# Patient Record
Sex: Female | Born: 1960 | Race: White | Hispanic: No | Marital: Married | Smoking: Never smoker
Health system: Southern US, Community
[De-identification: ages and names within clinical notes are randomized; demographics above are authoritative.]

## PROBLEM LIST (undated history)

## (undated) DIAGNOSIS — I10 Essential (primary) hypertension: Secondary | ICD-10-CM

## (undated) DIAGNOSIS — F32A Depression, unspecified: Secondary | ICD-10-CM

## (undated) DIAGNOSIS — K219 Gastro-esophageal reflux disease without esophagitis: Secondary | ICD-10-CM

## (undated) DIAGNOSIS — E669 Obesity, unspecified: Secondary | ICD-10-CM

## (undated) DIAGNOSIS — M5431 Sciatica, right side: Secondary | ICD-10-CM

## (undated) DIAGNOSIS — F329 Major depressive disorder, single episode, unspecified: Secondary | ICD-10-CM

## (undated) DIAGNOSIS — M1990 Unspecified osteoarthritis, unspecified site: Secondary | ICD-10-CM

## (undated) DIAGNOSIS — E119 Type 2 diabetes mellitus without complications: Secondary | ICD-10-CM

## (undated) DIAGNOSIS — F419 Anxiety disorder, unspecified: Secondary | ICD-10-CM

## (undated) DIAGNOSIS — G629 Polyneuropathy, unspecified: Secondary | ICD-10-CM

## (undated) DIAGNOSIS — G43909 Migraine, unspecified, not intractable, without status migrainosus: Secondary | ICD-10-CM

## (undated) DIAGNOSIS — E785 Hyperlipidemia, unspecified: Secondary | ICD-10-CM

## (undated) DIAGNOSIS — R011 Cardiac murmur, unspecified: Secondary | ICD-10-CM

## (undated) HISTORY — DX: Type 2 diabetes mellitus without complications: E11.9

## (undated) HISTORY — DX: Sciatica, right side: M54.31

## (undated) HISTORY — PX: KNEE SURGERY: SHX244

## (undated) HISTORY — DX: Migraine, unspecified, not intractable, without status migrainosus: G43.909

## (undated) HISTORY — DX: Major depressive disorder, single episode, unspecified: F32.9

## (undated) HISTORY — DX: Depression, unspecified: F32.A

## (undated) HISTORY — PX: NASAL SEPTOPLASTY W/ TURBINOPLASTY: SHX2070

## (undated) HISTORY — DX: Obesity, unspecified: E66.9

## (undated) HISTORY — DX: Hyperlipidemia, unspecified: E78.5

## (undated) HISTORY — DX: Unspecified osteoarthritis, unspecified site: M19.90

## (undated) HISTORY — DX: Anxiety disorder, unspecified: F41.9

---

## 1997-10-04 ENCOUNTER — Other Ambulatory Visit: Admission: RE | Admit: 1997-10-04 | Discharge: 1997-10-04 | Payer: Self-pay | Admitting: Obstetrics and Gynecology

## 2010-11-04 ENCOUNTER — Emergency Department (HOSPITAL_COMMUNITY)
Admission: EM | Admit: 2010-11-04 | Discharge: 2010-11-05 | Disposition: A | Discharge status: Home / Self Care | Payer: BC Managed Care – PPO | Attending: Emergency Medicine | Admitting: Emergency Medicine

## 2010-11-04 DIAGNOSIS — R109 Unspecified abdominal pain: Secondary | ICD-10-CM | POA: Insufficient documentation

## 2010-11-04 DIAGNOSIS — N200 Calculus of kidney: Secondary | ICD-10-CM | POA: Insufficient documentation

## 2010-11-04 DIAGNOSIS — K7689 Other specified diseases of liver: Secondary | ICD-10-CM | POA: Insufficient documentation

## 2010-11-04 DIAGNOSIS — K449 Diaphragmatic hernia without obstruction or gangrene: Secondary | ICD-10-CM | POA: Insufficient documentation

## 2010-11-05 ENCOUNTER — Emergency Department (HOSPITAL_COMMUNITY): Payer: BC Managed Care – PPO

## 2010-11-05 LAB — URINALYSIS, ROUTINE W REFLEX MICROSCOPIC
Bilirubin Urine: NEGATIVE
Ketones, ur: NEGATIVE mg/dL
Nitrite: NEGATIVE
Protein, ur: NEGATIVE mg/dL
Specific Gravity, Urine: >1.03 — ABNORMAL HIGH (ref 1.005–1.030)
Urobilinogen, UA: 0.2 mg/dL (ref 0.0–1.0)

## 2010-11-05 LAB — URINE MICROSCOPIC-ADD ON

## 2010-11-05 LAB — PREGNANCY, URINE: Preg Test, Ur: NEGATIVE

## 2010-11-06 ENCOUNTER — Emergency Department (HOSPITAL_COMMUNITY)
Admission: EM | Admit: 2010-11-06 | Discharge: 2010-11-06 | Disposition: A | Discharge status: Home / Self Care | Payer: BC Managed Care – PPO | Attending: Emergency Medicine | Admitting: Emergency Medicine

## 2010-11-06 ENCOUNTER — Emergency Department (HOSPITAL_COMMUNITY): Payer: BC Managed Care – PPO

## 2010-11-06 DIAGNOSIS — N200 Calculus of kidney: Secondary | ICD-10-CM | POA: Insufficient documentation

## 2010-11-06 DIAGNOSIS — R109 Unspecified abdominal pain: Secondary | ICD-10-CM | POA: Insufficient documentation

## 2010-11-06 DIAGNOSIS — N12 Tubulo-interstitial nephritis, not specified as acute or chronic: Secondary | ICD-10-CM | POA: Insufficient documentation

## 2010-11-06 DIAGNOSIS — I10 Essential (primary) hypertension: Secondary | ICD-10-CM | POA: Insufficient documentation

## 2010-11-06 DIAGNOSIS — K7689 Other specified diseases of liver: Secondary | ICD-10-CM | POA: Insufficient documentation

## 2010-11-06 LAB — CBC
HCT: 39.3 % (ref 36.0–46.0)
Hemoglobin: 12.7 g/dL (ref 12.0–15.0)
MCH: 29.4 pg (ref 26.0–34.0)
MCHC: 32.3 g/dL (ref 30.0–36.0)
MCV: 91 fL (ref 78.0–100.0)
Platelets: 219 10*3/uL (ref 150–400)
RBC: 4.32 MIL/uL (ref 3.87–5.11)
RDW: 14.1 % (ref 11.5–15.5)
WBC: 18 K/uL — ABNORMAL HIGH (ref 4.0–10.5)

## 2010-11-06 LAB — BASIC METABOLIC PANEL
CO2: 25 mEq/L (ref 19–32)
Calcium: 9.4 mg/dL (ref 8.4–10.5)
Chloride: 101 mEq/L (ref 96–112)
Creatinine, Ser: 0.93 mg/dL (ref 0.4–1.2)
GFR calc Af Amer: >60 mL/min (ref >60)
Sodium: 134 mEq/L — ABNORMAL LOW (ref 135–145)

## 2010-11-06 LAB — URINE MICROSCOPIC-ADD ON

## 2010-11-06 LAB — URINALYSIS, ROUTINE W REFLEX MICROSCOPIC
Glucose, UA: NEGATIVE mg/dL
Specific Gravity, Urine: 1.03 (ref 1.005–1.030)
pH: 5.5 (ref 5.0–8.0)

## 2010-11-06 LAB — BASIC METABOLIC PANEL WITH GFR
BUN: 14 mg/dL (ref 6–23)
GFR calc non Af Amer: >60 mL/min (ref >60)
Glucose, Bld: 120 mg/dL — ABNORMAL HIGH (ref 70–99)
Potassium: 4.4 meq/L (ref 3.5–5.1)

## 2010-11-06 LAB — DIFFERENTIAL
Basophils Absolute: 0 10*3/uL (ref 0.0–0.1)
Basophils Relative: 0 % (ref 0–1)
Eosinophils Absolute: 0.1 10*3/uL (ref 0.0–0.7)
Eosinophils Relative: 0 % (ref 0–5)
Lymphocytes Relative: 10 % — ABNORMAL LOW (ref 12–46)
Lymphs Abs: 1.7 K/uL (ref 0.7–4.0)
Monocytes Absolute: 1.1 K/uL — ABNORMAL HIGH (ref 0.1–1.0)
Monocytes Relative: 6 % (ref 3–12)
Neutro Abs: 15.1 10*3/uL — ABNORMAL HIGH (ref 1.7–7.7)
Neutrophils Relative %: 84 % — ABNORMAL HIGH (ref 43–77)

## 2010-11-08 LAB — URINE CULTURE
Colony Count: >=100000
Culture  Setup Time: 201205302038

## 2011-05-04 ENCOUNTER — Ambulatory Visit: Payer: PRIVATE HEALTH INSURANCE | Attending: Family Medicine | Admitting: Sleep Medicine

## 2011-05-04 DIAGNOSIS — Z6841 Body Mass Index (BMI) 40.0 and over, adult: Secondary | ICD-10-CM | POA: Insufficient documentation

## 2011-05-04 DIAGNOSIS — G4730 Sleep apnea, unspecified: Secondary | ICD-10-CM

## 2011-05-04 DIAGNOSIS — G4733 Obstructive sleep apnea (adult) (pediatric): Secondary | ICD-10-CM | POA: Insufficient documentation

## 2011-05-10 NOTE — Procedures (Signed)
NAMEDELLIE, PIASECKI               ACCOUNT NO.:  [id number]  MEDICAL RECORD NO.:  [id number]          PATIENT TYPE:  OUT  LOCATION:  SLEEP LAB                     FACILITY:  [facility]  PHYSICIAN:  Sharlon Pfohl A. Gerilyn Pilgrim, M.D. DATE OF BIRTH:  1960/09/14  DATE OF STUDY:  05/04/2011                           NOCTURNAL POLYSOMNOGRAM  REFERRING PHYSICIAN:  Claude Manges, MD  REFERRING PHYSICIAN:  Georges Mouse II, MD.  INDICATIONS:  A 50 year old, who presents with fatigue, hypersomnia, and snoring.  The study is being done to evaluate for obstructive sleep apnea syndrome.  MEDICATIONS:  Norvasc and benazepril.  EPWORTH SLEEPINESS SCALE:  14.  BMI:  45.  ARCHITECTURAL SUMMARY:  The total recording time is 490 minutes.  Sleep efficiency 89%.  Sleep latency 19.5 minutes.  REM latency 124.5 minutes. Stage N1 6.8%, N2 57% N3 18% and REM sleep also 18%.  RESPIRATORY SUMMARY:  Baseline oxygen saturation is 98, lowest saturation 87 during REM sleep.  Diagnostic AHI 6 and RDI also 6.  Most of the events occur during REM sleep with a REM AHI being 25.  LIMB MOVEMENT SUMMARY:  PLM index 0.  ELECTROCARDIOGRAM SUMMARY:  Average heart rate is 77 with no significant dysrhythmias observed.  IMPRESSION:  Mild obstructive sleep apnea syndrome, not requiring positive pressure treatment.  Thanks for this referral.    Abigale Dorow A. Gerilyn Pilgrim, M.D.    KAD/MEDQ  D:  05/09/2011 08:45:16  T:  05/10/2011 00:44:23  Job:  [id number]

## 2011-08-23 ENCOUNTER — Encounter (HOSPITAL_COMMUNITY): Payer: Self-pay | Admitting: *Deleted

## 2011-08-23 ENCOUNTER — Emergency Department (HOSPITAL_COMMUNITY): Payer: Worker's Compensation

## 2011-08-23 ENCOUNTER — Emergency Department (HOSPITAL_COMMUNITY)
Admission: EM | Admit: 2011-08-23 | Discharge: 2011-08-23 | Disposition: A | Discharge status: Home / Self Care | Payer: Worker's Compensation | Attending: Emergency Medicine | Admitting: Emergency Medicine

## 2011-08-23 DIAGNOSIS — Y990 Civilian activity done for income or pay: Secondary | ICD-10-CM | POA: Insufficient documentation

## 2011-08-23 DIAGNOSIS — S61209A Unspecified open wound of unspecified finger without damage to nail, initial encounter: Secondary | ICD-10-CM | POA: Insufficient documentation

## 2011-08-23 DIAGNOSIS — S61219A Laceration without foreign body of unspecified finger without damage to nail, initial encounter: Secondary | ICD-10-CM

## 2011-08-23 DIAGNOSIS — I10 Essential (primary) hypertension: Secondary | ICD-10-CM | POA: Insufficient documentation

## 2011-08-23 DIAGNOSIS — W278XXA Contact with other nonpowered hand tool, initial encounter: Secondary | ICD-10-CM | POA: Insufficient documentation

## 2011-08-23 DIAGNOSIS — Z79899 Other long term (current) drug therapy: Secondary | ICD-10-CM | POA: Insufficient documentation

## 2011-08-23 HISTORY — DX: Essential (primary) hypertension: I10

## 2011-08-23 MED ORDER — TETANUS-DIPHTH-ACELL PERTUSSIS 5-2.5-18.5 LF-MCG/0.5 IM SUSP
0.5000 mL | Freq: Once | INTRAMUSCULAR | Status: AC
  Administered 2011-08-23: 0.5 mL via INTRAMUSCULAR
  Filled 2011-08-23: qty 0.5

## 2011-08-23 NOTE — ED Notes (Signed)
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  No active bleeding huge bandage intact

## 2011-08-23 NOTE — ED Notes (Signed)
Patient complaining of a laceration on right pointer finger; patient states that she cut her finger on electric scissors at around around 0230 tonight.  Small laceration noted at tip of finger; no active bleeding noted.  Saline-soaked gauze placed on top of laceration.  Patient able to move all extremities without difficulty.  Patient rates pain 3/10 on the numerical pain scale; describes pain as "stinging".  Patient does not know the date of her last tetanus shot.  Patient alert and oriented x4; PERRL present.  Will continue to monitor.

## 2011-08-23 NOTE — ED Notes (Signed)
Lab at bedside for urine drug screen.

## 2011-08-23 NOTE — ED Notes (Signed)
Patient currently sitting up in bed; no respiratory or acute distress noted.  Patient updated on plan of care; informed patient that nurse practitioner will be in with dermabond to close laceration; patient has no other questions or concerns; will continue to monitor.

## 2011-08-23 NOTE — Discharge Instructions (Signed)
Please review the instructions below. The xray of your fingertip shows no foreign bodies or broken bones. We have repaired the small laceration with a tissue glue and steri-strips. Leave current dressing in place for 24 hours then remove and shower has usual. Keep covered, especially at work. Return to your primary doctor or to the emergency room if there are any concerns the wound is becoming infected.  Fingertip Laceration The treatment of fingertip injuries depends on how large the cut is and whether the bone or nail tissue has been damaged. Amputations of the skin over the tip of the finger that is smaller than a dime (smaller than 1cm) will usually heal very well from the sides without any treatment other than cleaning the wound and changing the dressing. Keep your hand elevated for the next 2 to 3 days to reduce pain and swelling. A splint over the fingertip may be needed to protect your injury. If your cut is being allowed to heal in from the sides, you should soak it in warm water and change the dressing daily.  You may need a tetanus shot if:  You cannot remember when you had your last tetanus shot.   You have never had a tetanus shot.   The injury broke your skin.  If you got a tetanus shot, your arm may swell, get red, and feel warm to the touch. This is common and not a problem. If you need a tetanus shot and you choose not to have one, there is a rare chance of getting tetanus. Sickness from tetanus can be serious. SEEK MEDICAL CARE IF:   There are any signs of infection: increased redness, swelling, and pain, or sometimes pus drainage.  Document Released: 07/03/2004 Document Revised: 05/15/2011 Document Reviewed: 06/29/2008 [facility] Patient Information 2012 [place], [place].

## 2011-08-23 NOTE — ED Notes (Signed)
Another dermabond removed from supply pyxis and given to [place], [place], per request.

## 2011-08-23 NOTE — ED Notes (Signed)
Dr. Wickline at bedside.  

## 2011-08-23 NOTE — ED Notes (Signed)
Patient given discharge paperwork; went over discharge instructions with patient.  Instructed patient to keep dressing in place for 24 hours, to shower normally afterwards, to keep wound clean and dry (covered, especially at work), and to follow up with primary care physician/ED for new, worsening, or concerning symptoms (i.e. Infection).

## 2011-08-23 NOTE — ED Notes (Signed)
Dermabond and steri-strips applied by Natalia Leatherwood, FNP.

## 2011-08-23 NOTE — ED Provider Notes (Signed)
History     CSN: [id number]  Arrival date & time 08/23/11  4098   First MD Initiated Contact with Patient 08/23/11 0440      Chief Complaint  Patient presents with  . Extremity Laceration    HPI: Patient is a 51 y.o. female presenting with skin laceration. The history is provided by the patient.  Laceration  The incident occurred 3 to 5 hours ago. The laceration is located on the right hand. The laceration is 1 cm in size. The pain is at a severity of 3/10. The pain is mild. The pain has been constant since onset. It is unknown if a foreign body is present. Her tetanus status is out of date.   patient reports that at approximately 2:45 this morning she cut the tip of her right index finger while using electric scissors at work. States that she feels like the scissors went very deep. Tetanus status unknown.  Past Medical History  Diagnosis Date  . Hypertension     History reviewed. No pertinent past surgical history.  History reviewed. No pertinent family history.  History  Substance Use Topics  . Smoking status: Never Smoker   . Smokeless tobacco: Not on file  . Alcohol Use: No    OB History    Grav Para Term Preterm Abortions TAB SAB Ect Mult Living                  Review of Systems  Constitutional: Negative.   HENT: Negative.   Eyes: Negative.   Respiratory: Negative.   Cardiovascular: Negative.   Gastrointestinal: Negative.   Genitourinary: Negative.   Musculoskeletal: Negative.   Skin: Negative.   Neurological: Negative.   Hematological: Negative.   Psychiatric/Behavioral: Negative.     Allergies  Review of patient's allergies indicates no known allergies.  Home Medications   Current Outpatient Rx  Name Route Sig Dispense Refill  . ASPIRIN-ACETAMINOPHEN-CAFFEINE 250-250-65 MG PO TABS Oral Take 1 tablet by mouth every 6 (six) hours as needed. For pain    . LISINOPRIL 10 MG PO TABS Oral Take 10 mg by mouth daily.      BP 173/96  Pulse 95   Temp(Src) 98.2 F (36.8 C) (Oral)  Resp 20  SpO2 96%  Physical Exam  Constitutional: She is oriented to person, place, and time. She appears well-developed and well-nourished.  HENT:  Head: Normocephalic and atraumatic.  Eyes: Conjunctivae are normal.  Cardiovascular: Normal rate and regular rhythm.   Pulmonary/Chest: Effort normal and breath sounds normal.  Abdominal: Soft. Bowel sounds are normal.  Musculoskeletal: Normal range of motion.       Small laceration that extends anterior/posterior from (but not including) the nailbed of distal (R) index fingertip. No active bleeding.  Neurological: She is alert and oriented to person, place, and time.  Skin: Skin is warm and dry. No erythema.  Psychiatric: She has a normal mood and affect.    ED Course  LACERATION REPAIR Date/Time: 08/23/2011 6:20 AM Performed by: Leanne Chang Authorized by: Leanne Chang Consent: Verbal consent obtained. Risks and benefits: risks, benefits and alternatives were discussed Consent given by: patient Patient understanding: patient states understanding of the procedure being performed Required items: required blood products, implants, devices, and special equipment available Patient identity confirmed: verbally with patient Body area: upper extremity Location details: right index finger Laceration length: 1 cm Foreign bodies: no foreign bodies Tendon involvement: none Nerve involvement: none Vascular damage: no Anesthetic total: 0 ml Irrigation solution:  saline Irrigation method: syringe Amount of cleaning: standard Debridement: none Degree of undermining: none Skin closure: Steri-Strips and glue Approximation: close Approximation difficulty: simple Dressing: pressure dressing Patient tolerance: Patient tolerated the procedure well with no immediate complications.       Labs Reviewed - No data to display No results found.   No diagnosis found.    MDM  HPI/PE and  clinical findings c/w 1. Fingertip laceration  (Xray shows no foreign bodies or fx, CMS intact,  T-Dap updated, repaired w/ derma-bond and steri strips)     Leanne Chang, NP 08/23/11 4540  Roma Kayser Shivaay Stormont, NP 08/23/11 [phone]

## 2011-08-23 NOTE — ED Notes (Signed)
Patient updated on plan of care; informed patient that lab is on their way to perform urine drug screen for worker's comp; patient has no other questions or concerns at this time; will continue to monitor.

## 2011-08-23 NOTE — ED Notes (Addendum)
Wound cleaned.

## 2011-08-23 NOTE — ED Notes (Signed)
FNP at bedside.

## 2011-08-25 NOTE — ED Provider Notes (Signed)
Medical screening examination/treatment/procedure(s) were conducted as a shared visit with non-physician practitioner(s) and myself.  I personally evaluated the patient during the encounter  Pt well appearing Advised use of skin glue Xray reviewed  Joya Gaskins, MD 08/25/11 [phone]

## 2012-11-09 ENCOUNTER — Emergency Department (HOSPITAL_COMMUNITY)
Admission: EM | Admit: 2012-11-09 | Discharge: 2012-11-09 | Disposition: A | Discharge status: Home / Self Care | Payer: PRIVATE HEALTH INSURANCE | Attending: Emergency Medicine | Admitting: Emergency Medicine

## 2012-11-09 ENCOUNTER — Encounter (HOSPITAL_COMMUNITY): Payer: Self-pay

## 2012-11-09 DIAGNOSIS — I10 Essential (primary) hypertension: Secondary | ICD-10-CM | POA: Insufficient documentation

## 2012-11-09 DIAGNOSIS — R112 Nausea with vomiting, unspecified: Secondary | ICD-10-CM | POA: Insufficient documentation

## 2012-11-09 DIAGNOSIS — Z79899 Other long term (current) drug therapy: Secondary | ICD-10-CM | POA: Insufficient documentation

## 2012-11-09 DIAGNOSIS — H5710 Ocular pain, unspecified eye: Secondary | ICD-10-CM | POA: Insufficient documentation

## 2012-11-09 DIAGNOSIS — M542 Cervicalgia: Secondary | ICD-10-CM | POA: Insufficient documentation

## 2012-11-09 DIAGNOSIS — H9209 Otalgia, unspecified ear: Secondary | ICD-10-CM | POA: Insufficient documentation

## 2012-11-09 DIAGNOSIS — R51 Headache: Secondary | ICD-10-CM | POA: Insufficient documentation

## 2012-11-09 DIAGNOSIS — IMO0002 Reserved for concepts with insufficient information to code with codable children: Secondary | ICD-10-CM | POA: Insufficient documentation

## 2012-11-09 DIAGNOSIS — G43909 Migraine, unspecified, not intractable, without status migrainosus: Secondary | ICD-10-CM | POA: Insufficient documentation

## 2012-11-09 MED ORDER — METOCLOPRAMIDE HCL 5 MG/ML IJ SOLN
10.0000 mg | Freq: Once | INTRAMUSCULAR | Status: AC
  Administered 2012-11-09: 10 mg via INTRAVENOUS
  Filled 2012-11-09: qty 2

## 2012-11-09 MED ORDER — ONDANSETRON HCL 4 MG/2ML IJ SOLN
4.0000 mg | Freq: Once | INTRAMUSCULAR | Status: AC
  Administered 2012-11-09: 4 mg via INTRAVENOUS
  Filled 2012-11-09: qty 2

## 2012-11-09 MED ORDER — TRAMADOL HCL 50 MG PO TABS: 50.0000 mg | ORAL_TABLET | Freq: Four times a day (QID) | ORAL | Status: DC | PRN

## 2012-11-09 MED ORDER — KETOROLAC TROMETHAMINE 30 MG/ML IJ SOLN
30.0000 mg | Freq: Once | INTRAMUSCULAR | Status: AC
  Administered 2012-11-09: 30 mg via INTRAVENOUS
  Filled 2012-11-09: qty 1

## 2012-11-09 NOTE — ED Notes (Signed)
MD at bedside. 

## 2012-11-09 NOTE — ED Notes (Signed)
Pt with HA after getting upset this morning, N/V x 1 on way here per pt., denies nausea at this time, hx of migraines but per pt not this bad, also c/o earache

## 2012-11-09 NOTE — ED Notes (Signed)
Pt crying at triage desk

## 2012-11-09 NOTE — ED Provider Notes (Signed)
History    This chart was scribed for Nancy Lennert, MD by Quintella Reichert, ED scribe.  This patient was seen in room [facility] and the patient's care was started at 2:17 PM.   CSN: [id number]  Arrival date & time 11/09/12  1325      Chief Complaint  Patient presents with  . Headache     Patient is a 52 y.o. female presenting with headaches. The history is provided by the patient. No language interpreter was used.  Headache Pain location:  Generalized Radiates to:  Face, L neck and R neck Pain severity now: "the worst I've ever had" Duration:  3 hours Timing:  Constant Context: emotional stress   Relieved by:  None tried Worsened by:  Nothing tried Ineffective treatments:  None tried Associated symptoms: ear pain, eye pain, nausea, neck pain and vomiting   Associated symptoms: no abdominal pain, no back pain, no congestion, no cough, no diarrhea, no fatigue, no seizures and no sinus pressure     HPI Comments: Nancy Oliver is a 52 y.o. female who presents to the Emergency Department complaining of severe, constant headache that began 3 hours ago subsequent to emotional stress, with accompanying nausea and 1 episode of emesis.  Pt has h/o migraines but states this is the worst headache she has ever had.  She also reports pain to the neck, ears, and eyes.  She normally treats migraines with Excedrin but did not take any today.  Pt medicates regularly with lisinopril 10 mg for HTN.   Past Medical History  Diagnosis Date  . Hypertension     History reviewed. No pertinent past surgical history.  No family history on file.  History  Substance Use Topics  . Smoking status: Never Smoker   . Smokeless tobacco: Not on file  . Alcohol Use: No    OB History   Grav Para Term Preterm Abortions TAB SAB Ect Mult Living                  Review of Systems  Constitutional: Negative for appetite change and fatigue.  HENT: Positive for ear pain and neck pain. Negative for  congestion, sinus pressure and ear discharge.   Eyes: Positive for pain. Negative for discharge.  Respiratory: Negative for cough.   Cardiovascular: Negative for chest pain.  Gastrointestinal: Positive for nausea and vomiting. Negative for abdominal pain and diarrhea.  Genitourinary: Negative for frequency and hematuria.  Musculoskeletal: Negative for back pain.  Skin: Negative for rash.  Neurological: Positive for headaches. Negative for seizures.  Psychiatric/Behavioral: Negative for hallucinations.    Allergies  Review of patient's allergies indicates no known allergies.  Home Medications   Current Outpatient Rx  Name  Route  Sig  Dispense  Refill  . aspirin-acetaminophen-caffeine (EXCEDRIN MIGRAINE) 250-250-65 MG per tablet   Oral   Take 1 tablet by mouth every 6 (six) hours as needed. For pain         . lisinopril (PRINIVIL,ZESTRIL) 10 MG tablet   Oral   Take 10 mg by mouth daily.         . predniSONE (DELTASONE) 10 MG tablet   Oral   Take 10 mg by mouth daily.           BP 190/116  Pulse 95  Temp(Src) 97.4 F (36.3 C) (Oral)  Resp 20  Ht 5\' 5"  (1.651 m)  Wt 270 lb (122.471 kg)  BMI 44.93 kg/m2  SpO2 98%  LMP 10/07/2012  Physical Exam  Nursing note and vitals reviewed. Constitutional: She is oriented to person, place, and time. She appears well-developed.  HENT:  Head: Normocephalic.  Eyes: Conjunctivae and EOM are normal. No scleral icterus.  Neck: Neck supple. No thyromegaly present.  Cardiovascular: Normal rate and regular rhythm.  Exam reveals no gallop and no friction rub.   No murmur heard. Pulmonary/Chest: No stridor. She has no wheezes. She has no rales. She exhibits no tenderness.  Abdominal: She exhibits no distension. There is no tenderness. There is no rebound.  Musculoskeletal: Normal range of motion. She exhibits no edema.  Lymphadenopathy:    She has no cervical adenopathy.  Neurological: She is oriented to person, place, and time.  Coordination normal.  Skin: No rash noted. No erythema.  Psychiatric: She has a normal mood and affect. Her behavior is normal.    ED Course  Procedures (including critical care time)  DIAGNOSTIC STUDIES: Oxygen Saturation is 98% on room air, normal by my interpretation.    COORDINATION OF CARE: 2:19 PM-Discussed treatment plan which includes pain medication and anti-emetics with pt at bedside and pt agreed to plan.      Labs Reviewed - No data to display No results found.   No diagnosis found.    MDM  Pt improved with tx for headache      The chart was scribed for me under my direct supervision.  I personally performed the history, physical, and medical decision making and all procedures in the evaluation of this patient.Nancy Lennert, MD 11/09/12 [phone]

## 2012-11-09 NOTE — ED Notes (Signed)
Pt states she got upset this morning. Has a headache now

## 2012-11-09 NOTE — ED Notes (Signed)
Patient with no complaints at this time. Respirations even and unlabored. Skin warm/dry. Discharge instructions reviewed with patient at this time. Patient given opportunity to voice concerns/ask questions. Patient discharged at this time and left Emergency Department with steady gait.   

## 2013-01-24 ENCOUNTER — Other Ambulatory Visit: Payer: Self-pay | Admitting: Family Medicine

## 2013-01-24 NOTE — Telephone Encounter (Signed)
Need LOV

## 2013-01-24 NOTE — Telephone Encounter (Signed)
.?   OK to Refill   LOV 06/28/12

## 2013-01-25 NOTE — Telephone Encounter (Signed)
RX called in.  Left pt mess that will appt before next refill due.

## 2013-01-25 NOTE — Telephone Encounter (Signed)
Can have 1 month supply to make OV.  Last refill.

## 2013-06-07 ENCOUNTER — Telehealth: Payer: Self-pay | Admitting: Family Medicine

## 2013-06-07 NOTE — Telephone Encounter (Signed)
Pharmacy is [facility] in [place] Call back number is [phone] Pt is needing a refill on lisinopril

## 2013-06-08 MED ORDER — LISINOPRIL 10 MG PO TABS: 10.0000 mg | ORAL_TABLET | Freq: Every day | ORAL | Status: DC

## 2013-06-08 NOTE — Telephone Encounter (Signed)
Rx Refilled  

## 2013-06-17 ENCOUNTER — Ambulatory Visit (INDEPENDENT_AMBULATORY_CARE_PROVIDER_SITE_OTHER): Payer: 59 | Admitting: Family Medicine

## 2013-06-17 ENCOUNTER — Encounter: Payer: Self-pay | Admitting: Family Medicine

## 2013-06-17 VITALS — BP 150/100 | HR 84 | Temp 97.0°F | Resp 16 | Ht 65.0 in | Wt 274.0 lb

## 2013-06-17 DIAGNOSIS — F411 Generalized anxiety disorder: Secondary | ICD-10-CM

## 2013-06-17 DIAGNOSIS — I10 Essential (primary) hypertension: Secondary | ICD-10-CM

## 2013-06-17 DIAGNOSIS — M5431 Sciatica, right side: Secondary | ICD-10-CM | POA: Insufficient documentation

## 2013-06-17 LAB — COMPLETE METABOLIC PANEL WITH GFR
ALT: 12 U/L (ref 0–35)
AST: 14 U/L (ref 0–37)
Albumin: 3.8 g/dL (ref 3.5–5.2)
Alkaline Phosphatase: 79 U/L (ref 39–117)
BILIRUBIN TOTAL: 0.3 mg/dL (ref 0.3–1.2)
BUN: 17 mg/dL (ref 6–23)
CO2: 23 meq/L (ref 19–32)
Calcium: 9.2 mg/dL (ref 8.4–10.5)
Chloride: 104 mEq/L (ref 96–112)
Creat: 0.67 mg/dL (ref 0.50–1.10)
GFR, Est Non African American: >89 mL/min
Glucose, Bld: 102 mg/dL — ABNORMAL HIGH (ref 70–99)
Potassium: 4.8 mEq/L (ref 3.5–5.3)
Sodium: 138 mEq/L (ref 135–145)
Total Protein: 6.7 g/dL (ref 6.0–8.3)

## 2013-06-17 LAB — CBC WITH DIFFERENTIAL/PLATELET
BASOS PCT: 1 % (ref 0–1)
Basophils Absolute: 0.1 10*3/uL (ref 0.0–0.1)
EOS ABS: 1.1 10*3/uL — AB (ref 0.0–0.7)
EOS PCT: 11 % — AB (ref 0–5)
HEMATOCRIT: 39.1 % (ref 36.0–46.0)
Hemoglobin: 13.4 g/dL (ref 12.0–15.0)
Lymphocytes Relative: 30 % (ref 12–46)
Lymphs Abs: 2.9 10*3/uL (ref 0.7–4.0)
MCH: 29.2 pg (ref 26.0–34.0)
MCHC: 34.3 g/dL (ref 30.0–36.0)
MCV: 85.2 fL (ref 78.0–100.0)
MONO ABS: 0.6 10*3/uL (ref 0.1–1.0)
MONOS PCT: 7 % (ref 3–12)
Neutro Abs: 5.2 10*3/uL (ref 1.7–7.7)
Neutrophils Relative %: 51 % (ref 43–77)
Platelets: 286 10*3/uL (ref 150–400)
RBC: 4.59 MIL/uL (ref 3.87–5.11)
RDW: 14.3 % (ref 11.5–15.5)
WBC: 9.8 10*3/uL (ref 4.0–10.5)

## 2013-06-17 LAB — LIPID PANEL
Cholesterol: 189 mg/dL (ref 0–200)
HDL: 56 mg/dL (ref >39)
LDL CALC: 117 mg/dL — AB (ref 0–99)
TRIGLYCERIDES: 82 mg/dL (ref <150)
Total CHOL/HDL Ratio: 3.4 Ratio
VLDL: 16 mg/dL (ref 0–40)

## 2013-06-17 MED ORDER — BENAZEPRIL-HYDROCHLOROTHIAZIDE 20-12.5 MG PO TABS: 2.0000 | ORAL_TABLET | Freq: Every day | ORAL | Status: DC

## 2013-06-17 MED ORDER — ALPRAZOLAM 0.5 MG PO TABS: ORAL_TABLET | ORAL | Status: AC

## 2013-06-17 MED ORDER — ALPRAZOLAM 0.5 MG PO TABS: ORAL_TABLET | ORAL | Status: DC

## 2013-06-17 NOTE — Progress Notes (Signed)
Subjective:    Patient ID: Nancy Oliver, female    DOB: Apr 22, 1961, 53 y.o.   MRN: [id number]  HPI Patient is a 53 year old white female who presents today because I would not refill her Xanax without office visit. She has not been seen since January of 2014.  She rarely takes Xanax. She uses it sparingly as needed for anxiety. Her biggest problem now has been low back pain with right-sided sciatica. She is seeing orthopedics. They have put her on gabapentin 300 mg by mouth each bedtime with little benefit. She also has hypertension. She's been taking lisinopril 10 mg by mouth daily. Her blood pressure today is 150/100. She denies any chest pain, shortness of breath, dyspnea exertion. She is overdue for a mammogram, colonoscopy, and Pap smear Past Medical History  Diagnosis Date  . Hypertension   . Anxiety   . Depression   . Arthritis   . Right sided sciatica   . Obesity   . Sleep apnea     mild  . Migraines    Past Surgical History  Procedure Laterality Date  . Cesarean section    . Nasal septoplasty w/ turbinoplasty     Current Outpatient Prescriptions on File Prior to Visit  Medication Sig Dispense Refill  . aspirin-acetaminophen-caffeine (EXCEDRIN MIGRAINE) 250-250-65 MG per tablet Take 1 tablet by mouth every 6 (six) hours as needed. For pain       No current facility-administered medications on file prior to visit.   No Known Allergies History   Social History  . Marital Status: Married    Spouse Name: N/A    Number of Children: N/A  . Years of Education: N/A   Occupational History  . Not on file.   Social History Main Topics  . Smoking status: Never Smoker   . Smokeless tobacco: Not on file  . Alcohol Use: No  . Drug Use:   . Sexual Activity:    Other Topics Concern  . Not on file   Social History Narrative  . No narrative on file      Review of Systems  All other systems reviewed and are negative.       Objective:   Physical Exam  Vitals  reviewed. Neck: Neck supple. No JVD present. No thyromegaly present.  Cardiovascular: Normal rate, regular rhythm, normal heart sounds and intact distal pulses.  Exam reveals no gallop and no friction rub.   No murmur heard. Pulmonary/Chest: Effort normal and breath sounds normal. No respiratory distress. She has no wheezes. She has no rales. She exhibits no tenderness.  Abdominal: Soft. Bowel sounds are normal. She exhibits no distension and no mass. There is no tenderness. There is no rebound and no guarding.  Lymphadenopathy:    She has no cervical adenopathy.          Assessment & Plan:  1. HTN (hypertension) Discontinue lisinopril. Begin Lotensin HCT 20/12.52 tablets by mouth daily and recheck blood pressure in 2 weeks. Also check a CMP, fasting lipid panel. - benazepril-hydrochlorthiazide (LOTENSIN HCT) 20-12.5 MG per tablet; Take 2 tablets by mouth daily.  Dispense: 60 tablet; Refill: 5 - COMPLETE METABOLIC PANEL WITH GFR - Lipid panel - CBC with Differential  2. GAD (generalized anxiety disorder) Gladly refill the patient Xanax 0.5 mg by mouth every 8 hours as needed for anxiety. 60 tablet usually last this patient 6-12 months.   I also recommended a colonoscopy, mammogram, and a Pap smear. The patient like to defer these for  now until her back is better. I instructed her to call me when she was rescheduled these procedures. I also recommended she increase gabapentin 300 mg by mouth 3 times a day and notify me to see if this is helping her sciatica.

## 2013-06-20 ENCOUNTER — Telehealth: Payer: Self-pay | Admitting: Family Medicine

## 2013-06-20 NOTE — Telephone Encounter (Signed)
Pharmacy [facility] in [place] Call back number is [phone] PT went to pharmacy and prescription is $50 for Benazepril she is wanting to know if something else could be called in

## 2013-06-21 ENCOUNTER — Telehealth: Payer: Self-pay | Admitting: Family Medicine

## 2013-06-21 MED ORDER — LOSARTAN POTASSIUM-HCTZ 100-12.5 MG PO TABS: 1.0000 | ORAL_TABLET | Freq: Every day | ORAL | Status: DC

## 2013-06-21 NOTE — Telephone Encounter (Signed)
Pt is calling because her BP pills are expensive she is recently got a new drug prescription plan and they went from being $4 to $60 and she is wanting to know if she can have something cheaper Call bck number is [phone]

## 2013-06-21 NOTE — Telephone Encounter (Signed)
swicth to hyzaar 100/12.5 mg poqday

## 2013-06-21 NOTE — Telephone Encounter (Signed)
Med sent to pharm and pt aware per vm

## 2013-06-28 ENCOUNTER — Encounter: Payer: Self-pay | Admitting: Family Medicine

## 2013-07-21 ENCOUNTER — Telehealth: Payer: Self-pay | Admitting: Family Medicine

## 2013-07-21 NOTE — Telephone Encounter (Signed)
She wants to know if Dr. Dennard Schaumann will give her anything to help with weight loss.  She needs knee replacement and they will not do until she loses weight

## 2013-07-22 NOTE — Telephone Encounter (Signed)
Mistake on Patient aware  - [facility]

## 2013-07-22 NOTE — Telephone Encounter (Signed)
Given her hypertension, she could try topamax 25 mg pobid as an appetite suppressant.

## 2013-07-22 NOTE — Telephone Encounter (Signed)
Patient aware.

## 2013-08-03 NOTE — Telephone Encounter (Signed)
No return call 

## 2013-10-17 ENCOUNTER — Other Ambulatory Visit: Payer: Self-pay | Admitting: Family Medicine

## 2013-11-01 ENCOUNTER — Ambulatory Visit: Payer: 59 | Admitting: Family Medicine

## 2013-11-03 ENCOUNTER — Ambulatory Visit: Payer: 59 | Admitting: Family Medicine

## 2014-03-15 ENCOUNTER — Other Ambulatory Visit: Payer: Self-pay

## 2014-03-15 DIAGNOSIS — Z1231 Encounter for screening mammogram for malignant neoplasm of breast: Secondary | ICD-10-CM

## 2014-03-29 ENCOUNTER — Ambulatory Visit
Admission: RE | Admit: 2014-03-29 | Discharge: 2014-03-29 | Disposition: A | Discharge status: Home / Self Care | Payer: 59 | Source: Ambulatory Visit

## 2014-03-29 DIAGNOSIS — Z1231 Encounter for screening mammogram for malignant neoplasm of breast: Secondary | ICD-10-CM

## 2014-03-31 ENCOUNTER — Other Ambulatory Visit: Payer: Self-pay | Admitting: Physician Assistant

## 2014-03-31 DIAGNOSIS — R928 Other abnormal and inconclusive findings on diagnostic imaging of breast: Secondary | ICD-10-CM

## 2014-04-09 HISTORY — PX: BREAST SURGERY: SHX581

## 2014-04-12 ENCOUNTER — Ambulatory Visit
Admission: RE | Admit: 2014-04-12 | Discharge: 2014-04-12 | Disposition: A | Discharge status: Home / Self Care | Payer: 59 | Source: Ambulatory Visit | Attending: Physician Assistant | Admitting: Physician Assistant

## 2014-04-12 ENCOUNTER — Other Ambulatory Visit: Payer: Self-pay | Admitting: Physician Assistant

## 2014-04-12 DIAGNOSIS — R928 Other abnormal and inconclusive findings on diagnostic imaging of breast: Secondary | ICD-10-CM

## 2014-04-17 ENCOUNTER — Other Ambulatory Visit: Payer: Self-pay | Admitting: Physician Assistant

## 2014-04-17 DIAGNOSIS — R928 Other abnormal and inconclusive findings on diagnostic imaging of breast: Secondary | ICD-10-CM

## 2014-04-20 ENCOUNTER — Ambulatory Visit
Admission: RE | Admit: 2014-04-20 | Discharge: 2014-04-20 | Disposition: A | Discharge status: Home / Self Care | Payer: 59 | Source: Ambulatory Visit | Attending: Physician Assistant | Admitting: Physician Assistant

## 2014-04-20 DIAGNOSIS — R928 Other abnormal and inconclusive findings on diagnostic imaging of breast: Secondary | ICD-10-CM

## 2014-09-04 ENCOUNTER — Other Ambulatory Visit: Payer: 59 | Admitting: Obstetrics and Gynecology

## 2014-09-12 ENCOUNTER — Ambulatory Visit (INDEPENDENT_AMBULATORY_CARE_PROVIDER_SITE_OTHER): Payer: 59 | Admitting: Obstetrics and Gynecology

## 2014-09-12 ENCOUNTER — Encounter: Payer: Self-pay | Admitting: Obstetrics and Gynecology

## 2014-09-12 ENCOUNTER — Other Ambulatory Visit (HOSPITAL_COMMUNITY)
Admission: RE | Admit: 2014-09-12 | Discharge: 2014-09-12 | Disposition: A | Discharge status: Home / Self Care | Payer: 59 | Source: Ambulatory Visit | Attending: Obstetrics and Gynecology | Admitting: Obstetrics and Gynecology

## 2014-09-12 VITALS — BP 132/86 | Ht 67.0 in | Wt 308.0 lb

## 2014-09-12 DIAGNOSIS — Z01419 Encounter for gynecological examination (general) (routine) without abnormal findings: Secondary | ICD-10-CM | POA: Diagnosis not present

## 2014-09-12 DIAGNOSIS — Z1151 Encounter for screening for human papillomavirus (HPV): Secondary | ICD-10-CM | POA: Diagnosis present

## 2014-09-12 NOTE — Progress Notes (Signed)
Patient ID: Nancy Oliver, female   DOB: 09-08-1960, 54 y.o.   MRN: [id number] Pt her today for annual exam. Pt wants to discuss hot flashes she is having. Pt state sthat she has a period usually every 3 months.   Assessment:  Annual Gyn Exam   Plan:  1. pap smear done, next pap due 85yr 2. return  95yr  or prn 3    Annual mammogram advised Subjective:  Nancy Oliver is a 54 y.o. female No obstetric history on file. who presents for annual exam. No LMP recorded. The patient has complaints today of wt gain, won't check wt herself, + Arthritis, candidate for knee replacement , on disability, not exercising,   The following portions of the patient's history were reviewed and updated as appropriate: allergies, current medications, past family history, past medical history, past social history, past surgical history and problem list. Past Medical History  Diagnosis Date  . Hypertension   . Anxiety   . Depression   . Arthritis   . Right sided sciatica   . Obesity   . Migraines   . Hyperlipidemia     Past Surgical History  Procedure Laterality Date  . Cesarean section    . Nasal septoplasty w/ turbinoplasty    . Breast surgery Left 04/2014    biopsy      Current outpatient prescriptions:  .  ALPRAZolam (XANAX) 0.5 MG tablet, TAKE ONE TABLET BY MOUTH EVERY 8 HOURS AS NEEDED, Disp: 60 tablet, Rfl: 0 .  aspirin-acetaminophen-caffeine (EXCEDRIN MIGRAINE) 761-607-37 MG per tablet, Take 1 tablet by mouth every 6 (six) hours as needed. For pain, Disp: , Rfl:  .  atorvastatin (LIPITOR) 20 MG tablet, Take 20 mg by mouth daily., Disp: , Rfl:  .  DULoxetine (CYMBALTA) 60 MG capsule, Take 60 mg by mouth daily., Disp: , Rfl:  .  losartan-hydrochlorothiazide (HYZAAR) 100-12.5 MG per tablet, Take 1 tablet by mouth daily., Disp: 30 tablet, Rfl: 3 .  omeprazole (PRILOSEC) 40 MG capsule, Take 40 mg by mouth daily., Disp: , Rfl:   Review of Systems Constitutional: negative Gastrointestinal:  negative Genitourinary: none  Objective:  BP 132/86 mmHg  Ht 5\' 7"  (1.702 m)  Wt 308 lb (139.708 kg)  BMI 48.23 kg/m2  LMP    BMI: Body mass index is 48.23 kg/(m^2).  General Appearance: Alert, appropriate appearance for age. No acute distress HEENT: Grossly normal Neck / Thyroid:  Cardiovascular: RRR; normal S1, S2, no murmur Lungs: CTA bilaterally Back: No CVAT Breast Exam: No masses or nodes.No dimpling, nipple retraction or discharge. Gastrointestinal: Soft, non-tender, no masses or organomegaly Pelvic Exam: Vulva and vagina appear normal. Bimanual exam reveals normal uterus and adnexa. Vaginal: normal mucosa without prolapse or lesions and generous mucus, clear Cervix: normal appearance Adnexa: limited by wt Rectovaginal: normal rectal, no masses Lymphatic Exam: Non-palpable nodes in neck, clavicular, axillary, or inguinal regions Skin: no rash or abnormalities Neurologic: Normal gait and speech, no tremor  Psychiatric: Alert and oriented, appropriate affect.  Urinalysis:Not done  Mallory Shirk. MD Pgr [phone] 3:05 PM

## 2014-09-14 LAB — CYTOLOGY - PAP

## 2014-10-10 ENCOUNTER — Other Ambulatory Visit: Payer: Self-pay | Admitting: Physician Assistant

## 2014-10-10 DIAGNOSIS — N6320 Unspecified lump in the left breast, unspecified quadrant: Secondary | ICD-10-CM

## 2014-10-23 ENCOUNTER — Ambulatory Visit
Admission: RE | Admit: 2014-10-23 | Discharge: 2014-10-23 | Disposition: A | Discharge status: Home / Self Care | Payer: 59 | Source: Ambulatory Visit | Attending: Physician Assistant | Admitting: Physician Assistant

## 2014-10-23 DIAGNOSIS — N6320 Unspecified lump in the left breast, unspecified quadrant: Secondary | ICD-10-CM

## 2014-10-30 ENCOUNTER — Other Ambulatory Visit: Payer: Self-pay | Admitting: Gastroenterology

## 2015-03-21 ENCOUNTER — Other Ambulatory Visit: Payer: Self-pay

## 2015-03-21 DIAGNOSIS — Z1231 Encounter for screening mammogram for malignant neoplasm of breast: Secondary | ICD-10-CM

## 2015-04-24 ENCOUNTER — Ambulatory Visit
Admission: RE | Admit: 2015-04-24 | Discharge: 2015-04-24 | Disposition: A | Discharge status: Home / Self Care | Payer: 59 | Source: Ambulatory Visit

## 2015-04-24 DIAGNOSIS — Z1231 Encounter for screening mammogram for malignant neoplasm of breast: Secondary | ICD-10-CM

## 2015-04-26 ENCOUNTER — Other Ambulatory Visit: Payer: Self-pay | Admitting: Physician Assistant

## 2015-04-26 DIAGNOSIS — R928 Other abnormal and inconclusive findings on diagnostic imaging of breast: Secondary | ICD-10-CM

## 2015-05-07 ENCOUNTER — Ambulatory Visit
Admission: RE | Admit: 2015-05-07 | Discharge: 2015-05-07 | Disposition: A | Discharge status: Home / Self Care | Payer: 59 | Source: Ambulatory Visit | Attending: Physician Assistant | Admitting: Physician Assistant

## 2015-05-07 DIAGNOSIS — R928 Other abnormal and inconclusive findings on diagnostic imaging of breast: Secondary | ICD-10-CM

## 2015-07-24 ENCOUNTER — Other Ambulatory Visit: Payer: Self-pay | Admitting: Physical Medicine and Rehabilitation

## 2015-07-24 DIAGNOSIS — M79604 Pain in right leg: Secondary | ICD-10-CM

## 2015-07-24 DIAGNOSIS — M79605 Pain in left leg: Principal | ICD-10-CM

## 2015-07-24 DIAGNOSIS — M545 Low back pain: Secondary | ICD-10-CM

## 2015-08-01 ENCOUNTER — Other Ambulatory Visit: Payer: Self-pay

## 2015-08-05 ENCOUNTER — Encounter (HOSPITAL_COMMUNITY): Payer: Self-pay | Admitting: Emergency Medicine

## 2015-08-05 ENCOUNTER — Emergency Department (HOSPITAL_COMMUNITY)
Admission: EM | Admit: 2015-08-05 | Discharge: 2015-08-05 | Disposition: A | Discharge status: Home / Self Care | Payer: BLUE CROSS/BLUE SHIELD | Attending: Emergency Medicine | Admitting: Emergency Medicine

## 2015-08-05 ENCOUNTER — Emergency Department (HOSPITAL_COMMUNITY): Payer: BLUE CROSS/BLUE SHIELD

## 2015-08-05 DIAGNOSIS — R0602 Shortness of breath: Secondary | ICD-10-CM | POA: Insufficient documentation

## 2015-08-05 DIAGNOSIS — F329 Major depressive disorder, single episode, unspecified: Secondary | ICD-10-CM | POA: Insufficient documentation

## 2015-08-05 DIAGNOSIS — Z79899 Other long term (current) drug therapy: Secondary | ICD-10-CM | POA: Diagnosis not present

## 2015-08-05 DIAGNOSIS — Y998 Other external cause status: Secondary | ICD-10-CM | POA: Insufficient documentation

## 2015-08-05 DIAGNOSIS — W08XXXA Fall from other furniture, initial encounter: Secondary | ICD-10-CM | POA: Insufficient documentation

## 2015-08-05 DIAGNOSIS — E669 Obesity, unspecified: Secondary | ICD-10-CM | POA: Insufficient documentation

## 2015-08-05 DIAGNOSIS — F419 Anxiety disorder, unspecified: Secondary | ICD-10-CM | POA: Diagnosis not present

## 2015-08-05 DIAGNOSIS — I10 Essential (primary) hypertension: Secondary | ICD-10-CM | POA: Diagnosis not present

## 2015-08-05 DIAGNOSIS — M1990 Unspecified osteoarthritis, unspecified site: Secondary | ICD-10-CM | POA: Insufficient documentation

## 2015-08-05 DIAGNOSIS — S29001A Unspecified injury of muscle and tendon of front wall of thorax, initial encounter: Secondary | ICD-10-CM | POA: Diagnosis present

## 2015-08-05 DIAGNOSIS — Y9289 Other specified places as the place of occurrence of the external cause: Secondary | ICD-10-CM | POA: Insufficient documentation

## 2015-08-05 DIAGNOSIS — S29002A Unspecified injury of muscle and tendon of back wall of thorax, initial encounter: Secondary | ICD-10-CM | POA: Insufficient documentation

## 2015-08-05 DIAGNOSIS — S301XXA Contusion of abdominal wall, initial encounter: Secondary | ICD-10-CM | POA: Diagnosis not present

## 2015-08-05 DIAGNOSIS — Y9389 Activity, other specified: Secondary | ICD-10-CM | POA: Insufficient documentation

## 2015-08-05 DIAGNOSIS — E785 Hyperlipidemia, unspecified: Secondary | ICD-10-CM | POA: Diagnosis not present

## 2015-08-05 MED ORDER — ONDANSETRON HCL 4 MG PO TABS
4.0000 mg | ORAL_TABLET | Freq: Once | ORAL | Status: AC
  Administered 2015-08-05: 4 mg via ORAL
  Filled 2015-08-05: qty 1

## 2015-08-05 MED ORDER — IBUPROFEN 800 MG PO TABS
800.0000 mg | ORAL_TABLET | Freq: Once | ORAL | Status: AC
  Administered 2015-08-05: 800 mg via ORAL
  Filled 2015-08-05: qty 1

## 2015-08-05 MED ORDER — HYDROCODONE-ACETAMINOPHEN 5-325 MG PO TABS: 1.0000 | ORAL_TABLET | ORAL | Status: DC | PRN

## 2015-08-05 MED ORDER — HYDROCODONE-ACETAMINOPHEN 5-325 MG PO TABS
2.0000 | ORAL_TABLET | Freq: Once | ORAL | Status: AC
  Administered 2015-08-05: 2 via ORAL
  Filled 2015-08-05: qty 2

## 2015-08-05 MED ORDER — METHOCARBAMOL 500 MG PO TABS: 500.0000 mg | ORAL_TABLET | Freq: Three times a day (TID) | ORAL | Status: DC

## 2015-08-05 MED ORDER — DIAZEPAM 5 MG PO TABS
5.0000 mg | ORAL_TABLET | Freq: Once | ORAL | Status: AC
  Administered 2015-08-05: 5 mg via ORAL
  Filled 2015-08-05: qty 1

## 2015-08-05 NOTE — Discharge Instructions (Signed)
Your vital signs are nonacute. The x-ray of your chest shows no significant injury to the lungs. The x-ray of the ribs is negative for fracture or dislocation at this time. Please practice cough and deep breathing several times per hour. Please use Tylenol or ibuprofen for mild soreness. Use Robaxin 3 times daily for muscle spasm in between the bruised ribs. May use Norco for more severe pain. Robaxin and Norco may cause drowsiness, please use these medications with caution. Please see Dr. Dennard Schaumann this week for recheck, and for any additional management needs.

## 2015-08-05 NOTE — ED Notes (Signed)
Pt states that she was checking the thermostat this morning around 1am and tripped, caught self on the recliner and it swiveled and she fell on her left ribs.

## 2015-08-05 NOTE — ED Provider Notes (Signed)
CSN: [id number]     Arrival date & time 08/05/15  0821 History   First MD Initiated Contact with Patient 08/05/15 [phone]     Chief Complaint  Patient presents with  . Rib Injury     (Consider location/radiation/quality/duration/timing/severity/associated sxs/prior Treatment) Patient is a 55 y.o. female presenting with chest pain. The history is provided by the patient.  Chest Pain Pain location:  L lateral chest Pain quality: sharp and shooting   Pain radiates to:  Does not radiate Pain severity:  Moderate Onset quality:  Sudden Duration:  8 hours Timing:  Intermittent Progression:  Worsening Chronicity:  New Context: breathing, movement and raising an arm   Relieved by:  Nothing Ineffective treatments:  None tried Associated symptoms: back pain and shortness of breath   Associated symptoms: no syncope and not vomiting   Risk factors: hypertension and obesity   Risk factors comment:  Fall with injury to left ribs.   Past Medical History  Diagnosis Date  . Hypertension   . Anxiety   . Depression   . Arthritis   . Right sided sciatica   . Obesity   . Migraines   . Hyperlipidemia    Past Surgical History  Procedure Laterality Date  . Cesarean section    . Nasal septoplasty w/ turbinoplasty    . Breast surgery Left 04/2014    biopsy    Family History  Problem Relation Age of Onset  . Lupus Son    Social History  Substance Use Topics  . Smoking status: Never Smoker   . Smokeless tobacco: Never Used  . Alcohol Use: No   OB History    No data available     Review of Systems  Respiratory: Positive for shortness of breath.   Cardiovascular: Positive for chest pain. Negative for syncope.  Gastrointestinal: Negative for vomiting.  Musculoskeletal: Positive for back pain.  Psychiatric/Behavioral: The patient is nervous/anxious.   All other systems reviewed and are negative.     Allergies  Review of patient's allergies indicates no known allergies.  Home  Medications   Prior to Admission medications   Medication Sig Start Date End Date Taking? Authorizing Provider  ALPRAZolam Duanne Moron) 0.5 MG tablet TAKE ONE TABLET BY MOUTH EVERY 8 HOURS AS NEEDED 06/17/13   Susy Frizzle, MD  aspirin-acetaminophen-caffeine (EXCEDRIN MIGRAINE) [phone] MG per tablet Take 1 tablet by mouth every 6 (six) hours as needed. For pain    Historical Provider, MD  atorvastatin (LIPITOR) 20 MG tablet Take 20 mg by mouth daily.    Historical Provider, MD  DULoxetine (CYMBALTA) 60 MG capsule Take 60 mg by mouth daily.    Historical Provider, MD  losartan-hydrochlorothiazide (HYZAAR) 100-12.5 MG per tablet Take 1 tablet by mouth daily. 06/21/13   Susy Frizzle, MD  omeprazole (PRILOSEC) 40 MG capsule Take 40 mg by mouth daily.    Historical Provider, MD   BP 145/92 mmHg  Pulse 108  Resp 18  Ht 5\' 5"  (1.651 m)  Wt 141.976 kg  BMI 52.09 kg/m2  SpO2 97%  LMP 10/07/2012 Physical Exam  Constitutional: She is oriented to person, place, and time. She appears well-developed and well-nourished.  Non-toxic appearance.  HENT:  Head: Normocephalic.  Right Ear: Tympanic membrane and external ear normal.  Left Ear: Tympanic membrane and external ear normal.  Eyes: EOM and lids are normal. Pupils are equal, round, and reactive to light.  Neck: Normal range of motion. Neck supple. Carotid bruit is not present.  Cardiovascular: Normal rate, regular rhythm, normal heart sounds, intact distal pulses and normal pulses.   Pulmonary/Chest: Breath sounds normal. No respiratory distress.   She exhibits tenderness.  Pt speaks in complete sentences. Symmetrical rise and fall of the chest.  Abdominal: Soft. Bowel sounds are normal. There is no tenderness. There is no guarding.    Musculoskeletal: Normal range of motion.  Lymphadenopathy:       Head (right side): No submandibular adenopathy present.       Head (left side): No submandibular adenopathy present.    She has no cervical  adenopathy.  Neurological: She is alert and oriented to person, place, and time. She has normal strength. No cranial nerve deficit or sensory deficit.  Skin: Skin is warm and dry.  Psychiatric: She has a normal mood and affect. Her speech is normal.  Nursing note and vitals reviewed.   ED Course  Procedures (including critical care time) Labs Review Labs Reviewed - No data to display  Imaging Review No results found. I have personally reviewed and evaluated these images and lab results as part of my medical decision-making.   EKG Interpretation None      MDM  Vital signs reviewed. Pulse oximetry is 97% on room air. Within normal limits by my interpretation. X-ray of the chest and the left ribs shows no evidence of pneumothorax or other lung related injury. There is no fracture or bone lesion noted within the ribs on the left.  I discussed the findings with the patient in terms which he understands. I discussed with her the importance of cough and deep breathing several times during the hour. A prescription for Robaxin and Norco given to the patient. The patient is to follow-up with the primary care physician if not improving for recheck. Patient will return to the emergency department if any emergent changes, problems, or concerns.    Final diagnoses:  Contusion, flank, initial encounter    *I have reviewed nursing notes, vital signs, and all appropriate lab and imaging results for this patient.[address] PA-C 08/05/15 1031  Nat Christen, MD 08/05/15 [phone]

## 2015-08-07 ENCOUNTER — Ambulatory Visit
Admission: RE | Admit: 2015-08-07 | Discharge: 2015-08-07 | Disposition: A | Discharge status: Home / Self Care | Payer: BLUE CROSS/BLUE SHIELD | Source: Ambulatory Visit | Attending: Physical Medicine and Rehabilitation | Admitting: Physical Medicine and Rehabilitation

## 2015-08-07 DIAGNOSIS — M545 Low back pain: Secondary | ICD-10-CM

## 2015-08-07 DIAGNOSIS — M79605 Pain in left leg: Principal | ICD-10-CM

## 2015-08-07 DIAGNOSIS — M79604 Pain in right leg: Secondary | ICD-10-CM

## 2015-09-13 ENCOUNTER — Encounter: Payer: BLUE CROSS/BLUE SHIELD | Attending: Physician Assistant

## 2015-09-13 VITALS — Ht 65.0 in | Wt 326.2 lb

## 2015-09-13 DIAGNOSIS — E119 Type 2 diabetes mellitus without complications: Secondary | ICD-10-CM | POA: Insufficient documentation

## 2015-09-13 NOTE — Progress Notes (Signed)
Patient was seen on 09/13/2015 for the first of a series of three diabetes self-management courses at the [facility].  Patient Education Plan per assessed needs and concerns is to attend four course education program for Diabetes Self Management Education.  The following learning objectives were met by the patient during this class:  Describe diabetes  State some common risk factors for diabetes  Defines the role of glucose and insulin  Identifies type of diabetes and pathophysiology  Describe the relationship between diabetes and cardiovascular risk  State the members of the Healthcare Team  States the rationale for glucose monitoring  State when to test glucose  State their individual Target Range  State the importance of logging glucose readings  Describe how to interpret glucose readings  Identifies A1C target  Explain the correlation between A1c and eAG values  State symptoms and treatment of high blood glucose  State symptoms and treatment of low blood glucose  Explain proper technique for glucose testing  Identifies proper sharps disposal  Handouts given during class include:  Living Well with Diabetes book  Carb Counting and Meal Planning book  Meal Plan Card  Carbohydrate guide  Meal planning worksheet  Low Sodium Flavoring Tips  The diabetes portion plate  M2T to eAG Conversion Chart  Diabetes Medications  Diabetes Recommended Care Schedule  Support Group  Diabetes Success Plan  Core Class Satisfaction Survey  Follow-Up Plan:  Attend core 2

## 2015-09-20 DIAGNOSIS — E119 Type 2 diabetes mellitus without complications: Secondary | ICD-10-CM

## 2015-09-20 NOTE — Progress Notes (Signed)

## 2015-09-27 ENCOUNTER — Ambulatory Visit: Payer: Self-pay

## 2015-09-28 ENCOUNTER — Other Ambulatory Visit: Payer: Self-pay

## 2015-09-28 DIAGNOSIS — M79622 Pain in left upper arm: Secondary | ICD-10-CM

## 2015-10-01 ENCOUNTER — Other Ambulatory Visit: Payer: Self-pay | Admitting: Physician Assistant

## 2015-10-01 DIAGNOSIS — N644 Mastodynia: Secondary | ICD-10-CM

## 2015-10-04 ENCOUNTER — Other Ambulatory Visit: Payer: Self-pay

## 2015-10-04 ENCOUNTER — Ambulatory Visit
Admission: RE | Admit: 2015-10-04 | Discharge: 2015-10-04 | Disposition: A | Discharge status: Home / Self Care | Payer: BLUE CROSS/BLUE SHIELD | Source: Ambulatory Visit | Attending: Physician Assistant | Admitting: Physician Assistant

## 2015-10-04 DIAGNOSIS — N644 Mastodynia: Secondary | ICD-10-CM

## 2015-10-09 ENCOUNTER — Ambulatory Visit: Payer: Self-pay

## 2016-04-04 ENCOUNTER — Other Ambulatory Visit: Payer: Self-pay | Admitting: Physician Assistant

## 2016-04-04 DIAGNOSIS — Z1231 Encounter for screening mammogram for malignant neoplasm of breast: Secondary | ICD-10-CM

## 2016-04-08 DIAGNOSIS — I10 Essential (primary) hypertension: Secondary | ICD-10-CM | POA: Diagnosis not present

## 2016-04-08 DIAGNOSIS — F329 Major depressive disorder, single episode, unspecified: Secondary | ICD-10-CM | POA: Diagnosis not present

## 2016-04-08 DIAGNOSIS — E7800 Pure hypercholesterolemia, unspecified: Secondary | ICD-10-CM | POA: Diagnosis not present

## 2016-04-08 DIAGNOSIS — E119 Type 2 diabetes mellitus without complications: Secondary | ICD-10-CM | POA: Diagnosis not present

## 2016-04-08 DIAGNOSIS — K219 Gastro-esophageal reflux disease without esophagitis: Secondary | ICD-10-CM | POA: Diagnosis not present

## 2016-04-29 ENCOUNTER — Ambulatory Visit
Admission: RE | Admit: 2016-04-29 | Discharge: 2016-04-29 | Disposition: A | Discharge status: Home / Self Care | Payer: Commercial Managed Care - HMO | Source: Ambulatory Visit | Attending: Physician Assistant | Admitting: Physician Assistant

## 2016-04-29 DIAGNOSIS — Z1231 Encounter for screening mammogram for malignant neoplasm of breast: Secondary | ICD-10-CM | POA: Diagnosis not present

## 2016-05-13 DIAGNOSIS — H547 Unspecified visual loss: Secondary | ICD-10-CM | POA: Diagnosis not present

## 2016-05-13 DIAGNOSIS — R109 Unspecified abdominal pain: Secondary | ICD-10-CM | POA: Diagnosis not present

## 2016-05-13 DIAGNOSIS — R350 Frequency of micturition: Secondary | ICD-10-CM | POA: Diagnosis not present

## 2016-10-07 DIAGNOSIS — K219 Gastro-esophageal reflux disease without esophagitis: Secondary | ICD-10-CM | POA: Diagnosis not present

## 2016-10-07 DIAGNOSIS — E119 Type 2 diabetes mellitus without complications: Secondary | ICD-10-CM | POA: Diagnosis not present

## 2016-10-07 DIAGNOSIS — I10 Essential (primary) hypertension: Secondary | ICD-10-CM | POA: Diagnosis not present

## 2016-10-07 DIAGNOSIS — Z0000 Encounter for general adult medical examination without abnormal findings: Secondary | ICD-10-CM | POA: Diagnosis not present

## 2016-10-07 DIAGNOSIS — E7800 Pure hypercholesterolemia, unspecified: Secondary | ICD-10-CM | POA: Diagnosis not present

## 2016-10-07 DIAGNOSIS — F329 Major depressive disorder, single episode, unspecified: Secondary | ICD-10-CM | POA: Diagnosis not present

## 2016-10-07 DIAGNOSIS — M109 Gout, unspecified: Secondary | ICD-10-CM | POA: Diagnosis not present

## 2017-01-23 DIAGNOSIS — E1165 Type 2 diabetes mellitus with hyperglycemia: Secondary | ICD-10-CM | POA: Diagnosis not present

## 2017-01-23 DIAGNOSIS — Z7984 Long term (current) use of oral hypoglycemic drugs: Secondary | ICD-10-CM | POA: Diagnosis not present

## 2017-02-27 DIAGNOSIS — J329 Chronic sinusitis, unspecified: Secondary | ICD-10-CM | POA: Diagnosis not present

## 2017-03-20 ENCOUNTER — Other Ambulatory Visit: Payer: Self-pay | Admitting: Physician Assistant

## 2017-03-20 DIAGNOSIS — Z1231 Encounter for screening mammogram for malignant neoplasm of breast: Secondary | ICD-10-CM

## 2017-04-02 DIAGNOSIS — M25552 Pain in left hip: Secondary | ICD-10-CM | POA: Diagnosis not present

## 2017-04-02 DIAGNOSIS — Z23 Encounter for immunization: Secondary | ICD-10-CM | POA: Diagnosis not present

## 2017-04-21 DIAGNOSIS — M109 Gout, unspecified: Secondary | ICD-10-CM | POA: Diagnosis not present

## 2017-04-21 DIAGNOSIS — Z794 Long term (current) use of insulin: Secondary | ICD-10-CM | POA: Diagnosis not present

## 2017-04-21 DIAGNOSIS — F329 Major depressive disorder, single episode, unspecified: Secondary | ICD-10-CM | POA: Diagnosis not present

## 2017-04-21 DIAGNOSIS — E7800 Pure hypercholesterolemia, unspecified: Secondary | ICD-10-CM | POA: Diagnosis not present

## 2017-04-21 DIAGNOSIS — K219 Gastro-esophageal reflux disease without esophagitis: Secondary | ICD-10-CM | POA: Diagnosis not present

## 2017-04-21 DIAGNOSIS — I10 Essential (primary) hypertension: Secondary | ICD-10-CM | POA: Diagnosis not present

## 2017-04-21 DIAGNOSIS — E1165 Type 2 diabetes mellitus with hyperglycemia: Secondary | ICD-10-CM | POA: Diagnosis not present

## 2017-05-04 ENCOUNTER — Ambulatory Visit
Admission: RE | Admit: 2017-05-04 | Discharge: 2017-05-04 | Disposition: A | Discharge status: Home / Self Care | Payer: Medicare HMO | Source: Ambulatory Visit | Attending: Physician Assistant | Admitting: Physician Assistant

## 2017-05-04 DIAGNOSIS — Z1231 Encounter for screening mammogram for malignant neoplasm of breast: Secondary | ICD-10-CM | POA: Diagnosis not present

## 2017-05-15 DIAGNOSIS — H5213 Myopia, bilateral: Secondary | ICD-10-CM | POA: Diagnosis not present

## 2017-08-20 DIAGNOSIS — E7800 Pure hypercholesterolemia, unspecified: Secondary | ICD-10-CM | POA: Diagnosis not present

## 2017-09-14 ENCOUNTER — Other Ambulatory Visit: Payer: 59 | Admitting: Obstetrics and Gynecology

## 2017-10-08 DIAGNOSIS — G629 Polyneuropathy, unspecified: Secondary | ICD-10-CM | POA: Diagnosis not present

## 2017-10-08 DIAGNOSIS — F329 Major depressive disorder, single episode, unspecified: Secondary | ICD-10-CM | POA: Diagnosis not present

## 2017-10-08 DIAGNOSIS — R609 Edema, unspecified: Secondary | ICD-10-CM | POA: Diagnosis not present

## 2017-10-08 DIAGNOSIS — K219 Gastro-esophageal reflux disease without esophagitis: Secondary | ICD-10-CM | POA: Diagnosis not present

## 2017-10-08 DIAGNOSIS — I10 Essential (primary) hypertension: Secondary | ICD-10-CM | POA: Diagnosis not present

## 2017-10-08 DIAGNOSIS — E1169 Type 2 diabetes mellitus with other specified complication: Secondary | ICD-10-CM | POA: Diagnosis not present

## 2017-10-08 DIAGNOSIS — E7800 Pure hypercholesterolemia, unspecified: Secondary | ICD-10-CM | POA: Diagnosis not present

## 2017-10-08 DIAGNOSIS — M109 Gout, unspecified: Secondary | ICD-10-CM | POA: Diagnosis not present

## 2017-11-01 ENCOUNTER — Emergency Department (HOSPITAL_COMMUNITY)
Admission: EM | Admit: 2017-11-01 | Discharge: 2017-11-01 | Disposition: A | Discharge status: Home / Self Care | Payer: Medicare HMO | Attending: Emergency Medicine | Admitting: Emergency Medicine

## 2017-11-01 ENCOUNTER — Encounter (HOSPITAL_COMMUNITY): Payer: Self-pay | Admitting: Emergency Medicine

## 2017-11-01 ENCOUNTER — Other Ambulatory Visit: Payer: Self-pay

## 2017-11-01 DIAGNOSIS — I10 Essential (primary) hypertension: Secondary | ICD-10-CM | POA: Insufficient documentation

## 2017-11-01 DIAGNOSIS — Z79899 Other long term (current) drug therapy: Secondary | ICD-10-CM | POA: Diagnosis not present

## 2017-11-01 DIAGNOSIS — K047 Periapical abscess without sinus: Secondary | ICD-10-CM | POA: Diagnosis not present

## 2017-11-01 DIAGNOSIS — K0889 Other specified disorders of teeth and supporting structures: Secondary | ICD-10-CM | POA: Diagnosis not present

## 2017-11-01 DIAGNOSIS — K029 Dental caries, unspecified: Secondary | ICD-10-CM | POA: Diagnosis not present

## 2017-11-01 DIAGNOSIS — E119 Type 2 diabetes mellitus without complications: Secondary | ICD-10-CM | POA: Diagnosis not present

## 2017-11-01 MED ORDER — HYDROCODONE-ACETAMINOPHEN 5-325 MG PO TABS: 1.0000 | ORAL_TABLET | ORAL | 0 refills | Status: DC | PRN

## 2017-11-01 MED ORDER — AMOXICILLIN 250 MG PO CAPS
500.0000 mg | ORAL_CAPSULE | Freq: Once | ORAL | Status: AC
  Administered 2017-11-01: 500 mg via ORAL
  Filled 2017-11-01: qty 2

## 2017-11-01 MED ORDER — AMOXICILLIN 500 MG PO CAPS: 500.0000 mg | ORAL_CAPSULE | Freq: Three times a day (TID) | ORAL | 0 refills | Status: AC

## 2017-11-01 MED ORDER — HYDROCODONE-ACETAMINOPHEN 5-325 MG PO TABS
1.0000 | ORAL_TABLET | Freq: Once | ORAL | Status: AC
  Administered 2017-11-01: 1 via ORAL
  Filled 2017-11-01: qty 1

## 2017-11-01 NOTE — ED Triage Notes (Signed)
Patient c/o right lower dental pain since Friday. Unsure of any fevers. Patient states pain radiates into jaw, neck, and right ear. Per patient took ibuprofen this morning with no relief-unsure of time she took medication.

## 2017-11-01 NOTE — Discharge Instructions (Addendum)
Complete your entire course of antibiotics as prescribed.  You  may use the hydrocodone for pain relief but do not drive within 4 hours of taking as this will make you drowsy.  Avoid applying heat or ice to this abscess area which can worsen your symptoms.  You may use warm salt water swish and spit treatment or half peroxide and water swish and spit after meals to keep this area clean.   As discussed, your blood pressure is elevated today, possibly from your pain, but you need to have this rechecked this week.  Make sure you are taking your blood pressure medications.

## 2017-11-01 NOTE — ED Notes (Signed)
Discharge held due to BP

## 2017-11-01 NOTE — ED Notes (Addendum)
Pt had 7 teeth pulled in March  Continued pain to Bottom molar along jaw  Called dentist who told pt to come here per pt

## 2017-11-02 NOTE — ED Provider Notes (Signed)
[facility] EMERGENCY DEPARTMENT Provider Note   CSN: [id number] Arrival date & time: 11/01/17  1219     History   Chief Complaint Chief Complaint  Patient presents with  . Dental Pain    HPI Nancy Oliver is a 57 y.o. female  Presenting with a 2 day history of dental pain and gingival swelling.   The patient has a history of decay in the tooth involved which has recently started to cause increased  pain. She reports another piece of tooth fell off the tooth while eating.  There has been no fevers, chills, nausea or vomiting, also no complaint of difficulty swallowing, although chewing makes pain worse.  The patient has tried ibuprofen without relief of symptoms.    .  The history is provided by the patient.    Past Medical History:  Diagnosis Date  . Anxiety   . Arthritis   . Depression   . Diabetes mellitus without complication ([place])   . Hyperlipidemia   . Hypertension   . Migraines   . Obesity   . Right sided sciatica     Patient Active Problem List   Diagnosis Date Noted  . Right sided sciatica     Past Surgical History:  Procedure Laterality Date  . BREAST SURGERY Left 04/2014   biopsy   . CESAREAN SECTION    . NASAL SEPTOPLASTY W/ TURBINOPLASTY       OB History   None      Home Medications    Prior to Admission medications   Medication Sig Start Date End Date Taking? Authorizing Provider  ALPRAZolam Duanne Moron) 0.5 MG tablet TAKE ONE TABLET BY MOUTH EVERY 8 HOURS AS NEEDED 06/17/13   Susy Frizzle, MD  amoxicillin (AMOXIL) 500 MG capsule Take 1 capsule (500 mg total) by mouth 3 (three) times daily for 10 days. 11/01/17 11/11/17  Evalee Jefferson, PA-C  aspirin-acetaminophen-caffeine (EXCEDRIN MIGRAINE) [phone] MG per tablet Take 1 tablet by mouth every 6 (six) hours as needed. For pain    [provider]  atorvastatin (LIPITOR) 20 MG tablet Take 20 mg by mouth daily.    [provider]  DULoxetine (CYMBALTA) 60 MG capsule Take 60 mg  by mouth daily.    [provider]  HYDROcodone-acetaminophen (NORCO/VICODIN) 5-325 MG tablet Take 1 tablet by mouth every 4 (four) hours as needed. 11/01/17   Evalee Jefferson, PA-C  losartan-hydrochlorothiazide (HYZAAR) 100-12.5 MG per tablet Take 1 tablet by mouth daily. 06/21/13   Susy Frizzle, MD  methocarbamol (ROBAXIN) 500 MG tablet Take 1 tablet (500 mg total) by mouth 3 (three) times daily. 08/05/15   Lily Kocher, PA-C  omeprazole (PRILOSEC) 40 MG capsule Take 40 mg by mouth daily.    [provider]    Family History Family History  Problem Relation Age of Onset  . Lupus Son     Social History Social History   Tobacco Use  . Smoking status: Never Smoker  . Smokeless tobacco: Never Used  Substance Use Topics  . Alcohol use: No  . Drug use: No     Allergies   Patient has no known allergies.   Review of Systems Review of Systems  Constitutional: Negative for fever.  HENT: Positive for dental problem. Negative for facial swelling and sore throat.   Respiratory: Negative for shortness of breath.   Musculoskeletal: Negative for neck pain and neck stiffness.     Physical Exam Updated Vital Signs BP (!) 154/98 (BP Location: Right  Arm)   Pulse 82   Temp 98.1 F (36.7 C) (Oral)   Resp 16   Ht 5\' 6"  (1.676 m)   Wt 136.1 kg (300 lb)   LMP 10/07/2012   SpO2 98%   BMI 48.42 kg/m   Physical Exam  Constitutional: She is oriented to person, place, and time. She appears well-developed and well-nourished. No distress.  HENT:  Head: Normocephalic and atraumatic.  Right Ear: Tympanic membrane and external ear normal.  Left Ear: Tympanic membrane and external ear normal.  Mouth/Throat: Oropharynx is clear and moist and mucous membranes are normal. No oral lesions. No trismus in the jaw. Dental abscesses present.  Generalized poor dentition, multiple extractions. Fractured/decayed teeth along the right lower canine/premolar group, mild gingival edema  and erythema without fluctuant abscess. Cheek without erythema, edema or induration.  Eyes: Conjunctivae are normal.  Neck: Normal range of motion. Neck supple.  Cardiovascular: Normal rate and normal heart sounds.  Pulmonary/Chest: Effort normal.  Musculoskeletal: Normal range of motion.  Lymphadenopathy:    She has no cervical adenopathy.  Neurological: She is alert and oriented to person, place, and time.  Skin: Skin is warm and dry. No erythema.  Psychiatric: She has a normal mood and affect.     ED Treatments / Results  Labs (all labs ordered are listed, but only abnormal results are displayed) Labs Reviewed - No data to display  EKG None  Radiology No results found.  Procedures Procedures (including critical care time)  Medications Ordered in ED Medications  HYDROcodone-acetaminophen (NORCO/VICODIN) 5-325 MG per tablet 1 tablet (1 tablet Oral Given 11/01/17 1417)  amoxicillin (AMOXIL) capsule 500 mg (500 mg Oral Given 11/01/17 1417)     Initial Impression / Assessment and Plan / ED Course  I have reviewed the triage vital signs and the nursing notes.  Pertinent labs & imaging results that were available during my care of the patient were reviewed by me and considered in my medical decision making (see chart for details).     No trismus, no identifiable dental abscess. Dental decay with gingivitis. Amoxil, hydrocodone, pat plans to see dentistry next week (in [place]).  Final Clinical Impressions(s) / ED Diagnoses   Final diagnoses:  Infected dental caries    ED Discharge Orders        Ordered    amoxicillin (AMOXIL) 500 MG capsule  3 times daily     11/01/17 1407    HYDROcodone-acetaminophen (NORCO/VICODIN) 5-325 MG tablet  Every 4 hours PRN     11/01/17 1407       Evalee Jefferson, PA-C 11/02/17 2035    Noemi Chapel, MD 11/03/17 1152

## 2017-12-24 DIAGNOSIS — H1045 Other chronic allergic conjunctivitis: Secondary | ICD-10-CM | POA: Diagnosis not present

## 2017-12-24 DIAGNOSIS — H40013 Open angle with borderline findings, low risk, bilateral: Secondary | ICD-10-CM | POA: Diagnosis not present

## 2017-12-24 DIAGNOSIS — H16223 Keratoconjunctivitis sicca, not specified as Sjogren's, bilateral: Secondary | ICD-10-CM | POA: Diagnosis not present

## 2017-12-24 DIAGNOSIS — H04123 Dry eye syndrome of bilateral lacrimal glands: Secondary | ICD-10-CM | POA: Diagnosis not present

## 2017-12-24 DIAGNOSIS — H2513 Age-related nuclear cataract, bilateral: Secondary | ICD-10-CM | POA: Diagnosis not present

## 2017-12-24 DIAGNOSIS — H40053 Ocular hypertension, bilateral: Secondary | ICD-10-CM | POA: Diagnosis not present

## 2017-12-24 DIAGNOSIS — H02403 Unspecified ptosis of bilateral eyelids: Secondary | ICD-10-CM | POA: Diagnosis not present

## 2018-01-04 IMAGING — MR MR LUMBAR SPINE W/O CM
4 of 5 series · 19 of 48 positions shown · non-contrast
Comparison: None.

CLINICAL DATA: Low back pain radiating into both legs for 3 months

EXAM:
MRI LUMBAR SPINE WITHOUT CONTRAST
TECHNIQUE: Multiplanar, multisequence MR imaging of the lumbar spine was
performed. No intravenous contrast was administered.

[Series 6: T2 · sagittal · 4.0mm · 0.73mm/px · 7 of 15 slices shown (1 of 2)]
[im 1/15]
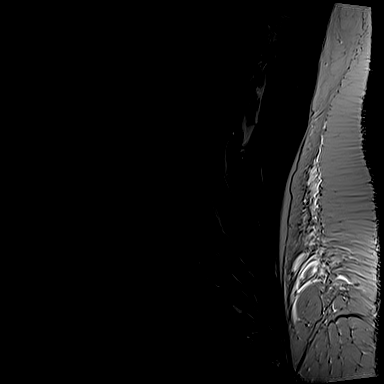
[im 3/15]
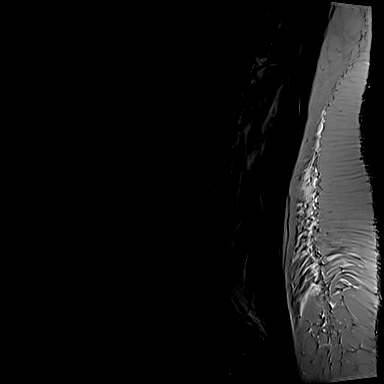
[im 5/15]
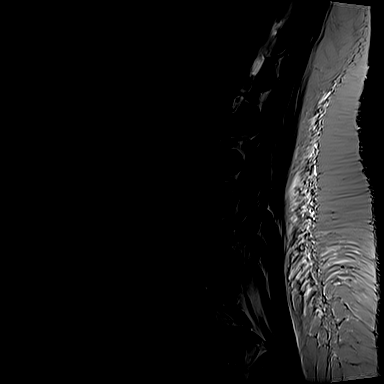
[im 8/15]
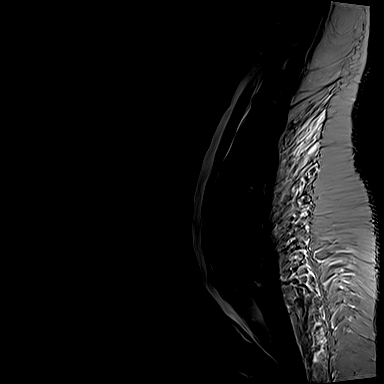
[im 10/15]
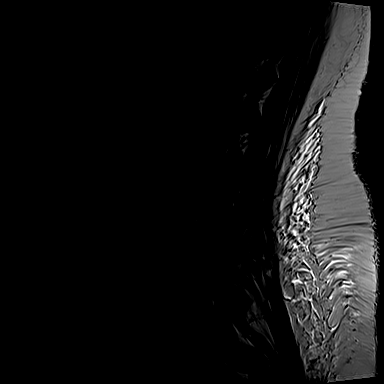
[im 12/15]
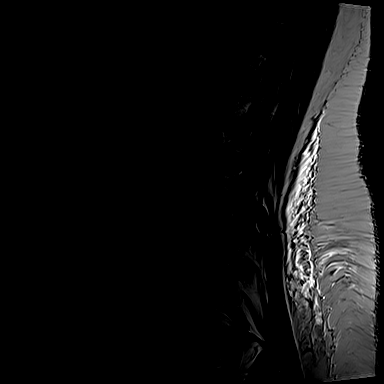
[im 15/15]
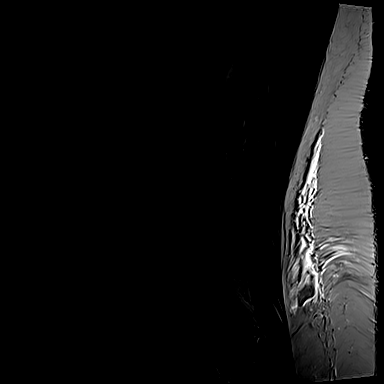

[Series 8: T1 · sagittal · 4.0mm · 0.73mm/px · 3 of 15 slices shown (1 of 2)]
[im 3/15]
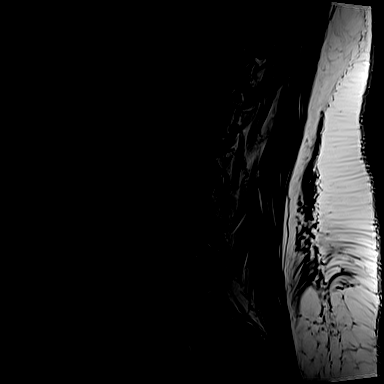
[im 9/15]
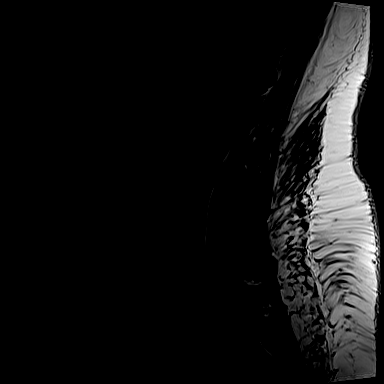
[im 15/15]
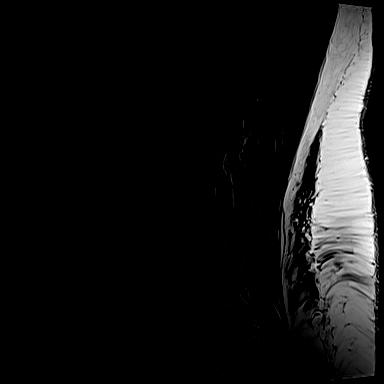

[Series 11: T1 · axial · 4.0mm · 0.28mm/px · z∈[+27,+162]mm · 3 of 34 slices shown (2 of 2)]
[im 6/34]
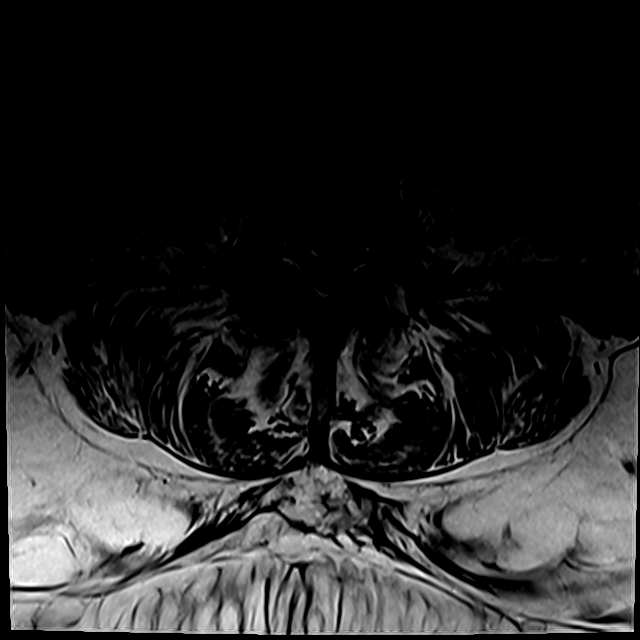
[im 18/34]
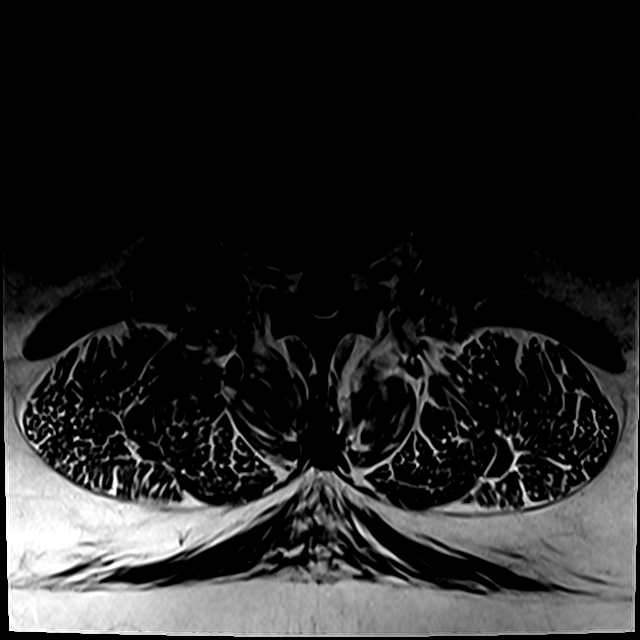
[im 28/34]
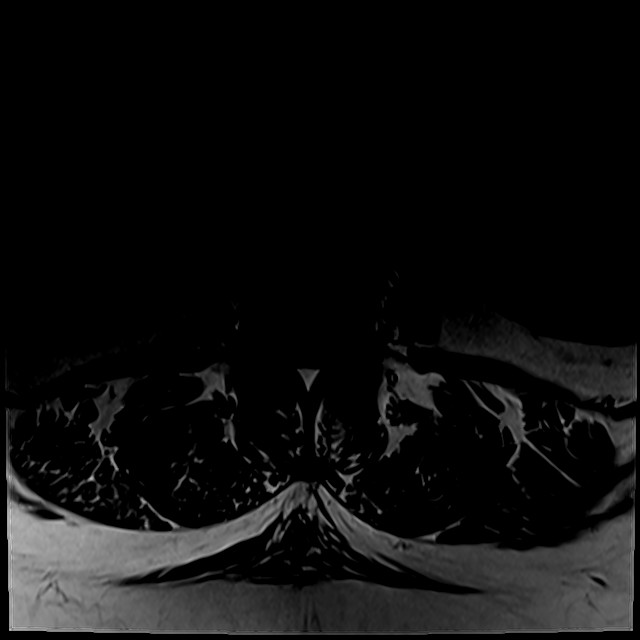

[Series 14: T2 · axial · 4.0mm · 0.28mm/px · z∈[+2,+162]mm · 6 of 34 slices shown (2 of 2)]
[im 1/34]
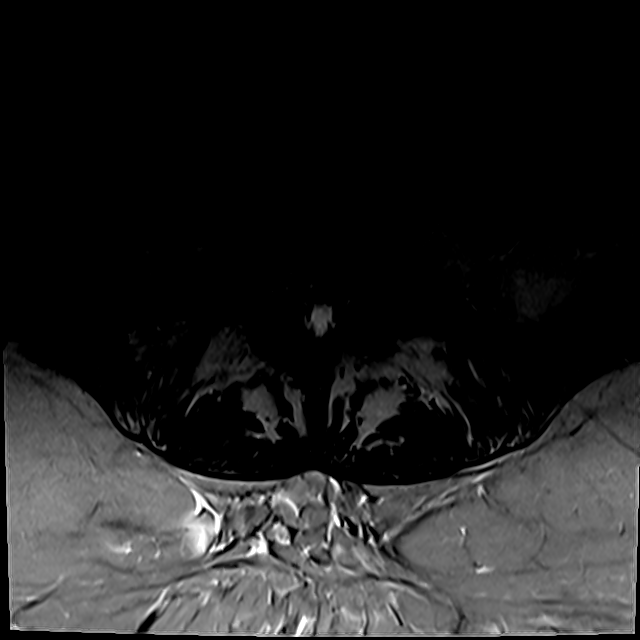
[im 6/34]
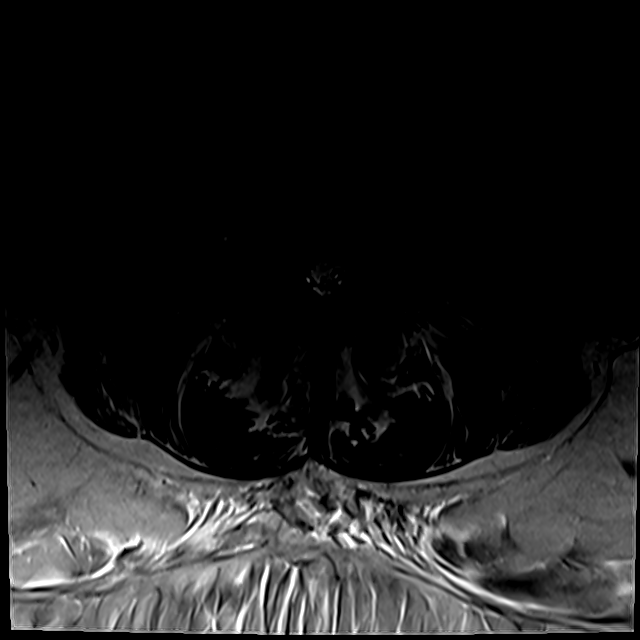
[im 11/34]
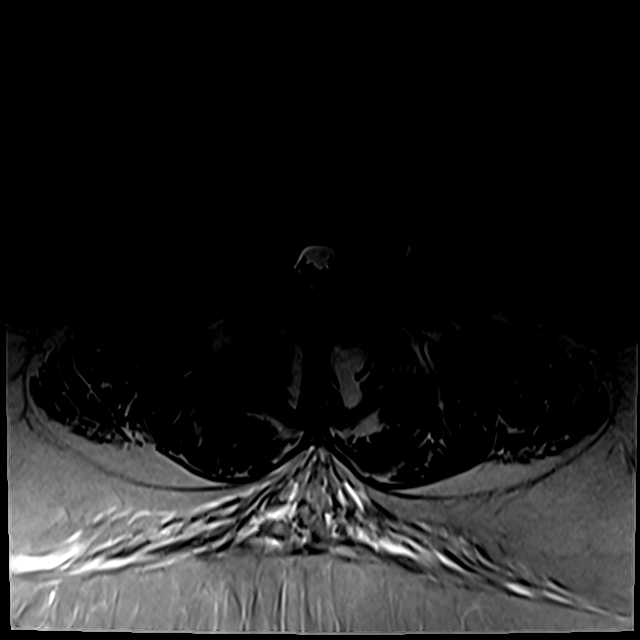
[im 16/34]
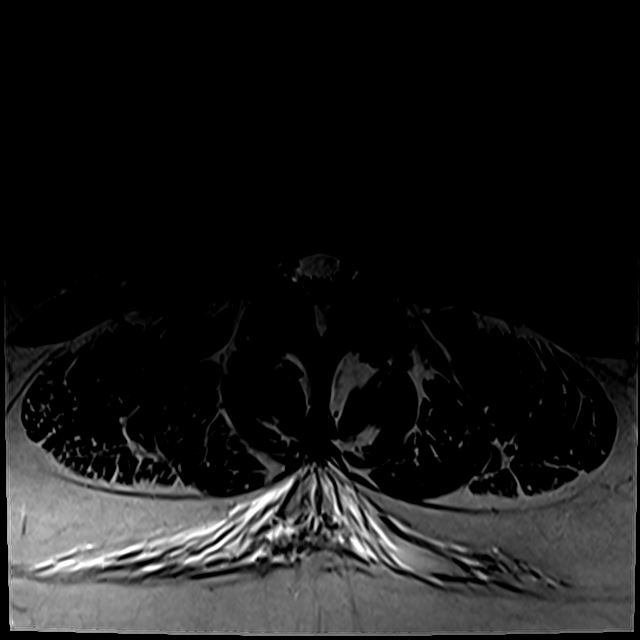
[im 18/34]
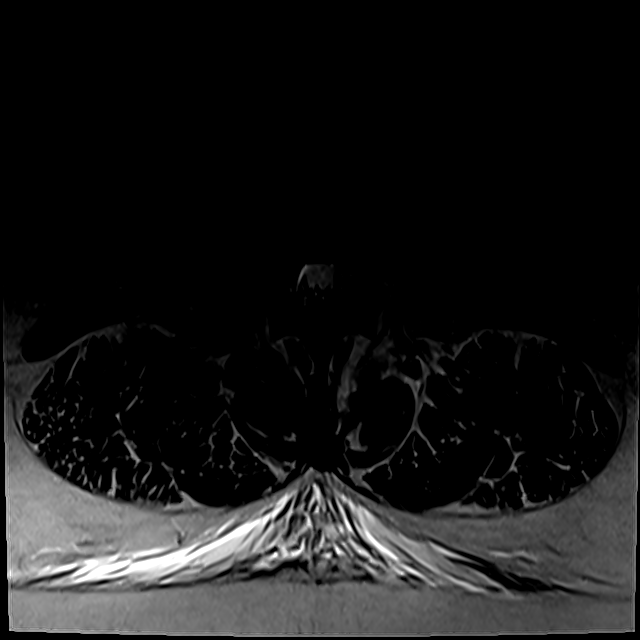
[im 28/34]
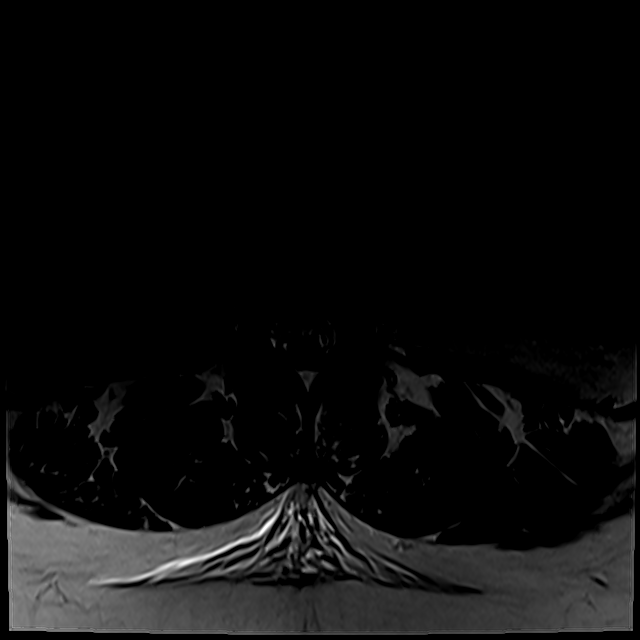

[19 of 48 positions shown; findings below may reference images not displayed]

FINDINGS: Segmentation: Transitional anatomy with sacralization of the L5
vertebral body.

Alignment: The vertebral bodies of the lumbar spine are normal in
alignment.

Bones: The vertebral bodies of the lumbar spine are normal in size.
There is normal bone marrow signal demonstrated throughout the
vertebra. The visualized portions of the SI joints are unremarkable.

Conus medullaris: Extends to the T12-L1 level and appears normal.
The nerve roots of the cauda equina and the filum terminale are
normal.

Paraspinal and other soft tissues: There is no focal abnormality.
The imaged intra-abdominal contents are unremarkable.

Disc levels:

Disc spaces: Degenerative disc disease with disc desiccation at
L4-5.

T12-L1: Shallow left paracentral disc protrusion. No evidence of
neural foraminal stenosis. No central canal stenosis.

L1-L2: No significant disc bulge. No evidence of neural foraminal
stenosis. No central canal stenosis. Mild bilateral facet
arthropathy.

L2-L3: No significant disc bulge. No evidence of neural foraminal
stenosis. No central canal stenosis. Mild bilateral facet
arthropathy.

L3-L4: Mild broad-based disc bulge. Mild bilateral facet
arthropathy. No evidence of neural foraminal stenosis. No central
canal stenosis.

L4-L5: Mild broad-based disc bulge with a tiny central disc
protrusion. Moderate bilateral facet arthropathy. No evidence of
neural foraminal stenosis. No central canal stenosis.

L5-S1: No significant disc bulge. No evidence of neural foraminal
stenosis. No central canal stenosis.
IMPRESSION: 1. At L4-5 there is a mild broad-based disc bulge with a tiny
central disc protrusion. Moderate bilateral facet arthropathy.

## 2018-01-20 DIAGNOSIS — G629 Polyneuropathy, unspecified: Secondary | ICD-10-CM | POA: Diagnosis not present

## 2018-01-20 DIAGNOSIS — Z23 Encounter for immunization: Secondary | ICD-10-CM | POA: Diagnosis not present

## 2018-01-20 DIAGNOSIS — E7800 Pure hypercholesterolemia, unspecified: Secondary | ICD-10-CM | POA: Diagnosis not present

## 2018-01-20 DIAGNOSIS — F322 Major depressive disorder, single episode, severe without psychotic features: Secondary | ICD-10-CM | POA: Diagnosis not present

## 2018-01-20 DIAGNOSIS — K219 Gastro-esophageal reflux disease without esophagitis: Secondary | ICD-10-CM | POA: Diagnosis not present

## 2018-01-20 DIAGNOSIS — I10 Essential (primary) hypertension: Secondary | ICD-10-CM | POA: Diagnosis not present

## 2018-01-20 DIAGNOSIS — E1169 Type 2 diabetes mellitus with other specified complication: Secondary | ICD-10-CM | POA: Diagnosis not present

## 2018-03-25 ENCOUNTER — Other Ambulatory Visit: Payer: Self-pay | Admitting: Physician Assistant

## 2018-03-25 DIAGNOSIS — Z1231 Encounter for screening mammogram for malignant neoplasm of breast: Secondary | ICD-10-CM

## 2018-04-23 DIAGNOSIS — M109 Gout, unspecified: Secondary | ICD-10-CM | POA: Diagnosis not present

## 2018-04-23 DIAGNOSIS — F322 Major depressive disorder, single episode, severe without psychotic features: Secondary | ICD-10-CM | POA: Diagnosis not present

## 2018-04-23 DIAGNOSIS — I10 Essential (primary) hypertension: Secondary | ICD-10-CM | POA: Diagnosis not present

## 2018-04-23 DIAGNOSIS — Z7984 Long term (current) use of oral hypoglycemic drugs: Secondary | ICD-10-CM | POA: Diagnosis not present

## 2018-04-23 DIAGNOSIS — K219 Gastro-esophageal reflux disease without esophagitis: Secondary | ICD-10-CM | POA: Diagnosis not present

## 2018-04-23 DIAGNOSIS — G629 Polyneuropathy, unspecified: Secondary | ICD-10-CM | POA: Diagnosis not present

## 2018-04-23 DIAGNOSIS — E1169 Type 2 diabetes mellitus with other specified complication: Secondary | ICD-10-CM | POA: Diagnosis not present

## 2018-04-23 DIAGNOSIS — E7800 Pure hypercholesterolemia, unspecified: Secondary | ICD-10-CM | POA: Diagnosis not present

## 2018-05-05 ENCOUNTER — Ambulatory Visit
Admission: RE | Admit: 2018-05-05 | Discharge: 2018-05-05 | Disposition: A | Discharge status: Home / Self Care | Payer: Medicare HMO | Source: Ambulatory Visit | Attending: Physician Assistant | Admitting: Physician Assistant

## 2018-05-05 DIAGNOSIS — Z1231 Encounter for screening mammogram for malignant neoplasm of breast: Secondary | ICD-10-CM

## 2018-07-30 ENCOUNTER — Other Ambulatory Visit: Payer: Self-pay | Admitting: Physician Assistant

## 2018-07-30 ENCOUNTER — Other Ambulatory Visit (HOSPITAL_COMMUNITY): Payer: Self-pay | Admitting: Physician Assistant

## 2018-07-30 DIAGNOSIS — G629 Polyneuropathy, unspecified: Secondary | ICD-10-CM | POA: Diagnosis not present

## 2018-07-30 DIAGNOSIS — E1169 Type 2 diabetes mellitus with other specified complication: Secondary | ICD-10-CM | POA: Diagnosis not present

## 2018-07-30 DIAGNOSIS — I10 Essential (primary) hypertension: Secondary | ICD-10-CM | POA: Diagnosis not present

## 2018-07-30 DIAGNOSIS — R011 Cardiac murmur, unspecified: Secondary | ICD-10-CM | POA: Diagnosis not present

## 2018-07-30 DIAGNOSIS — K219 Gastro-esophageal reflux disease without esophagitis: Secondary | ICD-10-CM | POA: Diagnosis not present

## 2018-07-30 DIAGNOSIS — F322 Major depressive disorder, single episode, severe without psychotic features: Secondary | ICD-10-CM | POA: Diagnosis not present

## 2018-07-30 DIAGNOSIS — E7800 Pure hypercholesterolemia, unspecified: Secondary | ICD-10-CM | POA: Diagnosis not present

## 2018-07-30 DIAGNOSIS — M109 Gout, unspecified: Secondary | ICD-10-CM | POA: Diagnosis not present

## 2018-08-17 ENCOUNTER — Ambulatory Visit (HOSPITAL_COMMUNITY): Payer: Medicare HMO | Attending: Cardiovascular Disease

## 2018-08-17 DIAGNOSIS — R011 Cardiac murmur, unspecified: Secondary | ICD-10-CM | POA: Diagnosis not present

## 2018-10-14 ENCOUNTER — Telehealth: Payer: Self-pay | Admitting: Cardiology

## 2018-10-14 NOTE — Telephone Encounter (Signed)
Virtual Visit Pre-Appointment Phone Call  "(Name), I am calling you today to discuss your upcoming appointment. We are currently trying to limit exposure to the virus that causes COVID-19 by seeing patients at home rather than in the office."  1. "What is the BEST phone number to call the day of the visit?" - include this in appointment notes  2. Do you have or have access to (through a family member/friend) a smartphone with video capability that we can use for your visit?" a. If yes - list this number in appt notes as cell (if different from BEST phone #) and list the appointment type as a VIDEO visit in appointment notes b. If no - list the appointment type as a PHONE visit in appointment notes  3. Confirm consent - "In the setting of the current Covid19 crisis, you are scheduled for a (phone or video) visit with your provider on (date) at (time).  Just as we do with many in-office visits, in order for you to participate in this visit, we must obtain consent.  If you'd like, I can send this to your mychart (if signed up) or email for you to review.  Otherwise, I can obtain your verbal consent now.  All virtual visits are billed to your insurance company just like a normal visit would be.  By agreeing to a virtual visit, we'd like you to understand that the technology does not allow for your provider to perform an examination, and thus may limit your provider's ability to fully assess your condition. If your provider identifies any concerns that need to be evaluated in person, we will make arrangements to do so.  Finally, though the technology is pretty good, we cannot assure that it will always work on either your or our end, and in the setting of a video visit, we may have to convert it to a phone-only visit.  In either situation, we cannot ensure that we have a secure connection.  Are you willing to proceed?" STAFF: Did the patient verbally acknowledge consent to telehealth visit? Document  YES/NO here: Yes  4. Advise patient to be prepared - "Two hours prior to your appointment, go ahead and check your blood pressure, pulse, oxygen saturation, and your weight (if you have the equipment to check those) and write them all down. When your visit starts, your provider will ask you for this information. If you have an Apple Watch or Kardia device, please plan to have heart rate information ready on the day of your appointment. Please have a pen and paper handy nearby the day of the visit as well."  5. Give patient instructions for MyChart download to smartphone OR [email or web address] as below if video visit (depending on what platform provider is using)  6. Inform patient they will receive a phone call 15 minutes prior to their appointment time (may be from unknown caller ID) so they should be prepared to answer    TELEPHONE CALL NOTE  Nancy Oliver has been deemed a candidate for a follow-up tele-health visit to limit community exposure during the Covid-19 pandemic. I spoke with the patient via phone to ensure availability of phone/video source, confirm preferred email & phone number, and discuss instructions and expectations.  I reminded Nancy Oliver to be prepared with any vital sign and/or heart rhythm information that could potentially be obtained via home monitoring, at the time of her visit. I reminded Nancy Oliver to expect a phone call prior to  her visit.  Terry L Goins 10/14/2018 3:00 PM

## 2018-10-18 NOTE — Progress Notes (Signed)
Virtual Visit via Video Note   This visit type was conducted due to national recommendations for restrictions regarding the COVID-19 Pandemic (e.g. social distancing) in an effort to limit this patient's exposure and mitigate transmission in our community.  Due to her co-morbid illnesses, this patient is at least at moderate risk for complications without adequate follow up.  This format is felt to be most appropriate for this patient at this time.  All issues noted in this document were discussed and addressed.  A limited physical exam was performed with this format.  Please refer to the patient's chart for her consent to telehealth for [facility].   Date:  10/19/2018   ID:  Nancy Oliver, DOB 18-Jan-1961, MRN [id number]  Patient Location: Home Provider Location: Office  PCP:  Lennie Odor, PA-C  Cardiologist:  Minus Breeding, MD  Electrophysiologist:  None   Evaluation Performed:  New Patient Evaluation  Chief Complaint:  Murmur  History of Present Illness:    Nancy Oliver is a 58 y.o. female with no prior cardiac history who is referred by Nancy Billet, PA .  She was noticed during recent office examination to have a murmur.  She also was found apparently to have some ectopy though I do not have an EKG.  She had an echo in March with an EF of 60 - 65% with some mild aortic stenosis with a tricuspid valve but with some calcification.  She apparently does well.  She does not have any past cardiac work-up.  She says she does not really feel any palpitations for the most part although when she thinks about it there might be an occasional skipped flutter but nothing sustained.  She has no presyncope or syncope.  She gets a little dizzy when she bends over and stands back up but she is not had any significant issues with this.  She gets chest discomfort occasionally that wakes her up in the night but this is not been severe.  She does not have chest discomfort with activities.  She is  able to walk around the park and try to do this and she might get some shortness of breath but no chest pressure, neck or arm discomfort.  She has no PND or orthopnea.   The patient does not have symptoms concerning for COVID-19 infection (fever, chills, cough, or new shortness of breath).    Past Medical History:  Diagnosis Date   Anxiety    Arthritis    Depression    Diabetes mellitus without complication ([place])    Hyperlipidemia    Hypertension    Migraines    Obesity    Right sided sciatica    Past Surgical History:  Procedure Laterality Date   BREAST SURGERY Left 04/2014   biopsy    CESAREAN SECTION     KNEE SURGERY     NASAL SEPTOPLASTY W/ TURBINOPLASTY       Current Meds  Medication Sig   allopurinol (ZYLOPRIM) 300 MG tablet Take 1 tablet by mouth daily.   ALPRAZolam (XANAX) 0.5 MG tablet TAKE ONE TABLET BY MOUTH EVERY 8 HOURS AS NEEDED   aspirin-acetaminophen-caffeine (EXCEDRIN MIGRAINE) 250-250-65 MG per tablet Take 1 tablet by mouth every 6 (six) hours as needed. For pain   atorvastatin (LIPITOR) 40 MG tablet Take 40 mg by mouth daily.   Dulaglutide (TRULICITY) 1.51 VO/1.6WV SOPN Inject 0.5 mLs into the skin once a week.   furosemide (LASIX) 20 MG tablet Take 1 tablet by  mouth daily.   gabapentin (NEURONTIN) 100 MG capsule Take 1 capsule by mouth at bedtime. Or up to twice daily as needed   losartan-hydrochlorothiazide (HYZAAR) 100-12.5 MG per tablet Take 1 tablet by mouth daily.   metFORMIN (GLUCOPHAGE) 1000 MG tablet Take 1,000 mg by mouth 2 (two) times daily with a meal.   omeprazole (PRILOSEC) 40 MG capsule Take 40 mg by mouth daily.   sertraline (ZOLOFT) 50 MG tablet Take 1 tablet by mouth daily.   SITagliptin Phosphate (JANUVIA PO) Take 1 tablet by mouth daily.   TRESIBA FLEXTOUCH 100 UNIT/ML SOPN FlexTouch Pen Inject 18 Units into the skin daily.     Allergies:   Patient has no known allergies.   Social History   Tobacco Use     Smoking status: Never Smoker   Smokeless tobacco: Never Used  Substance Use Topics   Alcohol use: No   Drug use: No     Family Hx: The patient's family history includes COPD in her father; Lupus in her son; Muscular dystrophy in her sister.  ROS:   Please see the history of present illness.    As stated in the HPI and negative for all other systems.   Prior CV studies:   The following studies were reviewed today:  Echo  Labs/Other Tests and Data Reviewed:    EKG:  No ECG reviewed.  Recent Labs: No results found for requested labs within last 8760 hours.   Recent Lipid Panel Lab Results  Component Value Date/Time   CHOL 189 06/17/2013 09:46 AM   TRIG 82 06/17/2013 09:46 AM   HDL 56 06/17/2013 09:46 AM   CHOLHDL 3.4 06/17/2013 09:46 AM   LDLCALC 117 (H) 06/17/2013 09:46 AM    Wt Readings from Last 3 Encounters:  10/19/18 280 lb (127 kg)  11/01/17 300 lb (136.1 kg)  09/13/15 (!) 326 lb 3.2 oz (148 kg)     Objective:    Vital Signs:  BP 115/73    Pulse 94    Ht 5\' 7"  (1.702 m)    Wt 280 lb (127 kg)    LMP 10/07/2012    BMI 43.85 kg/m    VITAL SIGNS:  reviewed GEN:  no acute distress RESPIRATORY:  normal respiratory effort, symmetric expansion NEURO:  alert and oriented x 3, no obvious focal deficit PSYCH:  normal affect  ASSESSMENT & PLAN:    AS: The patient has very mild aortic stenosis and no symptoms related to this.  At this point I do not think further cardiovascular testing is suggested but I will see her back in about 6 months to review this and listen to her heart murmur.  She can then be followed clinically with physical exams over the years.  We discussed the nature of this and the indication for follow-up as well as the symptoms that would develop should this become more severe.  CHEST PAIN: She has very atypical chest discomfort.  At this point no further cardiovascular testing is suggested for this.  The pretest probability that this is  related to obstructive coronary disease is very low.  HTN:   Blood pressure is controlled as above.  No change in therapy.  DM: A1c 6.4.  Per her primary provider no change in therapy.  DYSLIPIDEMIA: LDL is slightly elevated at 107.  HDL is 50.  She is on moderate dose statin and I might suggest a goal LDL of 70 given diabetes.  Again I will defer to her primary provider.  COVID-19 Education: The signs and symptoms of COVID-19 were discussed with the patient and how to seek care for testing (follow up with PCP or arrange E-visit).  The importance of social distancing was discussed today.  Time:   Today, I have spent  minutes with the patient with telehealth technology discussing the above problems.     Medication Adjustments/Labs and Tests Ordered: Current medicines are reviewed at length with the patient today.  Concerns regarding medicines are outlined above.   Tests Ordered: No orders of the defined types were placed in this encounter.   Medication Changes: No orders of the defined types were placed in this encounter.   Disposition:  Follow up with me in six months  Signed, Minus Breeding, MD  10/19/2018 12:11 PM    [place]

## 2018-10-19 ENCOUNTER — Encounter: Payer: Self-pay | Admitting: Cardiology

## 2018-10-19 ENCOUNTER — Telehealth (INDEPENDENT_AMBULATORY_CARE_PROVIDER_SITE_OTHER): Payer: Medicare HMO | Admitting: Cardiology

## 2018-10-19 VITALS — BP 115/73 | HR 94 | Ht 67.0 in | Wt 280.0 lb

## 2018-10-19 DIAGNOSIS — I350 Nonrheumatic aortic (valve) stenosis: Secondary | ICD-10-CM | POA: Diagnosis not present

## 2018-10-19 DIAGNOSIS — E785 Hyperlipidemia, unspecified: Secondary | ICD-10-CM

## 2018-10-19 DIAGNOSIS — R079 Chest pain, unspecified: Secondary | ICD-10-CM | POA: Insufficient documentation

## 2018-10-19 DIAGNOSIS — I10 Essential (primary) hypertension: Secondary | ICD-10-CM | POA: Insufficient documentation

## 2018-10-19 NOTE — Patient Instructions (Addendum)
Medication Instructions:   Your physician recommends that you continue on your current medications as directed. Please refer to the Current Medication list given to you today.  Labwork:  NONE  Testing/Procedures:  NONE  Follow-Up:  Your physician recommends that you schedule a follow-up appointment in: 6 months for an office visit. You will receive a reminder letter in the mail in about 4 months reminding you to call and schedule your appointment. If you don't receive this letter, please contact our office.  Any Other Special Instructions Will Be Listed Below (If Applicable).  If you need a refill on your cardiac medications before your next appointment, please call your pharmacy.

## 2018-10-27 ENCOUNTER — Ambulatory Visit: Payer: Self-pay | Admitting: Cardiology

## 2019-02-15 DIAGNOSIS — R609 Edema, unspecified: Secondary | ICD-10-CM | POA: Diagnosis not present

## 2019-02-17 DIAGNOSIS — H5213 Myopia, bilateral: Secondary | ICD-10-CM | POA: Diagnosis not present

## 2019-03-18 ENCOUNTER — Other Ambulatory Visit: Payer: Self-pay | Admitting: Physician Assistant

## 2019-03-18 DIAGNOSIS — F322 Major depressive disorder, single episode, severe without psychotic features: Secondary | ICD-10-CM | POA: Diagnosis not present

## 2019-03-18 DIAGNOSIS — Z1231 Encounter for screening mammogram for malignant neoplasm of breast: Secondary | ICD-10-CM

## 2019-03-18 DIAGNOSIS — Z0000 Encounter for general adult medical examination without abnormal findings: Secondary | ICD-10-CM | POA: Diagnosis not present

## 2019-03-18 DIAGNOSIS — E1169 Type 2 diabetes mellitus with other specified complication: Secondary | ICD-10-CM | POA: Diagnosis not present

## 2019-03-18 DIAGNOSIS — G629 Polyneuropathy, unspecified: Secondary | ICD-10-CM | POA: Diagnosis not present

## 2019-03-18 DIAGNOSIS — I350 Nonrheumatic aortic (valve) stenosis: Secondary | ICD-10-CM | POA: Diagnosis not present

## 2019-03-18 DIAGNOSIS — K219 Gastro-esophageal reflux disease without esophagitis: Secondary | ICD-10-CM | POA: Diagnosis not present

## 2019-03-18 DIAGNOSIS — I10 Essential (primary) hypertension: Secondary | ICD-10-CM | POA: Diagnosis not present

## 2019-03-18 DIAGNOSIS — M109 Gout, unspecified: Secondary | ICD-10-CM | POA: Diagnosis not present

## 2019-03-18 DIAGNOSIS — E7800 Pure hypercholesterolemia, unspecified: Secondary | ICD-10-CM | POA: Diagnosis not present

## 2019-03-23 DIAGNOSIS — I10 Essential (primary) hypertension: Secondary | ICD-10-CM | POA: Diagnosis not present

## 2019-03-23 DIAGNOSIS — E7800 Pure hypercholesterolemia, unspecified: Secondary | ICD-10-CM | POA: Diagnosis not present

## 2019-03-23 DIAGNOSIS — Z23 Encounter for immunization: Secondary | ICD-10-CM | POA: Diagnosis not present

## 2019-03-23 DIAGNOSIS — M109 Gout, unspecified: Secondary | ICD-10-CM | POA: Diagnosis not present

## 2019-03-23 DIAGNOSIS — E1169 Type 2 diabetes mellitus with other specified complication: Secondary | ICD-10-CM | POA: Diagnosis not present

## 2019-05-09 ENCOUNTER — Ambulatory Visit: Payer: Medicare HMO

## 2019-05-17 DIAGNOSIS — Z7189 Other specified counseling: Secondary | ICD-10-CM | POA: Insufficient documentation

## 2019-05-17 NOTE — Progress Notes (Signed)
Cardiology Office Note   Date:  05/18/2019   ID:  Nancy Oliver, DOB 05-29-1961, MRN [id number]  PCP:  Lennie Odor, PA-C  Cardiologist:   Minus Breeding, MD   Chief Complaint  Patient presents with  . Aortic Stenosis      History of Present Illness: Nancy Oliver is a 58 y.o. female who presents for follow up of murmur.  She also was found apparently to have some ectopy though I do not have an EKG.  She had an echo in March with an EF of 60 - 65% with some mild aortic stenosis with a tricuspid valve but with some calcification.    Since I met her virtual visit she has done well.  She has been walking.  She is not having any new symptoms. The patient denies any new symptoms such as chest discomfort, neck or arm discomfort. There has been no new shortness of breath, PND or orthopnea. There have been no reported palpitations, presyncope or syncope.  She was having some atypical chest pain at the last visit but she is not describing that currently.   Past Medical History:  Diagnosis Date  . Anxiety   . Arthritis   . Depression   . Diabetes mellitus without complication ([place])   . Hyperlipidemia   . Hypertension   . Migraines   . Obesity   . Right sided sciatica     Past Surgical History:  Procedure Laterality Date  . BREAST SURGERY Left 04/2014   biopsy   . CESAREAN SECTION    . KNEE SURGERY    . NASAL SEPTOPLASTY W/ TURBINOPLASTY       Current Outpatient Medications  Medication Sig Dispense Refill  . allopurinol (ZYLOPRIM) 300 MG tablet Take 1 tablet by mouth daily.    [name] ALPRAZolam (XANAX) 0.5 MG tablet TAKE ONE TABLET BY MOUTH EVERY 8 HOURS AS NEEDED 60 tablet 0  . aspirin-acetaminophen-caffeine (EXCEDRIN MIGRAINE) 149-702-63 MG per tablet Take 1 tablet by mouth every 6 (six) hours as needed. For pain    . atorvastatin (LIPITOR) 40 MG tablet Take 40 mg by mouth daily.    . Dulaglutide (TRULICITY) 7.85 YI/5.0YD SOPN Inject 0.5 mLs into the skin once a week.     . furosemide (LASIX) 20 MG tablet Take 1 tablet by mouth daily.    [name] gabapentin (NEURONTIN) 100 MG capsule Take 1 capsule by mouth at bedtime. Or up to twice daily as needed    . LOSARTAN POTASSIUM PO Take 5 mg by mouth daily.    . metFORMIN (GLUCOPHAGE) 1000 MG tablet Take 1,000 mg by mouth 2 (two) times daily with a meal.    . omeprazole (PRILOSEC) 40 MG capsule Take 40 mg by mouth daily.    . sertraline (ZOLOFT) 50 MG tablet Take 1/2 tablet by mouth daily    . traMADol (ULTRAM) 50 MG tablet Take 50 mg by mouth daily.    . TRESIBA FLEXTOUCH 100 UNIT/ML SOPN FlexTouch Pen Inject 18 Units into the skin daily.     No current facility-administered medications for this visit.     Allergies:   Patient has no known allergies.    ROS:  Please see the history of present illness.   Otherwise, review of systems are positive for none.   All other systems are reviewed and negative.    PHYSICAL EXAM: VS:  BP (!) 158/98   Pulse 84   Ht 5' 6"  (1.676 m)   Wt [name])  302 lb (137 kg)   LMP 10/07/2012   BMI 48.74 kg/m  , BMI Body mass index is 48.74 kg/m. GENERAL:  Well appearing NECK:  No jugular venous distention, waveform within normal limits, carotid upstroke brisk and symmetric, no bruits, no thyromegaly LUNGS:  Clear to auscultation bilaterally CHEST:  Unremarkable HEART:  PMI not displaced or sustained,S1 and S2 within normal limits, no S3, no S4, no clicks, no rubs, soft brief apical systolic murmur radiating slightly at the aortic outflow tract, no diastolic murmurs ABD:  Flat, positive bowel sounds normal in frequency in pitch, no bruits, no rebound, no guarding, no midline pulsatile mass, no hepatomegaly, no splenomegaly EXT:  2 plus pulses throughout, no edema, no cyanosis no clubbing   EKG:  EKG is ordered today. The ekg ordered today demonstrates sinus rhythm, rate 84, axis within normal limits, intervals within normal limits, no acute ST-T wave changes.   Recent Labs: No results  found for requested labs within last 8760 hours.    Lipid Panel    Component Value Date/Time   CHOL 189 06/17/2013 0946   TRIG 82 06/17/2013 0946   HDL 56 06/17/2013 0946   CHOLHDL 3.4 06/17/2013 0946   VLDL 16 06/17/2013 0946   LDLCALC 117 (H) 06/17/2013 0946      Wt Readings from Last 3 Encounters:  05/18/19 (!) 302 lb (137 kg)  10/19/18 280 lb (127 kg)  11/01/17 300 lb (136.1 kg)      Other studies Reviewed: Additional studies/ records that were reviewed today include: Labs. Review of the above records demonstrates:  Please see elsewhere in the note.     ASSESSMENT AND PLAN:  AS:  This was mild on echo.  No change in therapy.  I will follow this clinically.   CHEST PAIN:  She is no longer reporting chest discomfort.  No change in therapy.   HTN:   Blood pressure is slightly elevated.  We talked about this.  She is going to use her blood pressure cuff which is a wrist cuff to check a diary but she should bring this into correlated with our readings.   DYSLIPIDEMIA: LDL is 81 with an HDL of 50.  This is better than previous.  No change in therapy.   COVID EDUCATION: We talked about the vaccine.   Current medicines are reviewed at length with the patient today.  The patient does not have concerns regarding medicines.  The following changes have been made:  no change  Labs/ tests ordered today include: None  Orders Placed This Encounter  Procedures  . EKG 12-Lead     Disposition:   FU with me in 12 months.     Signed, Minus Breeding, MD  05/18/2019 3:01 PM    [facility]

## 2019-05-18 ENCOUNTER — Ambulatory Visit (INDEPENDENT_AMBULATORY_CARE_PROVIDER_SITE_OTHER): Payer: Medicare HMO | Admitting: Cardiology

## 2019-05-18 ENCOUNTER — Other Ambulatory Visit: Payer: Self-pay

## 2019-05-18 ENCOUNTER — Encounter: Payer: Self-pay | Admitting: Cardiology

## 2019-05-18 VITALS — BP 158/98 | HR 84 | Ht 66.0 in | Wt 302.0 lb

## 2019-05-18 DIAGNOSIS — I10 Essential (primary) hypertension: Secondary | ICD-10-CM | POA: Diagnosis not present

## 2019-05-18 DIAGNOSIS — Z7189 Other specified counseling: Secondary | ICD-10-CM

## 2019-05-18 DIAGNOSIS — E785 Hyperlipidemia, unspecified: Secondary | ICD-10-CM | POA: Diagnosis not present

## 2019-05-18 DIAGNOSIS — I350 Nonrheumatic aortic (valve) stenosis: Secondary | ICD-10-CM

## 2019-05-18 DIAGNOSIS — R0789 Other chest pain: Secondary | ICD-10-CM

## 2019-05-18 DIAGNOSIS — R079 Chest pain, unspecified: Secondary | ICD-10-CM

## 2019-05-18 NOTE — Patient Instructions (Signed)
Medication Instructions:  The current medical regimen is effective;  continue present plan and medications.  *If you need a refill on your cardiac medications before your next appointment, please call your pharmacy*  Follow-Up: At [facility], you and your health needs are our priority.  As part of our continuing mission to provide you with exceptional heart care, we have created designated Provider Care Teams.  These Care Teams include your primary Cardiologist (physician) and Advanced Practice Providers (APPs -  Physician Assistants and Nurse Practitioners) who all work together to provide you with the care you need, when you need it.  Your next appointment:   12 month(s)  The format for your next appointment:   In Person  Provider:   James Hochrein, MD  Thank you for choosing [facility]!!     

## 2019-05-19 ENCOUNTER — Telehealth: Payer: Self-pay | Admitting: Cardiology

## 2019-05-19 MED ORDER — LOSARTAN POTASSIUM 25 MG PO TABS: 12.5000 mg | ORAL_TABLET | Freq: Every day | ORAL | 3 refills | Status: DC

## 2019-05-19 NOTE — Telephone Encounter (Signed)
Patient called stating that Dr. Percival Spanish requested to know the mg of medication Losartan .

## 2019-05-19 NOTE — Telephone Encounter (Signed)
Correct dose of Losartan is 12.5 mg daily.I corrected in patients medication list.

## 2019-06-29 ENCOUNTER — Ambulatory Visit: Payer: Medicare HMO

## 2019-06-30 ENCOUNTER — Telehealth: Payer: Self-pay | Admitting: Cardiology

## 2019-06-30 NOTE — Telephone Encounter (Signed)
Spoke with Nancy Oliver and Nancy Oliver continues to c/o SOB ( has to stop several times and catch breath),chest hurting , and fatigue. Nancy Oliver discussed this with Dr Percival Spanish at 05/18/19 office visit and had just started a walking regimen Nancy Oliver was told to continue as this may get better Per Nancy Oliver no improvement noted and therefore has not walked the last few days symptoms have subsided other than the fatigue has lingered appointment made with Dr Percival Spanish for 1/215/21 at 10:20 am Instructed if S/S return or worsen to go to ED for eval Nancy Oliver verbalizes understanding .Adonis Housekeeper

## 2019-06-30 NOTE — Telephone Encounter (Signed)
°  Pt c/o Shortness Of Breath: STAT if SOB developed within the last 24 hours or pt is noticeably SOB on the phone  1. Are you currently SOB (can you hear that pt is SOB on the phone)? no  2. How long have you been experiencing SOB? Since December  3. Are you SOB when sitting or when up moving around? Moving around  4. Are you currently experiencing any other symptoms? No  Patient states she started walking to lose weight in December , but was getting out of breath and her chest would hurt. She states it has gotten worse so she stopped walking. She would like a call back with advice.

## 2019-07-03 DIAGNOSIS — R0602 Shortness of breath: Secondary | ICD-10-CM | POA: Insufficient documentation

## 2019-07-03 NOTE — Progress Notes (Deleted)
Cardiology Office Note   Date:  07/03/2019   ID:  Nancy Oliver, DOB 12-05-1960, MRN [id number]  PCP:  Lennie Odor, PA-C  Cardiologist:   Minus Breeding, MD   No chief complaint on file.     History of Present Illness: Nancy Oliver is a 59 y.o. female who presents for follow up of murmur.  She also was found apparently to have some ectopy though I do not have an EKG.  She had an echo in March with an EF of 60 - 65% with some mild aortic stenosis with a tricuspid valve but with some calcification.    She called recently and has been having dyspnea and chest pain.  ***    ***Since I met her virtual visit she has done well.  She has been walking.  She is not having any new symptoms. The patient denies any new symptoms such as chest discomfort, neck or arm discomfort. There has been no new shortness of breath, PND or orthopnea. There have been no reported palpitations, presyncope or syncope.  She was having some atypical chest pain at the last visit but she is not describing that currently.   Past Medical History:  Diagnosis Date  . Anxiety   . Arthritis   . Depression   . Diabetes mellitus without complication ([place])   . Hyperlipidemia   . Hypertension   . Migraines   . Obesity   . Right sided sciatica     Past Surgical History:  Procedure Laterality Date  . BREAST SURGERY Left 04/2014   biopsy   . CESAREAN SECTION    . KNEE SURGERY    . NASAL SEPTOPLASTY W/ TURBINOPLASTY       Current Outpatient Medications  Medication Sig Dispense Refill  . allopurinol (ZYLOPRIM) 300 MG tablet Take 1 tablet by mouth daily.    [name] ALPRAZolam (XANAX) 0.5 MG tablet TAKE ONE TABLET BY MOUTH EVERY 8 HOURS AS NEEDED 60 tablet 0  . aspirin-acetaminophen-caffeine (EXCEDRIN MIGRAINE) 220-254-27 MG per tablet Take 1 tablet by mouth every 6 (six) hours as needed. For pain    . atorvastatin (LIPITOR) 40 MG tablet Take 40 mg by mouth daily.    . Dulaglutide (TRULICITY) 0.62 BJ/6.2GB  SOPN Inject 0.5 mLs into the skin once a week.    . furosemide (LASIX) 20 MG tablet Take 1 tablet by mouth daily.    [name] gabapentin (NEURONTIN) 100 MG capsule Take 1 capsule by mouth at bedtime. Or up to twice daily as needed    . losartan (COZAAR) 25 MG tablet Take 0.5 tablets (12.5 mg total) by mouth daily. 45 tablet 3  . metFORMIN (GLUCOPHAGE) 1000 MG tablet Take 1,000 mg by mouth 2 (two) times daily with a meal.    . omeprazole (PRILOSEC) 40 MG capsule Take 40 mg by mouth daily.    . sertraline (ZOLOFT) 50 MG tablet Take 1/2 tablet by mouth daily    . traMADol (ULTRAM) 50 MG tablet Take 50 mg by mouth daily.    . TRESIBA FLEXTOUCH 100 UNIT/ML SOPN FlexTouch Pen Inject 18 Units into the skin daily.     No current facility-administered medications for this visit.    Allergies:   Patient has no known allergies.    ROS:  Please see the history of present illness.   Otherwise, review of systems are positive for ***.   All other systems are reviewed and negative.    PHYSICAL EXAM: VS:  LMP 10/07/2012  ,  BMI There is no height or weight on file to calculate BMI. GENERAL:  Well appearing NECK:  No jugular venous distention, waveform within normal limits, carotid upstroke brisk and symmetric, no bruits, no thyromegaly LUNGS:  Clear to auscultation bilaterally CHEST:  Unremarkable HEART:  PMI not displaced or sustained,S1 and S2 within normal limits, no S3, no S4, no clicks, no rubs, *** murmurs ABD:  Flat, positive bowel sounds normal in frequency in pitch, no bruits, no rebound, no guarding, no midline pulsatile mass, no hepatomegaly, no splenomegaly EXT:  2 plus pulses throughout, no edema, no cyanosis no clubbing     ***ENERAL:  Well appearing NECK:  No jugular venous distention, waveform within normal limits, carotid upstroke brisk and symmetric, no bruits, no thyromegaly LUNGS:  Clear to auscultation bilaterally CHEST:  Unremarkable HEART:  PMI not displaced or sustained,S1 and S2  within normal limits, no S3, no S4, no clicks, no rubs, soft brief apical systolic murmur radiating slightly at the aortic outflow tract, no diastolic murmurs ABD:  Flat, positive bowel sounds normal in frequency in pitch, no bruits, no rebound, no guarding, no midline pulsatile mass, no hepatomegaly, no splenomegaly EXT:  2 plus pulses throughout, no edema, no cyanosis no clubbing   EKG:  EKG is *** ordered today. The ekg ordered today demonstrates sinus rhythm, rate ***, axis within normal limits, intervals within normal limits, no acute ST-T wave changes.   Recent Labs: No results found for requested labs within last 8760 hours.    Lipid Panel    Component Value Date/Time   CHOL 189 06/17/2013 0946   TRIG 82 06/17/2013 0946   HDL 56 06/17/2013 0946   CHOLHDL 3.4 06/17/2013 0946   VLDL 16 06/17/2013 0946   LDLCALC 117 (H) 06/17/2013 0946      Wt Readings from Last 3 Encounters:  05/18/19 (!) 302 lb (137 kg)  10/19/18 280 lb (127 kg)  11/01/17 300 lb (136.1 kg)      Other studies Reviewed: Additional studies/ records that were reviewed today include: *** Review of the above records demonstrates:  Please see elsewhere in the note.     ASSESSMENT AND PLAN:  AS:  This was mild on echo.  *** No change in therapy.  I will follow this clinically.   DYSPNEA:  ***   CHEST PAIN:  *** She is no longer reporting chest discomfort.  No change in therapy.   HTN:   Blood pressure is *** slightly elevated.  We talked about this.  She is going to use her blood pressure cuff which is a wrist cuff to check a diary but she should bring this into correlated with our readings.   DYSLIPIDEMIA: LDL is *** 81 with an HDL of 50.  This is better than previous.  No change in therapy.   COVID EDUCATION: *** We talked about the vaccine.   Current medicines are reviewed at length with the patient today.  The patient does not have concerns regarding medicines.  The following changes have  been made:  ***  Labs/ tests ordered today include: ***  No orders of the defined types were placed in this encounter.    Disposition:   FU with me in *** months.     Signed, Minus Breeding, MD  07/03/2019 7:34 PM    [facility]

## 2019-07-04 ENCOUNTER — Ambulatory Visit: Payer: Medicare HMO | Admitting: Cardiology

## 2019-07-06 ENCOUNTER — Ambulatory Visit: Payer: Medicare HMO | Admitting: Cardiology

## 2019-07-10 NOTE — Progress Notes (Signed)
Cardiology Office Note   Date:  07/11/2019   ID:  KATLYNE BRIDENBAUGH, DOB [birth year], MRN [id number]  PCP:  Lennie Odor, PA  Cardiologist:   Minus Breeding, MD   Chief Complaint  Patient presents with  . Chest Pain      History of Present Illness: Nancy Oliver is a 59 y.o. female who presents for follow up of murmur.  She also was found apparently to have some ectopy though I do not have an EKG.  She had an echo in March with an EF of 60 - 65% with some mild aortic stenosis with a tricuspid valve but with some calcification.    She called recently and has been having dyspnea and chest pain.  She said that she tried to increase her walking as was discussed but she can get about 50 yards and she is short of breath.  She has to stop doing and stop for 30 seconds to a minute to catch her breath.  She will feel a throbbing in her chest and some mid chest discomfort.  She has no radiation to her jaw or to her arms.  She does have some shortness of breath when she tries to lie flat at night and chronically sleeps in a chair.  However, she does not have any resting chest discomfort.  She says the discomfort she does get the is probably 7 out of 10 and again goes away in 30 seconds to a minute.  There is no associated nausea vomiting or diaphoresis.    Past Medical History:  Diagnosis Date  . Anxiety   . Arthritis   . Depression   . Diabetes mellitus without complication ([place])   . Hyperlipidemia   . Hypertension   . Migraines   . Obesity   . Right sided sciatica     Past Surgical History:  Procedure Laterality Date  . BREAST SURGERY Left 04/2014   biopsy   . CESAREAN SECTION    . KNEE SURGERY    . NASAL SEPTOPLASTY W/ TURBINOPLASTY       Current Outpatient Medications  Medication Sig Dispense Refill  . allopurinol (ZYLOPRIM) 300 MG tablet Take 1 tablet by mouth daily.    [name] ALPRAZolam (XANAX) 0.5 MG tablet TAKE ONE TABLET BY MOUTH EVERY 8 HOURS AS NEEDED 60 tablet 0  .  aspirin-acetaminophen-caffeine (EXCEDRIN MIGRAINE) [id number] MG per tablet Take 1 tablet by mouth every 6 (six) hours as needed. For pain    . atorvastatin (LIPITOR) 40 MG tablet Take 40 mg by mouth daily.    . Dulaglutide (TRULICITY) [id number] SOPN Inject 0.5 mLs into the skin once a week.    . furosemide (LASIX) 20 MG tablet Take 20 mg by mouth.    . gabapentin (NEURONTIN) 100 MG capsule Take 1 capsule by mouth at bedtime. Or up to twice daily as needed    . losartan (COZAAR) 25 MG tablet Take 0.5 tablets (12.5 mg total) by mouth daily. 45 tablet 3  . metFORMIN (GLUCOPHAGE) 1000 MG tablet Take 1,000 mg by mouth 2 (two) times daily with a meal.    . omeprazole (PRILOSEC) 40 MG capsule Take 40 mg by mouth daily.    . sertraline (ZOLOFT) 50 MG tablet Take 1/2 tablet by mouth daily    . traMADol (ULTRAM) 50 MG tablet Take 50 mg by mouth daily.    . TRESIBA FLEXTOUCH 100 UNIT/ML SOPN FlexTouch Pen Inject 18 Units into the skin daily.  No current facility-administered medications for this visit.    Allergies:   Patient has no known allergies.    ROS:  Please see the history of present illness.   Otherwise, review of systems are positive for that she cannot overdose.   All other systems are reviewed and negative.    PHYSICAL EXAM: VS:  BP 118/84   Pulse 80   Temp 98.1 F (36.7 C) (Temporal)   Ht 5\' 6"  (1.676 m)   Wt 300 lb (136.1 kg)   LMP 10/07/2012   BMI 48.42 kg/m  , BMI Body mass index is 48.42 kg/m. GENERAL:  Well appearing NECK:  No jugular venous distention, waveform within normal limits, carotid upstroke brisk and symmetric, no bruits, no thyromegaly LUNGS:  Clear to auscultation bilaterally CHEST:  Unremarkable HEART:  PMI not displaced or sustained,S1 and S2 within normal limits, no S3, no S4, no clicks, no rubs, 3/6 apical systolic murmur radiating to the outflow tract, no diastolic murmurs ABD:  Flat, positive bowel sounds normal in frequency in pitch, no bruits,  no rebound, no guarding, no midline pulsatile mass, no hepatomegaly, no splenomegaly EXT:  2 plus pulses throughout, no edema, no cyanosis no clubbing   EKG:  EKG is  ordered today. The ekg ordered today demonstrates sinus rhythm, rate 80, axis within normal limits, intervals within normal limits, no acute ST-T wave changes.   Recent Labs: No results found for requested labs within last 8760 hours.    Lipid Panel    Component Value Date/Time   CHOL 189 06/17/2013 0946   TRIG 82 06/17/2013 0946   HDL 56 06/17/2013 0946   CHOLHDL 3.4 06/17/2013 0946   VLDL 16 06/17/2013 0946   LDLCALC 117 (H) 06/17/2013 0946      Wt Readings from Last 3 Encounters:  07/11/19 300 lb (136.1 kg)  05/18/19 (!) 302 lb (137 kg)  10/19/18 280 lb (127 kg)      Other studies Reviewed: Additional studies/ records that were reviewed today include: None Review of the above records demonstrates:  Please see elsewhere in the note.     ASSESSMENT AND PLAN:  AS:  This was mild on echo.  I will follow this clinically.   DYSPNEA: I think this is multifactorial.  The echo did suggest some probable diastolic dysfunction.  She has morbid obesity.  I will evaluate however as below to exclude an ischemic etiology.  If the testing below is negative for any clear ischemia she probably should be referred to pulmonary.  CHEST PAIN:  She has significant cardiovascular risk factors.  She needs to be screened with a stress test.  She would be able to walk on a treadmill.  Therefore, she will have a [employer].  HTN:   Blood pressure is at target.  No change in therapy.   DYSLIPIDEMIA: LDL is 81 with an HDL of 54.  No change in therapy.   DM: A1c is 7.4.  She understands the goal of therapy and she will continue to follow with Redmon, Barth Kirks, PA  COVID EDUCATION:    We talked about the vaccine.   Current medicines are reviewed at length with the patient today.  The patient does not have concerns regarding  medicines.  The following changes have been made:  None  Labs/ tests ordered today include:   Orders Placed This Encounter  Procedures  . MYOCARDIAL PERFUSION IMAGING  . EKG 12-Lead     Disposition:   FU with me 12  months.     Signed, Minus Breeding, MD  07/11/2019 10:52 AM    [facility]

## 2019-07-11 ENCOUNTER — Encounter: Payer: Self-pay | Admitting: Cardiology

## 2019-07-11 ENCOUNTER — Ambulatory Visit (INDEPENDENT_AMBULATORY_CARE_PROVIDER_SITE_OTHER): Payer: Medicare HMO | Admitting: Cardiology

## 2019-07-11 ENCOUNTER — Other Ambulatory Visit: Payer: Self-pay

## 2019-07-11 VITALS — BP 118/84 | HR 80 | Temp 98.1°F | Ht 66.0 in | Wt 300.0 lb

## 2019-07-11 DIAGNOSIS — R072 Precordial pain: Secondary | ICD-10-CM

## 2019-07-11 DIAGNOSIS — I10 Essential (primary) hypertension: Secondary | ICD-10-CM

## 2019-07-11 DIAGNOSIS — I350 Nonrheumatic aortic (valve) stenosis: Secondary | ICD-10-CM | POA: Diagnosis not present

## 2019-07-11 DIAGNOSIS — Z7189 Other specified counseling: Secondary | ICD-10-CM | POA: Diagnosis not present

## 2019-07-11 DIAGNOSIS — E785 Hyperlipidemia, unspecified: Secondary | ICD-10-CM | POA: Diagnosis not present

## 2019-07-11 DIAGNOSIS — R0602 Shortness of breath: Secondary | ICD-10-CM | POA: Diagnosis not present

## 2019-07-11 NOTE — Patient Instructions (Signed)
Medication Instructions:  No Changes *If you need a refill on your cardiac medications before your next appointment, please call your pharmacy*  Lab Work: None  Testing/Procedures: Your physician has requested that you have a lexiscan myoview. For further information please visit [email or web address]. Please follow instruction sheet, as given.   Follow-Up: At [facility], you and your health needs are our priority.  As part of our continuing mission to provide you with exceptional heart care, we have created designated Provider Care Teams.  These Care Teams include your primary Cardiologist (physician) and Advanced Practice Providers (APPs -  Physician Assistants and Nurse Practitioners) who all work together to provide you with the care you need, when you need it.  Your next appointment:   1 year(s)  You will receive a reminder letter in the mail two months in advance. If you don't receive a letter, please call our office to schedule the follow-up appointment.  The format for your next appointment:   In Person  Provider:   Minus Breeding, MD

## 2019-07-12 ENCOUNTER — Other Ambulatory Visit: Payer: Self-pay | Admitting: Physician Assistant

## 2019-07-12 ENCOUNTER — Ambulatory Visit
Admission: RE | Admit: 2019-07-12 | Discharge: 2019-07-12 | Disposition: A | Discharge status: Home / Self Care | Payer: Medicare HMO | Source: Ambulatory Visit | Attending: Physician Assistant | Admitting: Physician Assistant

## 2019-07-12 DIAGNOSIS — M25562 Pain in left knee: Secondary | ICD-10-CM

## 2019-07-12 DIAGNOSIS — M1712 Unilateral primary osteoarthritis, left knee: Secondary | ICD-10-CM | POA: Diagnosis not present

## 2019-07-12 DIAGNOSIS — M545 Low back pain: Secondary | ICD-10-CM | POA: Diagnosis not present

## 2019-07-14 ENCOUNTER — Telehealth (HOSPITAL_COMMUNITY): Payer: Self-pay

## 2019-07-14 NOTE — Telephone Encounter (Signed)
Encounter complete. 

## 2019-07-19 ENCOUNTER — Encounter (HOSPITAL_COMMUNITY): Payer: Self-pay

## 2019-07-19 ENCOUNTER — Ambulatory Visit (HOSPITAL_COMMUNITY)
Admission: RE | Admit: 2019-07-19 | Payer: Medicare HMO | Source: Ambulatory Visit | Attending: Cardiology | Admitting: Cardiology

## 2019-07-19 ENCOUNTER — Other Ambulatory Visit: Payer: Self-pay

## 2019-07-20 ENCOUNTER — Ambulatory Visit (HOSPITAL_COMMUNITY): Payer: Medicare HMO

## 2019-08-02 ENCOUNTER — Other Ambulatory Visit: Payer: Self-pay

## 2019-08-02 ENCOUNTER — Ambulatory Visit
Admission: RE | Admit: 2019-08-02 | Discharge: 2019-08-02 | Disposition: A | Discharge status: Home / Self Care | Payer: Medicare HMO | Source: Ambulatory Visit | Attending: Physician Assistant | Admitting: Physician Assistant

## 2019-08-02 DIAGNOSIS — Z1231 Encounter for screening mammogram for malignant neoplasm of breast: Secondary | ICD-10-CM | POA: Diagnosis not present

## 2019-10-20 DIAGNOSIS — Z6841 Body Mass Index (BMI) 40.0 and over, adult: Secondary | ICD-10-CM | POA: Diagnosis not present

## 2019-10-20 DIAGNOSIS — M5430 Sciatica, unspecified side: Secondary | ICD-10-CM | POA: Diagnosis not present

## 2019-10-20 DIAGNOSIS — E1140 Type 2 diabetes mellitus with diabetic neuropathy, unspecified: Secondary | ICD-10-CM | POA: Diagnosis not present

## 2019-10-20 DIAGNOSIS — M1990 Unspecified osteoarthritis, unspecified site: Secondary | ICD-10-CM | POA: Diagnosis not present

## 2019-10-20 DIAGNOSIS — G629 Polyneuropathy, unspecified: Secondary | ICD-10-CM | POA: Diagnosis not present

## 2019-11-30 DIAGNOSIS — M5430 Sciatica, unspecified side: Secondary | ICD-10-CM | POA: Diagnosis not present

## 2019-11-30 DIAGNOSIS — M1990 Unspecified osteoarthritis, unspecified site: Secondary | ICD-10-CM | POA: Diagnosis not present

## 2019-11-30 DIAGNOSIS — G629 Polyneuropathy, unspecified: Secondary | ICD-10-CM | POA: Diagnosis not present

## 2019-12-06 DIAGNOSIS — M1712 Unilateral primary osteoarthritis, left knee: Secondary | ICD-10-CM | POA: Diagnosis not present

## 2019-12-06 DIAGNOSIS — M25562 Pain in left knee: Secondary | ICD-10-CM | POA: Diagnosis not present

## 2019-12-31 DIAGNOSIS — M25562 Pain in left knee: Secondary | ICD-10-CM | POA: Diagnosis not present

## 2020-01-11 DIAGNOSIS — M25562 Pain in left knee: Secondary | ICD-10-CM | POA: Diagnosis not present

## 2020-07-31 DIAGNOSIS — M5432 Sciatica, left side: Secondary | ICD-10-CM | POA: Diagnosis not present

## 2020-07-31 DIAGNOSIS — E1169 Type 2 diabetes mellitus with other specified complication: Secondary | ICD-10-CM | POA: Diagnosis not present

## 2020-07-31 DIAGNOSIS — M25552 Pain in left hip: Secondary | ICD-10-CM | POA: Diagnosis not present

## 2020-07-31 DIAGNOSIS — Z6841 Body Mass Index (BMI) 40.0 and over, adult: Secondary | ICD-10-CM | POA: Diagnosis not present

## 2020-07-31 DIAGNOSIS — F339 Major depressive disorder, recurrent, unspecified: Secondary | ICD-10-CM | POA: Diagnosis not present

## 2020-07-31 DIAGNOSIS — I10 Essential (primary) hypertension: Secondary | ICD-10-CM | POA: Diagnosis not present

## 2020-08-23 ENCOUNTER — Other Ambulatory Visit: Payer: Self-pay | Admitting: Physician Assistant

## 2020-08-23 DIAGNOSIS — Z1231 Encounter for screening mammogram for malignant neoplasm of breast: Secondary | ICD-10-CM

## 2020-08-28 ENCOUNTER — Other Ambulatory Visit: Payer: Self-pay

## 2020-08-28 ENCOUNTER — Ambulatory Visit
Admission: RE | Admit: 2020-08-28 | Discharge: 2020-08-28 | Disposition: A | Discharge status: Home / Self Care | Payer: Medicare HMO | Source: Ambulatory Visit | Attending: Physician Assistant | Admitting: Physician Assistant

## 2020-08-28 DIAGNOSIS — Z1231 Encounter for screening mammogram for malignant neoplasm of breast: Secondary | ICD-10-CM | POA: Diagnosis not present

## 2020-08-30 DIAGNOSIS — E118 Type 2 diabetes mellitus with unspecified complications: Secondary | ICD-10-CM | POA: Insufficient documentation

## 2020-08-30 NOTE — Progress Notes (Unsigned)
Cardiology Office Note   Date:  08/31/2020   ID:  Nancy Oliver, Nancy Oliver 04-16-1961, MRN [id number]  PCP:  Lennie Odor, PA  Cardiologist:   Minus Breeding, MD   Chief Complaint  Patient presents with  . Follow-up  . Shortness of Breath      History of Present Illness: Nancy Oliver is a 60 y.o. female who presents for follow up of murmur.  She also was found apparently to have some ectopy though I do not have an EKG.  She had an echo in March with an EF of 60 - 65% with some mild aortic stenosis with a tricuspid valve but with some calcification.    At the last visit she was having dyspnea and I ordered a [employer]. However, this was cancelled.  She was anxious about doing that.  She still getting short of breath.  She walks down to her dogs who are about 50 yards away.  Its only very slight incline.  She will make it sound that she knows is short of breath.  She is not really describing chest pressure, neck or arm discomfort.  She is not having any new palpitations, presyncope or syncope.  She does not have PND or orthopnea.   Past Medical History:  Diagnosis Date  . Anxiety   . Arthritis   . Depression   . Diabetes mellitus without complication ([place])   . Hyperlipidemia   . Hypertension   . Migraines   . Obesity   . Right sided sciatica     Past Surgical History:  Procedure Laterality Date  . BREAST SURGERY Left 04/2014   biopsy   . CESAREAN SECTION    . KNEE SURGERY    . NASAL SEPTOPLASTY W/ TURBINOPLASTY       Current Outpatient Medications  Medication Sig Dispense Refill  . ACCU-CHEK AVIVA PLUS test strip     . allopurinol (ZYLOPRIM) 300 MG tablet Take 1 tablet by mouth daily.    [name] ALPRAZolam (XANAX) 0.5 MG tablet TAKE ONE TABLET BY MOUTH EVERY 8 HOURS AS NEEDED 60 tablet 0  . aspirin-acetaminophen-caffeine (EXCEDRIN MIGRAINE) [id number] MG per tablet Take 1 tablet by mouth every 6 (six) hours as needed. For pain    . atorvastatin  (LIPITOR) 40 MG tablet Take 40 mg by mouth daily.    . Blood Glucose Monitoring Suppl (ACCU-CHEK AVIVA PLUS) w/Device KIT See admin instructions.    . Dulaglutide 0.75 MG/0.5ML SOPN Inject 0.5 mLs into the skin once a week.    . DULoxetine (CYMBALTA) 60 MG capsule 1 capsule    . furosemide (LASIX) 20 MG tablet Take 20 mg by mouth.    . gabapentin (NEURONTIN) 100 MG capsule Take 1 capsule by mouth at bedtime. Or up to twice daily as needed    . losartan (COZAAR) 25 MG tablet Take 0.5 tablets (12.5 mg total) by mouth daily. (Patient taking differently: Take 25 mg by mouth daily.) 45 tablet 3  . meloxicam (MOBIC) 7.5 MG tablet meloxicam 7.5 mg tablet  TAKE 1 TABLET BY MOUTH ONCE DAILY AS NEEDED WITH MEALS    . metFORMIN (GLUCOPHAGE) 1000 MG tablet Take 1,000 mg by mouth 2 (two) times daily with a meal.    . omeprazole (PRILOSEC) 40 MG capsule Take 40 mg by mouth daily.    . sertraline (ZOLOFT) 50 MG tablet Take 1/2 tablet by mouth daily    . traMADol (ULTRAM) 50 MG tablet Take 50 mg by mouth  daily.    . TRESIBA FLEXTOUCH 100 UNIT/ML SOPN FlexTouch Pen Inject 18 Units into the skin daily.     No current facility-administered medications for this visit.    Allergies:   Patient has no known allergies.    ROS:  Please see the history of present illness.   Otherwise, review of systems are positive none.   All other systems are reviewed and negative.    PHYSICAL EXAM: VS:  BP 134/86   Pulse 95   Ht 5' 6"  (1.676 m)   Wt 295 lb 3.2 oz (133.9 kg)   LMP 10/07/2012   SpO2 96%   BMI 47.65 kg/m  , BMI Body mass index is 47.65 kg/m. GENERAL:  Well appearing NECK:  No jugular venous distention, waveform within normal limits, carotid upstroke brisk and symmetric, no bruits, no thyromegaly LUNGS:  Clear to auscultation bilaterally CHEST:  Unremarkable HEART:  PMI not displaced or sustained,S1 and S2 within normal limits, no S3, no S4, no clicks, no rubs, 2 out of 6 brief apical systolic murmur  radiating slightly at the aortic outflow tract, no diastolic murmurs ABD:  Flat, positive bowel sounds normal in frequency in pitch, no bruits, no rebound, no guarding, no midline pulsatile mass, no hepatomegaly, no splenomegaly EXT:  2 plus pulses throughout, no edema, no cyanosis no clubbing   EKG:  EKG is  ordered today. The ekg ordered today demonstrates sinus rhythm, rate 95, axis within normal limits, intervals within normal limits, no acute ST-T wave changes.   Recent Labs: No results found for requested labs within last 8760 hours.    Lipid Panel    Component Value Date/Time   CHOL 189 06/17/2013 0946   TRIG 82 06/17/2013 0946   HDL 56 06/17/2013 0946   CHOLHDL 3.4 06/17/2013 0946   VLDL 16 06/17/2013 0946   LDLCALC 117 (H) 06/17/2013 0946      Wt Readings from Last 3 Encounters:  08/31/20 295 lb 3.2 oz (133.9 kg)  07/11/19 300 lb (136.1 kg)  05/18/19 (!) 302 lb (137 kg)      Other studies Reviewed: Additional studies/ records that were reviewed today include: None Review of the above records demonstrates:  NA   ASSESSMENT AND PLAN:  AS:  This was mild on echo in 2020.  I will follow this clinically.  No echo is indicated at this point.  DYSPNEA: This likely is multifactorial and we talked about it again.  Given her risk factors and this she needs stress testing but she does not want a Lexiscan Myoview and I think is very reasonable to order a POET (Plain Old Exercise Treadmill)  HTN:   Blood pressure is at target.  No change in therapy.   DYSLIPIDEMIA:   Her LDL previously was 81 but I do not have recent labs and she is going to have this done by her primary provider.   DM:    The last A1c that I can see was 7.4.  She understands the goal of therapy and she will continue to follow with Redmon, Noelle, PA    Current medicines are reviewed at length with the patient today.  The patient does not have concerns regarding medicines.  The following changes have  been made:  None  Labs/ tests ordered today include:   Orders Placed This Encounter  Procedures  . EXERCISE TOLERANCE TEST (ETT)  . EKG 12-Lead     Disposition:   FU with me 12  months.  Signed, Minus Breeding, MD  08/31/2020 3:40 PM    [facility]

## 2020-08-31 ENCOUNTER — Other Ambulatory Visit: Payer: Self-pay

## 2020-08-31 ENCOUNTER — Encounter: Payer: Self-pay | Admitting: Cardiology

## 2020-08-31 ENCOUNTER — Ambulatory Visit: Payer: Medicare HMO | Admitting: Cardiology

## 2020-08-31 ENCOUNTER — Other Ambulatory Visit: Payer: Self-pay | Admitting: Physician Assistant

## 2020-08-31 VITALS — BP 134/86 | HR 95 | Ht 66.0 in | Wt 295.2 lb

## 2020-08-31 DIAGNOSIS — R0602 Shortness of breath: Secondary | ICD-10-CM

## 2020-08-31 DIAGNOSIS — E785 Hyperlipidemia, unspecified: Secondary | ICD-10-CM

## 2020-08-31 DIAGNOSIS — R928 Other abnormal and inconclusive findings on diagnostic imaging of breast: Secondary | ICD-10-CM

## 2020-08-31 DIAGNOSIS — R079 Chest pain, unspecified: Secondary | ICD-10-CM

## 2020-08-31 DIAGNOSIS — E118 Type 2 diabetes mellitus with unspecified complications: Secondary | ICD-10-CM | POA: Diagnosis not present

## 2020-08-31 DIAGNOSIS — I10 Essential (primary) hypertension: Secondary | ICD-10-CM

## 2020-08-31 NOTE — Patient Instructions (Addendum)
Medication Instructions:  Your physician recommends that you continue on your current medications as directed. Please refer to the Current Medication list given to you today.  *If you need a refill on your cardiac medications before your next appointment, please call your pharmacy*  Lab Work: You will need a COVID-19  test prior to your procedure.This is a Drive Up Visit at [address] [place],  [address]. Someone will direct you to the appropriate testing line. Stay in your car and someone will be with you shortly.  If you have labs (blood work) drawn today and your tests are completely normal, you will receive your results only by: [name] MyChart Message (if you have MyChart) OR . A paper copy in the mail If you have any lab test that is abnormal or we need to change your treatment, we will call you to review the results.  Testing/Procedures: Your physician has requested that you have an exercise tolerance test. For further information please visit [email or web address]. Please also follow instruction sheet, as given.  Follow-Up: At [facility], you and your health needs are our priority.  As part of our continuing mission to provide you with exceptional heart care, we have created designated Provider Care Teams.  These Care Teams include your primary Cardiologist (physician) and Advanced Practice Providers (APPs -  Physician Assistants and Nurse Practitioners) who all work together to provide you with the care you need, when you need it.   Your next appointment:   1 year(s)  The format for your next appointment:   In Person  Provider:   Minus Breeding, MD  Other Instructions

## 2020-09-04 ENCOUNTER — Other Ambulatory Visit (HOSPITAL_COMMUNITY)
Admission: RE | Admit: 2020-09-04 | Discharge: 2020-09-04 | Disposition: A | Discharge status: Home / Self Care | Payer: Medicare HMO | Source: Ambulatory Visit | Attending: Cardiology | Admitting: Cardiology

## 2020-09-04 DIAGNOSIS — Z20822 Contact with and (suspected) exposure to covid-19: Secondary | ICD-10-CM | POA: Insufficient documentation

## 2020-09-04 DIAGNOSIS — Z01812 Encounter for preprocedural laboratory examination: Secondary | ICD-10-CM | POA: Diagnosis not present

## 2020-09-04 LAB — SARS CORONAVIRUS 2 (TAT 6-24 HRS): SARS Coronavirus 2: NEGATIVE

## 2020-09-04 NOTE — Addendum Note (Signed)
Addended by: Minus Breeding on: 09/04/2020 02:34 PM   Modules accepted: Orders

## 2020-09-04 NOTE — Addendum Note (Signed)
Addended by: Vennie Homans on: 09/04/2020 01:55 PM   Modules accepted: Orders

## 2020-09-06 ENCOUNTER — Ambulatory Visit (HOSPITAL_COMMUNITY)
Admission: RE | Admit: 2020-09-06 | Discharge: 2020-09-06 | Disposition: A | Discharge status: Home / Self Care | Payer: Medicare HMO | Source: Ambulatory Visit | Attending: Cardiology | Admitting: Cardiology

## 2020-09-06 ENCOUNTER — Other Ambulatory Visit: Payer: Self-pay

## 2020-09-06 DIAGNOSIS — R079 Chest pain, unspecified: Secondary | ICD-10-CM | POA: Diagnosis not present

## 2020-09-06 DIAGNOSIS — R0602 Shortness of breath: Secondary | ICD-10-CM | POA: Insufficient documentation

## 2020-09-06 LAB — EXERCISE TOLERANCE TEST
Estimated workload: 4.3 METS
Exercise duration (min): 1 min
Exercise duration (sec): 54 s
MPHR: 162 {beats}/min
Peak HR: 155 {beats}/min
Percent HR: 95 %
Rest HR: 108 {beats}/min

## 2020-09-14 NOTE — Telephone Encounter (Signed)
Spoke to patient advised treadmill was normal.Stated she is still having chest pain.Advised to call PCP.

## 2020-09-18 ENCOUNTER — Other Ambulatory Visit: Payer: Self-pay

## 2020-09-18 ENCOUNTER — Ambulatory Visit
Admission: RE | Admit: 2020-09-18 | Discharge: 2020-09-18 | Disposition: A | Discharge status: Home / Self Care | Payer: Medicare HMO | Source: Ambulatory Visit | Attending: Physician Assistant | Admitting: Physician Assistant

## 2020-09-18 ENCOUNTER — Ambulatory Visit: Payer: Medicare HMO

## 2020-09-18 DIAGNOSIS — R928 Other abnormal and inconclusive findings on diagnostic imaging of breast: Secondary | ICD-10-CM | POA: Diagnosis not present

## 2020-09-18 DIAGNOSIS — R922 Inconclusive mammogram: Secondary | ICD-10-CM | POA: Diagnosis not present

## 2020-11-26 DIAGNOSIS — H40051 Ocular hypertension, right eye: Secondary | ICD-10-CM | POA: Diagnosis not present

## 2020-11-26 DIAGNOSIS — Z0100 Encounter for examination of eyes and vision without abnormal findings: Secondary | ICD-10-CM | POA: Diagnosis not present

## 2020-11-26 DIAGNOSIS — H524 Presbyopia: Secondary | ICD-10-CM | POA: Diagnosis not present

## 2020-11-29 DIAGNOSIS — G629 Polyneuropathy, unspecified: Secondary | ICD-10-CM | POA: Diagnosis not present

## 2020-11-29 DIAGNOSIS — K219 Gastro-esophageal reflux disease without esophagitis: Secondary | ICD-10-CM | POA: Diagnosis not present

## 2020-11-29 DIAGNOSIS — Z7984 Long term (current) use of oral hypoglycemic drugs: Secondary | ICD-10-CM | POA: Diagnosis not present

## 2020-11-29 DIAGNOSIS — F339 Major depressive disorder, recurrent, unspecified: Secondary | ICD-10-CM | POA: Diagnosis not present

## 2020-11-29 DIAGNOSIS — E7800 Pure hypercholesterolemia, unspecified: Secondary | ICD-10-CM | POA: Diagnosis not present

## 2020-11-29 DIAGNOSIS — M109 Gout, unspecified: Secondary | ICD-10-CM | POA: Diagnosis not present

## 2020-11-29 DIAGNOSIS — F329 Major depressive disorder, single episode, unspecified: Secondary | ICD-10-CM | POA: Diagnosis not present

## 2020-11-29 DIAGNOSIS — E1169 Type 2 diabetes mellitus with other specified complication: Secondary | ICD-10-CM | POA: Diagnosis not present

## 2020-11-29 DIAGNOSIS — I10 Essential (primary) hypertension: Secondary | ICD-10-CM | POA: Diagnosis not present

## 2020-12-06 DIAGNOSIS — M79673 Pain in unspecified foot: Secondary | ICD-10-CM | POA: Diagnosis not present

## 2021-03-07 DIAGNOSIS — M7741 Metatarsalgia, right foot: Secondary | ICD-10-CM | POA: Diagnosis not present

## 2021-03-07 DIAGNOSIS — M7742 Metatarsalgia, left foot: Secondary | ICD-10-CM | POA: Diagnosis not present

## 2021-03-07 DIAGNOSIS — M79673 Pain in unspecified foot: Secondary | ICD-10-CM | POA: Diagnosis not present

## 2021-03-14 DIAGNOSIS — K219 Gastro-esophageal reflux disease without esophagitis: Secondary | ICD-10-CM | POA: Diagnosis not present

## 2021-03-14 DIAGNOSIS — I350 Nonrheumatic aortic (valve) stenosis: Secondary | ICD-10-CM | POA: Diagnosis not present

## 2021-03-14 DIAGNOSIS — I10 Essential (primary) hypertension: Secondary | ICD-10-CM | POA: Diagnosis not present

## 2021-03-14 DIAGNOSIS — Z0000 Encounter for general adult medical examination without abnormal findings: Secondary | ICD-10-CM | POA: Diagnosis not present

## 2021-03-14 DIAGNOSIS — G629 Polyneuropathy, unspecified: Secondary | ICD-10-CM | POA: Diagnosis not present

## 2021-03-14 DIAGNOSIS — M109 Gout, unspecified: Secondary | ICD-10-CM | POA: Diagnosis not present

## 2021-03-14 DIAGNOSIS — E1169 Type 2 diabetes mellitus with other specified complication: Secondary | ICD-10-CM | POA: Diagnosis not present

## 2021-03-14 DIAGNOSIS — E119 Type 2 diabetes mellitus without complications: Secondary | ICD-10-CM | POA: Diagnosis not present

## 2021-03-14 DIAGNOSIS — E7800 Pure hypercholesterolemia, unspecified: Secondary | ICD-10-CM | POA: Diagnosis not present

## 2021-03-14 DIAGNOSIS — Z794 Long term (current) use of insulin: Secondary | ICD-10-CM | POA: Diagnosis not present

## 2021-03-14 DIAGNOSIS — Z1159 Encounter for screening for other viral diseases: Secondary | ICD-10-CM | POA: Diagnosis not present

## 2021-04-04 DIAGNOSIS — M79673 Pain in unspecified foot: Secondary | ICD-10-CM | POA: Diagnosis not present

## 2021-04-04 DIAGNOSIS — M7742 Metatarsalgia, left foot: Secondary | ICD-10-CM | POA: Diagnosis not present

## 2021-04-04 DIAGNOSIS — M7741 Metatarsalgia, right foot: Secondary | ICD-10-CM | POA: Diagnosis not present

## 2021-05-07 DIAGNOSIS — G5791 Unspecified mononeuropathy of right lower limb: Secondary | ICD-10-CM | POA: Diagnosis not present

## 2021-05-07 DIAGNOSIS — M7741 Metatarsalgia, right foot: Secondary | ICD-10-CM | POA: Diagnosis not present

## 2021-05-07 DIAGNOSIS — G5792 Unspecified mononeuropathy of left lower limb: Secondary | ICD-10-CM | POA: Diagnosis not present

## 2021-05-07 DIAGNOSIS — M7740 Metatarsalgia, unspecified foot: Secondary | ICD-10-CM | POA: Diagnosis not present

## 2021-05-07 DIAGNOSIS — G5790 Unspecified mononeuropathy of unspecified lower limb: Secondary | ICD-10-CM | POA: Diagnosis not present

## 2021-05-07 DIAGNOSIS — M7742 Metatarsalgia, left foot: Secondary | ICD-10-CM | POA: Diagnosis not present

## 2021-06-19 ENCOUNTER — Encounter: Payer: Self-pay | Admitting: Adult Health

## 2021-06-19 ENCOUNTER — Other Ambulatory Visit: Payer: Self-pay

## 2021-06-19 ENCOUNTER — Ambulatory Visit (INDEPENDENT_AMBULATORY_CARE_PROVIDER_SITE_OTHER): Payer: Medicare HMO | Admitting: Adult Health

## 2021-06-19 ENCOUNTER — Other Ambulatory Visit (HOSPITAL_COMMUNITY)
Admission: RE | Admit: 2021-06-19 | Discharge: 2021-06-19 | Disposition: A | Discharge status: Home / Self Care | Payer: Medicare HMO | Source: Ambulatory Visit | Attending: Adult Health | Admitting: Adult Health

## 2021-06-19 VITALS — BP 169/95 | HR 84 | Ht 63.5 in | Wt 272.5 lb

## 2021-06-19 DIAGNOSIS — N907 Vulvar cyst: Secondary | ICD-10-CM | POA: Insufficient documentation

## 2021-06-19 DIAGNOSIS — Z1151 Encounter for screening for human papillomavirus (HPV): Secondary | ICD-10-CM | POA: Insufficient documentation

## 2021-06-19 DIAGNOSIS — Z780 Asymptomatic menopausal state: Secondary | ICD-10-CM | POA: Diagnosis not present

## 2021-06-19 DIAGNOSIS — Z01419 Encounter for gynecological examination (general) (routine) without abnormal findings: Secondary | ICD-10-CM | POA: Insufficient documentation

## 2021-06-19 DIAGNOSIS — Z1211 Encounter for screening for malignant neoplasm of colon: Secondary | ICD-10-CM | POA: Diagnosis not present

## 2021-06-19 LAB — HEMOCCULT GUIAC POC 1CARD (OFFICE): Fecal Occult Blood, POC: NEGATIVE

## 2021-06-19 NOTE — Progress Notes (Signed)
Patient ID: Nancy Oliver, female   DOB: 1961/01/09, 61 y.o.   MRN: [id number] History of Present Illness: Nancy Oliver is a 61 year old white female,married, PM in for a well woman gyn exam and pap, last pap was 2016. Has spot on labia. PCP is [facility].   Current Medications, Allergies, Past Medical History, Past Surgical History, Family History and Social History were reviewed in [employer] record.     Review of Systems: Patient denies any headaches, hearing loss, fatigue, blurred vision, shortness of breath, chest pain, abdominal pain, problems with bowel movements, urination, or intercourse(not active). No joint pain or mood swings.  Has spot on labia Denies any vaginal bleeding   Physical Exam:BP (!) 169/95 (BP Location: Left Arm, Patient Position: Sitting, Cuff Size: Large)    Pulse 84    Ht 5' 3.5" (1.613 m)    Wt 272 lb 8 oz (123.6 kg)    LMP 10/07/2012    BMI 47.51 kg/m   she did not take BP meds this morning  General:  Well developed, well nourished, no acute distress Skin:  Warm and dry Neck:  Midline trachea, normal thyroid, good ROM, no lymphadenopathy Lungs; Clear to auscultation bilaterally Breast:  No dominant palpable mass, retraction, or nipple discharge Cardiovascular: Regular rate and rhythm,has soft murmur  Abdomen:  Soft, non tender, no hepatosplenomegaly Pelvic:  External genitalia is normal in appearance, has multiple epidermal cysts both labia. The vagina is pale with loss of moisture and rugae. Urethra has no lesions or masses. The cervix is smooth, pap with HR HPV genotyping performed.  Uterus is felt to be normal size, shape, and contour.  No adnexal masses or tenderness noted.Bladder is non tender, no masses felt. Rectal: Good sphincter tone, no polyps, or hemorrhoids felt.  Hemoccult negative. Extremities/musculoskeletal:  No swelling or varicosities noted, no clubbing or cyanosis Psych:  No mood changes, alert and  cooperative,seems happy AA is 0 Fall risk is low Depression screen [facility] 2/9 06/19/2021 09/13/2015  Decreased Interest 3 0  Down, Depressed, Hopeless 3 1  PHQ - 2 Score 6 1  Altered sleeping 3 -  Tired, decreased energy 3 -  Change in appetite 0 -  Feeling bad or failure about yourself  0 -  Trouble concentrating 0 -  Moving slowly or fidgety/restless 0 -  Suicidal thoughts 0 -  PHQ-9 Score 12 -   On Cymbalta, has xanax prn GAD 7 : Generalized Anxiety Score 06/19/2021  Nervous, Anxious, on Edge 1  Control/stop worrying 1  Worry too much - different things 3  Trouble relaxing 3  Restless 0  Easily annoyed or irritable 0  Afraid - awful might happen 0  Total GAD 7 Score 8    [facility] - 06/19/21 1112       Pregnancy Intention Screening   Does the patient want to become pregnant in the next year? N/A    Does the patient's partner want to become pregnant in the next year? N/A    Would the patient like to discuss contraceptive options today? N/A      Contraception Wrap Up   Current Method No Method - Other Reason   postmenopausal   End Method No Method - Other Reason   postmenopausal   Contraception Counseling Provided No              Examination chaperoned by Levy Pupa LPN   Impression and Plan: 1. Encounter for gynecological examination with Papanicolaou smear  of cervix Pap sent Pap in 3 years if normal Physical with PCP next year Mammogram yearly Has colonoscopy consult 08/09/21 in [place] with PCP Go home and take BP meds   2. Encounter for screening fecal occult blood testing   3. Epidermal cyst of vulva Showed her pictures in [facility] Leave alone, unless getting big   4. Postmenopausal

## 2021-06-21 LAB — CYTOLOGY - PAP
Adequacy: ABSENT
Comment: NEGATIVE
Diagnosis: NEGATIVE
High risk HPV: NEGATIVE

## 2021-07-05 DIAGNOSIS — M1711 Unilateral primary osteoarthritis, right knee: Secondary | ICD-10-CM | POA: Diagnosis not present

## 2021-07-05 DIAGNOSIS — M25561 Pain in right knee: Secondary | ICD-10-CM | POA: Diagnosis not present

## 2021-07-15 ENCOUNTER — Telehealth: Payer: Self-pay | Admitting: Adult Health

## 2021-07-15 NOTE — Telephone Encounter (Signed)
Pt states that she has not had a period in 7 years. On Saturday morning she had some spotting, yesterday she had more blood when she woke up. She noticed bleeding when wiping yesterday. None so far today. Would like to speak with Anderson Malta to see what she should do. She is worried because she shouldn't be bleeding.

## 2021-07-15 NOTE — Telephone Encounter (Signed)
Patient called stating that she is having bleeding issues and she should not be bleeding. Patient would like a call back from [place]. Please contact pt

## 2021-07-15 NOTE — Telephone Encounter (Signed)
Nancy Oliver having PMB to come in in am to be checked, will get [place] scheduled too

## 2021-07-16 ENCOUNTER — Ambulatory Visit: Payer: Medicare HMO | Admitting: Adult Health

## 2021-07-16 ENCOUNTER — Encounter: Payer: Self-pay | Admitting: Adult Health

## 2021-07-16 ENCOUNTER — Telehealth: Payer: Self-pay | Admitting: Adult Health

## 2021-07-16 ENCOUNTER — Other Ambulatory Visit: Payer: Self-pay

## 2021-07-16 VITALS — BP 152/92 | HR 83 | Ht 63.5 in | Wt 271.0 lb

## 2021-07-16 DIAGNOSIS — N950 Postmenopausal bleeding: Secondary | ICD-10-CM | POA: Diagnosis not present

## 2021-07-16 MED ORDER — IBUPROFEN 800 MG PO TABS: 800.0000 mg | ORAL_TABLET | Freq: Three times a day (TID) | ORAL | 1 refills | Status: DC | PRN

## 2021-07-16 NOTE — Addendum Note (Signed)
Addended by: Derrek Monaco A on: 07/16/2021 04:52 PM   Modules accepted: Orders

## 2021-07-16 NOTE — Progress Notes (Signed)
°  Subjective:     Patient ID: [name], female   DOB: 1960/06/16, 61 y.o.   MRN: [id number]  HPI Emmer is a 61 year old white female, married, PM, in complaining of have vaginal bleeding, it started out brown on Saturday and then red with clots in Sunday, still has blood, esp when wipes. PCP is [facility].   Lab Results  Component Value Date   DIAGPAP  06/19/2021    - Negative for intraepithelial lesion or malignancy (NILM)   [place] Negative 06/19/2021    Review of Systems +vaginal bleeding, PM Reviewed past medical,surgical, social and family history. Reviewed medications and allergies.     Objective:   Physical Exam BP (!) 152/92 (BP Location: Right Arm, Patient Position: Sitting, Cuff Size: Large)    Pulse 83    Ht 5' 3.5" (1.613 m)    Wt 271 lb (122.9 kg)    LMP 10/07/2012    BMI 47.25 kg/m     Skin warm and dry.Pelvic: external genitalia is normal in appearance no lesions, vagina: +dark blood,urethra has no lesions or masses noted, cervix:smooth and bulbous,blood at os, uterus: normal size, shape and contour, non tender, no masses felt, adnexa: no masses or tenderness noted. Bladder is non tender and no masses felt.  [facility] - 07/16/21 0936       Pregnancy Intention Screening   Does the patient want to become pregnant in the next year? No    Does the patient's partner want to become pregnant in the next year? No    Would the patient like to discuss contraceptive options today? No      Contraception Wrap Up   Current Method --   PM   End Method --   PM   Contraception Counseling Provided No            Examination chaperoned by Celene Squibb LPN  Assessment:      1. PMB (postmenopausal bleeding) Will get pelvic US at [facility] 07/19/21 at 1:30 pm, to assess uterine lining if thickened will need endometrial biopsy to rule out endometrial cancer     Plan:     Will talk when [place] results back

## 2021-07-16 NOTE — Telephone Encounter (Signed)
Patient calling back wanting to know if you will give her something for the cramps states that they are really bad. If so please send to [facility] in [place]

## 2021-07-16 NOTE — Telephone Encounter (Signed)
Rx sent in for motrin 800 mg

## 2021-07-17 NOTE — Telephone Encounter (Signed)
Pt aware that rx sent to pharmacy.

## 2021-07-19 ENCOUNTER — Other Ambulatory Visit: Payer: Self-pay

## 2021-07-19 ENCOUNTER — Ambulatory Visit (HOSPITAL_COMMUNITY)
Admission: RE | Admit: 2021-07-19 | Discharge: 2021-07-19 | Disposition: A | Discharge status: Home / Self Care | Payer: Medicare HMO | Source: Ambulatory Visit | Attending: Adult Health | Admitting: Adult Health

## 2021-07-19 DIAGNOSIS — D251 Intramural leiomyoma of uterus: Secondary | ICD-10-CM | POA: Diagnosis not present

## 2021-07-19 DIAGNOSIS — N888 Other specified noninflammatory disorders of cervix uteri: Secondary | ICD-10-CM | POA: Diagnosis not present

## 2021-07-19 DIAGNOSIS — R9389 Abnormal findings on diagnostic imaging of other specified body structures: Secondary | ICD-10-CM | POA: Diagnosis not present

## 2021-07-19 DIAGNOSIS — N950 Postmenopausal bleeding: Secondary | ICD-10-CM | POA: Diagnosis not present

## 2021-07-22 ENCOUNTER — Telehealth: Payer: Self-pay | Admitting: Adult Health

## 2021-07-22 NOTE — Telephone Encounter (Signed)
Left message to call me about [place]

## 2021-07-22 NOTE — Telephone Encounter (Signed)
Pt aware of [place] and that endometrium thickened will get endometrial biopsy with Dr Nelda Marseille 07/31/21

## 2021-07-31 ENCOUNTER — Ambulatory Visit: Payer: Medicare HMO | Admitting: Obstetrics & Gynecology

## 2021-07-31 ENCOUNTER — Other Ambulatory Visit: Payer: Self-pay

## 2021-07-31 ENCOUNTER — Encounter: Payer: Self-pay | Admitting: Obstetrics & Gynecology

## 2021-07-31 ENCOUNTER — Other Ambulatory Visit (HOSPITAL_COMMUNITY)
Admission: RE | Admit: 2021-07-31 | Discharge: 2021-07-31 | Disposition: A | Discharge status: Home / Self Care | Payer: Medicare HMO | Source: Ambulatory Visit | Attending: Obstetrics & Gynecology | Admitting: Obstetrics & Gynecology

## 2021-07-31 VITALS — BP 142/96 | HR 84 | Ht 65.0 in | Wt 280.6 lb

## 2021-07-31 DIAGNOSIS — R9389 Abnormal findings on diagnostic imaging of other specified body structures: Secondary | ICD-10-CM | POA: Diagnosis not present

## 2021-07-31 DIAGNOSIS — N840 Polyp of corpus uteri: Secondary | ICD-10-CM | POA: Diagnosis not present

## 2021-07-31 DIAGNOSIS — N950 Postmenopausal bleeding: Secondary | ICD-10-CM | POA: Insufficient documentation

## 2021-07-31 NOTE — Progress Notes (Signed)
GYN VISIT Patient name: Nancy Oliver MRN [id number]  Date of birth: [birth year] Chief Complaint:   Procedure (Endo bx)  History of Present Illness:   Nancy Oliver is a 61 y.o. [phone]  female being seen today for the following concerns:.     PMB: Started spotting on Sat early Feb- brown discharge then by Sunday bright red blood.  Only noted when wiped/used the bathroom. Bleeding eventually stopped last about 8-9 days.  Did have to wear a pantyliner.  Did note some cramping, some improvement with ibuprofen.  No other acute complaints  Ultrasound reviewed: 9.5cm anteverted uterus, 17mm endometrium.  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  Normal ovaries. Images were reviewed on my own.  Patient's last menstrual period was 10/07/2012.  Depression screen [facility] 2/9 06/19/2021 09/13/2015  Decreased Interest 3 0  Down, Depressed, Hopeless 3 1  PHQ - 2 Score 6 1  Altered sleeping 3 -  Tired, decreased energy 3 -  Change in appetite 0 -  Feeling bad or failure about yourself  0 -  Trouble concentrating 0 -  Moving slowly or fidgety/restless 0 -  Suicidal thoughts 0 -  PHQ-9 Score 12 -     Review of Systems:   Pertinent items are noted in HPI Denies fever/chills, dizziness, headaches, visual disturbances, fatigue, shortness of breath, chest pain, abdominal pain, vomiting, bowel movements, urination, or intercourse unless otherwise stated above.  Pertinent History Reviewed:  Reviewed past medical,surgical, social, obstetrical and family history.  Reviewed problem list, medications and allergies. Physical Assessment:   Vitals:   07/31/21 0949  BP: (!) 142/96  Pulse: 84  Weight: 280 lb 9.6 oz (127.3 kg)  Height: 5\' 5"  (1.651 m)  Body mass index is 46.69 kg/m.       Physical Examination:   General appearance: alert, well appearing, and in no distress  Psych: mood appropriate, normal affect  Skin: warm & dry   Cardiovascular: normal heart rate noted  Respiratory: normal  respiratory effort, no distress  Abdomen: obese, soft, non-tender   Pelvic: VULVA: normal appearing vulva with no masses, tenderness or lesions, VAGINA: normal appearing vagina with normal color and discharge, no lesions, CERVIX: normal appearing cervix without discharge or lesions  Extremities: no edema   Chaperone: Celene Squibb    Endometrial Biopsy Procedure Note  Pre-operative Diagnosis: Postmenopausal, thickened endometrium  Post-operative Diagnosis: same  Procedure Details  The risks (including infection, bleeding, pain, and uterine perforation) and benefits of the procedure were explained to the patient and Written informed consent was obtained.  Antibiotic prophylaxis against endocarditis was not indicated.   The patient was placed in the dorsal lithotomy position.  Bimanual exam showed the uterus to be in the neutral position.  A speculum inserted in the vagina, and the cervix prepped with betadine.     A single tooth tenaculum was applied to the anterior lip of the cervix for stabilization.  A Pipelle endometrial aspirator was used to sample the endometrium.  Sample was sent for pathologic examination.  Condition: Stable  Complications: None    Assessment & Plan:  1) Postmenopausal bleeding/thickened endometrium -Reviewed causes of bleeding from benign conditions to malignancy -reviewed work up, [place] reviewed- due to thickened lining advised next step -EMB obtained, see above -Next step pending results of pathology.   The patient was advised to call for any fever or for prolonged or severe pain or bleeding. She was advised to use OTC analgesics as needed for mild to moderate pain. She was advised  to avoid vaginal intercourse for 48 hours or until the bleeding has completely stopped.   No orders of the defined types were placed in this encounter.   Return in about 1 year (around 07/31/2022) for Annual.   Janyth Pupa, DO Attending [place],  [facility] for [facility], [place]

## 2021-08-01 LAB — SURGICAL PATHOLOGY

## 2021-08-13 NOTE — Patient Instructions (Signed)
Nancy Oliver  08/13/2021     '@PREFPERIOPPHARMACY'$ @   Your procedure is scheduled on 08/20/2021.   Report to [facility] at 1200  P.M.   Call this number if you have problems the morning of surgery:  [phone]   Remember:  Do not eat or drink after midnight.       Take 1/2 of your night time insulin the night before your procedure.    Take these medicines the morning of surgery with A SIP OF WATER      allopurinol, xanax(if needed), cymbalta, gabapentin, prilosec.     Do not wear jewelry, make-up or nail polish.  Do not wear lotions, powders, or perfumes, or deodorant.  Do not shave 48 hours prior to surgery.  Men may shave face and neck.  Do not bring valuables to the hospital.  [facility] is not responsible for any belongings or valuables.  Contacts, dentures or bridgework may not be worn into surgery.  Leave your suitcase in the car.  After surgery it may be brought to your room.  For patients admitted to the hospital, discharge time will be determined by your treatment team.  Patients discharged the day of surgery will not be allowed to drive home and must have someone with them for for 24 hours.    Special instructions:   DO NOT smoke tobacco or vape for 24 hours before your procedure.  Please read over the following fact sheets that you were given. Coughing and Deep Breathing, Surgical Site Infection Prevention, Anesthesia Post-op Instructions, and Care and Recovery After Surgery      Dilation and Curettage or Vacuum Curettage, Care After The following information offers guidance on how to care for yourself after your procedure. Your doctor may also give you more specific instructions. If you have problems or questions, contact your doctor. What can I expect after the procedure? After the procedure, it is common to have: Mild pain or cramps. Some bleeding or spotting from the vagina. These may last for up to 2 weeks. Follow these  instructions at home: Medicines Take over-the-counter and prescription medicines only as told by your doctor. If told, take steps to prevent problems with pooping (constipation). You may need to: Drink enough fluid to keep your pee (urine) pale yellow. Take medicines. You will be told what medicines to take. Eat foods that are high in fiber. These include beans, whole grains, and fresh fruits and vegetables. Limit foods that are high in fat and sugar. These include fried or sweet foods. Ask your doctor if you should avoid driving or using machines while you are taking your medicine. Activity  If you were given a medicine to help you relax (sedative) during your procedure, it can affect you for many hours. Do not drive or use machinery until your doctor says that it is safe. Rest as told by your doctor. Get up to take short walks every 1-2 hours. Ask for help if you feel weak or unsteady. Do not lift anything that is heavier than 10 lb (4.5 kg), or the limit that you are told. Return to your normal activities when your doctor says that it is safe. Lifestyle For at least 2 weeks, or as long as told by your doctor: Do not douche. Do not use tampons. Do not have sex. General instructions Do not take baths, swim, or use a hot tub. Ask your doctor if you may take showers. Do not smoke  or use any products that contain nicotine or tobacco. These can delay healing. If you need help quitting, ask your doctor. Wear compression stockings as told by your doctor. It is up to you to get the results of your procedure. Ask how to get your results when they are ready. Keep all follow-up visits. Contact a doctor if: You have very bad cramps that get worse or do not get better with medicine. You have very bad pain in your belly (abdomen). You cannot drink fluids without vomiting. You have pain in the area just above your thighs. You have fluid from your vagina that smells bad. You have a rash. Get help  right away if: You are bleeding a lot from your vagina. This means soaking more than one sanitary pad in 1 hour, and this happens for 2 hours in a row. You have a fever that is above 100.42F (38C). Your belly feels very tender or hard. You have chest pain. You have trouble breathing. You feel dizzy or light-headed. You faint. You have pain in your neck or shoulder area. These symptoms may be an emergency. Get help right away. Call your local emergency services (911 in the [place] Do not wait to see if the symptoms will go away. Do not drive yourself to the hospital. Summary After your procedure, it is common to have pain or cramping. It is also common to have bleeding or spotting from your vagina. Rest as told. Get up to take short walks every 1-2 hours. Do not lift anything that is heavier than 10 lb (4.5 kg), or the limit that you are told. Get help right away if you have problems from the procedure. Ask your doctor what problems to watch for. This information is not intended to replace advice given to you by your health care provider. Make sure you discuss any questions you have with your health care provider. Document Revised: 05/16/2020 Document Reviewed: 05/16/2020 Elsevier Patient Education  2022 [place] Anesthesia, Adult, Care After This sheet gives you information about how to care for yourself after your procedure. Your health care provider may also give you more specific instructions. If you have problems or questions, contact your health care provider. What can I expect after the procedure? After the procedure, the following side effects are common: Pain or discomfort at the IV site. Nausea. Vomiting. Sore throat. Trouble concentrating. Feeling cold or chills. Feeling weak or tired. Sleepiness and fatigue. Soreness and body aches. These side effects can affect parts of the body that were not involved in surgery. Follow these instructions at home: For the  time period you were told by your health care provider:  Rest. Do not participate in activities where you could fall or become injured. Do not drive or use machinery. Do not drink alcohol. Do not take sleeping pills or medicines that cause drowsiness. Do not make important decisions or sign legal documents. Do not take care of children on your own. Eating and drinking Follow any instructions from your health care provider about eating or drinking restrictions. When you feel hungry, start by eating small amounts of foods that are soft and easy to digest (bland), such as toast. Gradually return to your regular diet. Drink enough fluid to keep your urine pale yellow. If you vomit, rehydrate by drinking water, juice, or clear broth. General instructions If you have sleep apnea, surgery and certain medicines can increase your risk for breathing problems. Follow instructions from your health care provider about wearing  your sleep device: Anytime you are sleeping, including during daytime naps. While taking prescription pain medicines, sleeping medicines, or medicines that make you drowsy. Have a responsible adult stay with you for the time you are told. It is important to have someone help care for you until you are awake and alert. Return to your normal activities as told by your health care provider. Ask your health care provider what activities are safe for you. Take over-the-counter and prescription medicines only as told by your health care provider. If you smoke, do not smoke without supervision. Keep all follow-up visits as told by your health care provider. This is important. Contact a health care provider if: You have nausea or vomiting that does not get better with medicine. You cannot eat or drink without vomiting. You have pain that does not get better with medicine. You are unable to pass urine. You develop a skin rash. You have a fever. You have redness around your IV site that  gets worse. Get help right away if: You have difficulty breathing. You have chest pain. You have blood in your urine or stool, or you vomit blood. Summary After the procedure, it is common to have a sore throat or nausea. It is also common to feel tired. Have a responsible adult stay with you for the time you are told. It is important to have someone help care for you until you are awake and alert. When you feel hungry, start by eating small amounts of foods that are soft and easy to digest (bland), such as toast. Gradually return to your regular diet. Drink enough fluid to keep your urine pale yellow. Return to your normal activities as told by your health care provider. Ask your health care provider what activities are safe for you. This information is not intended to replace advice given to you by your health care provider. Make sure you discuss any questions you have with your health care provider. Document Revised: 02/09/2020 Document Reviewed: 09/08/2019 Elsevier Patient Education  2022 [place]. How to Use Chlorhexidine for Bathing Chlorhexidine gluconate (CHG) is a germ-killing (antiseptic) solution that is used to clean the skin. It can get rid of the bacteria that normally live on the skin and can keep them away for about 24 hours. To clean your skin with CHG, you may be given: A CHG solution to use in the shower or as part of a sponge bath. A prepackaged cloth that contains CHG. Cleaning your skin with CHG may help lower the risk for infection: While you are staying in the intensive care unit of the hospital. If you have a vascular access, such as a central line, to provide short-term or long-term access to your veins. If you have a catheter to drain urine from your bladder. If you are on a ventilator. A ventilator is a machine that helps you breathe by moving air in and out of your lungs. After surgery. What are the risks? Risks of using CHG include: A skin  reaction. Hearing loss, if CHG gets in your ears and you have a perforated eardrum. Eye injury, if CHG gets in your eyes and is not rinsed out. The CHG product catching fire. Make sure that you avoid smoking and flames after applying CHG to your skin. Do not use CHG: If you have a chlorhexidine allergy or have previously reacted to chlorhexidine. On babies younger than 80 months of age. How to use CHG solution Use CHG only as told by your health  care provider, and follow the instructions on the label. Use the full amount of CHG as directed. Usually, this is one bottle. During a shower Follow these steps when using CHG solution during a shower (unless your health care provider gives you different instructions): Start the shower. Use your normal soap and shampoo to wash your face and hair. Turn off the shower or move out of the shower stream. Pour the CHG onto a clean washcloth. Do not use any type of brush or rough-edged sponge. Starting at your neck, lather your body down to your toes. Make sure you follow these instructions: If you will be having surgery, pay special attention to the part of your body where you will be having surgery. Scrub this area for at least 1 minute. Do not use CHG on your head or face. If the solution gets into your ears or eyes, rinse them well with water. Avoid your genital area. Avoid any areas of skin that have broken skin, cuts, or scrapes. Scrub your back and under your arms. Make sure to wash skin folds. Let the lather sit on your skin for 1-2 minutes or as long as told by your health care provider. Thoroughly rinse your entire body in the shower. Make sure that all body creases and crevices are rinsed well. Dry off with a clean towel. Do not put any substances on your body afterward--such as powder, lotion, or perfume--unless you are told to do so by your health care provider. Only use lotions that are recommended by the manufacturer. Put on clean clothes or  pajamas. If it is the night before your surgery, sleep in clean sheets.  During a sponge bath Follow these steps when using CHG solution during a sponge bath (unless your health care provider gives you different instructions): Use your normal soap and shampoo to wash your face and hair. Pour the CHG onto a clean washcloth. Starting at your neck, lather your body down to your toes. Make sure you follow these instructions: If you will be having surgery, pay special attention to the part of your body where you will be having surgery. Scrub this area for at least 1 minute. Do not use CHG on your head or face. If the solution gets into your ears or eyes, rinse them well with water. Avoid your genital area. Avoid any areas of skin that have broken skin, cuts, or scrapes. Scrub your back and under your arms. Make sure to wash skin folds. Let the lather sit on your skin for 1-2 minutes or as long as told by your health care provider. Using a different clean, wet washcloth, thoroughly rinse your entire body. Make sure that all body creases and crevices are rinsed well. Dry off with a clean towel. Do not put any substances on your body afterward--such as powder, lotion, or perfume--unless you are told to do so by your health care provider. Only use lotions that are recommended by the manufacturer. Put on clean clothes or pajamas. If it is the night before your surgery, sleep in clean sheets. How to use CHG prepackaged cloths Only use CHG cloths as told by your health care provider, and follow the instructions on the label. Use the CHG cloth on clean, dry skin. Do not use the CHG cloth on your head or face unless your health care provider tells you to. When washing with the CHG cloth: Avoid your genital area. Avoid any areas of skin that have broken skin, cuts, or scrapes. Before surgery  Follow these steps when using a CHG cloth to clean before surgery (unless your health care provider gives you  different instructions): Using the CHG cloth, vigorously scrub the part of your body where you will be having surgery. Scrub using a back-and-forth motion for 3 minutes. The area on your body should be completely wet with CHG when you are done scrubbing. Do not rinse. Discard the cloth and let the area air-dry. Do not put any substances on the area afterward, such as powder, lotion, or perfume. Put on clean clothes or pajamas. If it is the night before your surgery, sleep in clean sheets.  For general bathing Follow these steps when using CHG cloths for general bathing (unless your health care provider gives you different instructions). Use a separate CHG cloth for each area of your body. Make sure you wash between any folds of skin and between your fingers and toes. Wash your body in the following order, switching to a new cloth after each step: The front of your neck, shoulders, and chest. Both of your arms, under your arms, and your hands. Your stomach and groin area, avoiding the genitals. Your right leg and foot. Your left leg and foot. The back of your neck, your back, and your buttocks. Do not rinse. Discard the cloth and let the area air-dry. Do not put any substances on your body afterward--such as powder, lotion, or perfume--unless you are told to do so by your health care provider. Only use lotions that are recommended by the manufacturer. Put on clean clothes or pajamas. Contact a health care provider if: Your skin gets irritated after scrubbing. You have questions about using your solution or cloth. You swallow any chlorhexidine. Call your local poison control center (1-2540369554 in the [place] Get help right away if: Your eyes itch badly, or they become very red or swollen. Your skin itches badly and is red or swollen. Your hearing changes. You have trouble seeing. You have swelling or tingling in your mouth or throat. You have trouble breathing. These symptoms may  represent a serious problem that is an emergency. Do not wait to see if the symptoms will go away. Get medical help right away. Call your local emergency services (911 in the [place] Do not drive yourself to the hospital. Summary Chlorhexidine gluconate (CHG) is a germ-killing (antiseptic) solution that is used to clean the skin. Cleaning your skin with CHG may help to lower your risk for infection. You may be given CHG to use for bathing. It may be in a bottle or in a prepackaged cloth to use on your skin. Carefully follow your health care provider's instructions and the instructions on the product label. Do not use CHG if you have a chlorhexidine allergy. Contact your health care provider if your skin gets irritated after scrubbing. This information is not intended to replace advice given to you by your health care provider. Make sure you discuss any questions you have with your health care provider. Document Revised: 08/06/2020 Document Reviewed: 08/06/2020 Elsevier Patient Education  2022 [employer].

## 2021-08-16 ENCOUNTER — Encounter (HOSPITAL_COMMUNITY)
Admission: RE | Admit: 2021-08-16 | Discharge: 2021-08-16 | Disposition: A | Discharge status: Home / Self Care | Payer: Medicare HMO | Source: Ambulatory Visit | Attending: Obstetrics & Gynecology | Admitting: Obstetrics & Gynecology

## 2021-08-16 DIAGNOSIS — E118 Type 2 diabetes mellitus with unspecified complications: Secondary | ICD-10-CM

## 2021-08-18 NOTE — H&P (Incomplete)
[facility] Attending History and Physical  Nancy Oliver is a 61 y.o. [phone] who presents for scheduled hysteroscopy, D&C, myosure polypectomy  In review, pt had an episode of postmenopausal bleeding.  Initially brown discharge that turned to bright red blood that lasted about 8-9 days, using a pantyliner due to the bleeding.  No other acute complaints.  US showed thickened lining and EMB returned with findings suggestive of endometrial polyp  Denies any abnormal vaginal discharge, fevers, chills, sweats, dysuria, nausea, vomiting, other GI or GU symptoms or other general symptoms.  Past Medical History:  Diagnosis Date   Anxiety    Arthritis    Depression    Diabetes mellitus without complication ([place])    Hyperlipidemia    Hypertension    Migraines    Obesity    Right sided sciatica    Past Surgical History:  Procedure Laterality Date   BREAST SURGERY Left 04/2014   biopsy    CESAREAN SECTION     KNEE SURGERY     NASAL SEPTOPLASTY W/ TURBINOPLASTY     OB History  Gravida Para Term Preterm AB Living  3 2 2   1 2   SAB IAB Ectopic Multiple Live Births  1       2    # Outcome Date GA Lbr Len/2nd Weight Sex Delivery Anes PTL Lv  3 Term 12/26/91    M CS-LTranv   LIV  2 Term 03/07/90    M CS-LTranv   LIV  1 SAB 1988          Patient denies any other pertinent gynecologic issues.  No current facility-administered medications on file prior to encounter.   Current Outpatient Medications on File Prior to Encounter  Medication Sig Dispense Refill   allopurinol (ZYLOPRIM) 300 MG tablet Take 300 mg by mouth daily.     ALPRAZolam (XANAX) 0.5 MG tablet TAKE ONE TABLET BY MOUTH EVERY 8 HOURS AS NEEDED 60 tablet 0   aspirin-acetaminophen-caffeine (EXCEDRIN MIGRAINE) 517-001-74 MG per tablet Take 2 tablets by mouth every 6 (six) hours as needed for headache.     atorvastatin (LIPITOR) 80 MG tablet Take 80 mg by mouth daily.     Dulaglutide  (TRULICITY) 1.5 BS/4.9QP SOPN Inject 1.5 mg into the skin every Monday.     DULoxetine (CYMBALTA) 60 MG capsule Take 60 mg by mouth daily.     furosemide (LASIX) 20 MG tablet Take 20 mg by mouth daily.     gabapentin (NEURONTIN) 300 MG capsule Take 300-600 mg by mouth See admin instructions. 300 mg twice daily, 600 mg at bedtime     Glycerin-Hypromellose-PEG 400 (DRY EYE RELIEF DROPS) 0.2-0.2-1 % SOLN Place 1 drop into both eyes daily as needed (dry eyes).     losartan-hydrochlorothiazide (HYZAAR) 100-12.5 MG tablet Take 1 tablet by mouth daily.     metFORMIN (GLUCOPHAGE) 1000 MG tablet Take 1,000 mg by mouth 2 (two) times daily with a meal.     omeprazole (PRILOSEC) 40 MG capsule Take 40 mg by mouth daily.     TRESIBA FLEXTOUCH 100 UNIT/ML SOPN FlexTouch Pen Inject 25 Units into the skin daily.     ACCU-CHEK AVIVA PLUS test strip      Blood Glucose Monitoring Suppl (ACCU-CHEK AVIVA PLUS) w/Device KIT See admin instructions.     ibuprofen (ADVIL) 800 MG tablet Take 1 tablet (800 mg total) by mouth every 8 (eight) hours as needed. Use for cramping (Patient not taking: Reported on  08/16/2021) 60 tablet 1   No Known Allergies  Social History:   reports that she has never smoked. She has never used smokeless tobacco. She reports that she does not drink alcohol and does not use drugs. Family History  Problem Relation Age of Onset   COPD Father    Muscular dystrophy Sister    Lupus Son     Review of Systems: Pertinent items noted in HPI and remainder of comprehensive ROS otherwise negative.  PHYSICAL EXAM: Last menstrual period 10/07/2012. *** CONSTITUTIONAL: Well-developed, well-nourished female in no acute distress.  SKIN: Skin is warm and dry. No rash noted. Not diaphoretic. No erythema. No pallor. NEUROLOGIC: Alert and oriented to person, place, and time. Normal reflexes, muscle tone coordination. No cranial nerve deficit noted. PSYCHIATRIC: Normal mood and affect. Normal behavior. Normal  judgment and thought content. CARDIOVASCULAR: Normal heart rate noted, regular rhythm RESPIRATORY: Effort and breath sounds normal, no problems with respiration noted ABDOMEN: Soft, nontender, nondistended. PELVIC: deferred MUSCULOSKELETAL: no calf tenderness bilaterally EXT: no edema bilaterally, normal pulses  Labs: No results found for this or any previous visit (from the past 336 hour(s)).  Imaging Studies: US PELVIC COMPLETE WITH TRANSVAGINAL  Result Date: 07/19/2021 CLINICAL DATA:  Postmenopausal bleeding EXAM: TRANSABDOMINAL AND TRANSVAGINAL ULTRASOUND OF PELVIS TECHNIQUE: Both transabdominal and transvaginal ultrasound examinations of the pelvis were performed. Transabdominal technique was performed for global imaging of the pelvis including uterus, ovaries, adnexal regions, and pelvic cul-de-sac. It was necessary to proceed with endovaginal exam following the transabdominal exam to visualize the endometrium. COMPARISON:  None FINDINGS: Uterus Measurements: 9.5 x 4.1 x 4.5 cm = volume: 90 mL. Anteverted. Heterogeneous myometrium. Numerous nabothian cysts at cervix. Intramural leiomyoma at uterine fundus anteriorly 12 x 13 x 16 mm. No additional discrete mass. Anterior wall Caesarean section scar. Endometrium Thickness: 7 mm.  No endometrial fluid or mass Right ovary Measurements: 3.2 x 1.8 x 2.3 cm = volume: 6.7 mL. Seen only on transabdominal imaging, obscured by bowel on transvaginal imaging. No gross mass. Left ovary Measurements: 3.7 x 2.2 x 2.1 cm = volume: 9.0 mL. Seen only on transabdominal imaging, obscured by bowel on transvaginal imaging. No gross mass. Other findings No free pelvic fluid or adnexal masses. IMPRESSION: Thickened endometrial complex 7 mm thick; In the setting of post-menopausal bleeding, endometrial sampling is indicated to exclude carcinoma. If results are benign, sonohysterogram should be considered for focal lesion work-up. (Ref: Radiological Reasoning: Algorithmic  Workup of Abnormal Vaginal Bleeding with Endovaginal Sonography and Sonohysterography. AJR 2008; 350:K93-81) Intramural leiomyoma anterior upper uterus 16 mm diameter. Electronically Signed   By: Lavonia Dana M.D.   On: 07/19/2021 15:03    Assessment: Postmenopausal bleeding Uterine polyp  Plan: Hysteroscopy, D&C, myosure polypectomy -NPO -LR @ 125cc/hr -SCDs to OR -Risk/benefits and alternatives reviewed with the patient including but not limited to risk of bleeding, infection and injury to surrounding organs due to uterine perforation. Questions and concerns were addressed and pt desires to proceed  Janyth Pupa, DO Attending [facility] for [facility], [place]

## 2021-08-19 ENCOUNTER — Encounter (HOSPITAL_COMMUNITY)
Admission: RE | Admit: 2021-08-19 | Discharge: 2021-08-19 | Disposition: A | Discharge status: Home / Self Care | Payer: Medicare HMO | Source: Ambulatory Visit | Attending: Obstetrics & Gynecology | Admitting: Obstetrics & Gynecology

## 2021-08-19 ENCOUNTER — Encounter (HOSPITAL_COMMUNITY): Payer: Self-pay

## 2021-08-19 DIAGNOSIS — Z01812 Encounter for preprocedural laboratory examination: Secondary | ICD-10-CM | POA: Insufficient documentation

## 2021-08-19 DIAGNOSIS — N950 Postmenopausal bleeding: Secondary | ICD-10-CM | POA: Diagnosis not present

## 2021-08-19 DIAGNOSIS — E118 Type 2 diabetes mellitus with unspecified complications: Secondary | ICD-10-CM | POA: Diagnosis not present

## 2021-08-19 DIAGNOSIS — Z01818 Encounter for other preprocedural examination: Secondary | ICD-10-CM

## 2021-08-19 HISTORY — DX: Polyneuropathy, unspecified: G62.9

## 2021-08-19 HISTORY — DX: Cardiac murmur, unspecified: R01.1

## 2021-08-19 LAB — CBC
HCT: 42.7 % (ref 36.0–46.0)
Hemoglobin: 13.7 g/dL (ref 12.0–15.0)
MCH: 29.3 pg (ref 26.0–34.0)
MCHC: 32.1 g/dL (ref 30.0–36.0)
MCV: 91.2 fL (ref 80.0–100.0)
Platelets: 251 10*3/uL (ref 150–400)
RBC: 4.68 MIL/uL (ref 3.87–5.11)
RDW: 13.9 % (ref 11.5–15.5)
WBC: 9.4 10*3/uL (ref 4.0–10.5)
nRBC: 0 % (ref 0.0–0.2)

## 2021-08-19 LAB — BASIC METABOLIC PANEL
Anion gap: 11 (ref 5–15)
BUN: 16 mg/dL (ref 6–20)
CO2: 22 mmol/L (ref 22–32)
Calcium: 9 mg/dL (ref 8.9–10.3)
Chloride: 105 mmol/L (ref 98–111)
Creatinine, Ser: 0.68 mg/dL (ref 0.44–1.00)
GFR, Estimated: >60 mL/min (ref >60)
Glucose, Bld: 141 mg/dL — ABNORMAL HIGH (ref 70–99)
Potassium: 4.1 mmol/L (ref 3.5–5.1)
Sodium: 138 mmol/L (ref 135–145)

## 2021-08-20 ENCOUNTER — Other Ambulatory Visit: Payer: Self-pay

## 2021-08-20 ENCOUNTER — Ambulatory Visit (HOSPITAL_COMMUNITY)
Admission: RE | Admit: 2021-08-20 | Discharge: 2021-08-20 | Disposition: A | Discharge status: Home / Self Care | Payer: Medicare HMO | Attending: Obstetrics & Gynecology | Admitting: Obstetrics & Gynecology

## 2021-08-20 ENCOUNTER — Ambulatory Visit (HOSPITAL_COMMUNITY): Payer: Medicare HMO | Admitting: Certified Registered Nurse Anesthetist

## 2021-08-20 ENCOUNTER — Encounter (HOSPITAL_COMMUNITY): Payer: Self-pay | Admitting: Obstetrics & Gynecology

## 2021-08-20 ENCOUNTER — Ambulatory Visit (HOSPITAL_BASED_OUTPATIENT_CLINIC_OR_DEPARTMENT_OTHER): Payer: Medicare HMO | Admitting: Certified Registered Nurse Anesthetist

## 2021-08-20 ENCOUNTER — Encounter (HOSPITAL_COMMUNITY)
Admission: RE | Disposition: A | Discharge status: Home / Self Care | Payer: Self-pay | Source: Home / Self Care | Attending: Obstetrics & Gynecology

## 2021-08-20 DIAGNOSIS — N950 Postmenopausal bleeding: Secondary | ICD-10-CM | POA: Insufficient documentation

## 2021-08-20 DIAGNOSIS — N840 Polyp of corpus uteri: Secondary | ICD-10-CM | POA: Diagnosis not present

## 2021-08-20 DIAGNOSIS — E1165 Type 2 diabetes mellitus with hyperglycemia: Secondary | ICD-10-CM | POA: Diagnosis not present

## 2021-08-20 DIAGNOSIS — I10 Essential (primary) hypertension: Secondary | ICD-10-CM | POA: Diagnosis not present

## 2021-08-20 DIAGNOSIS — R519 Headache, unspecified: Secondary | ICD-10-CM | POA: Diagnosis not present

## 2021-08-20 DIAGNOSIS — F418 Other specified anxiety disorders: Secondary | ICD-10-CM | POA: Diagnosis not present

## 2021-08-20 DIAGNOSIS — Z01818 Encounter for other preprocedural examination: Secondary | ICD-10-CM

## 2021-08-20 HISTORY — PX: POLYPECTOMY: SHX5525

## 2021-08-20 HISTORY — PX: HYSTEROSCOPY WITH D & C: SHX1775

## 2021-08-20 LAB — GLUCOSE, CAPILLARY
Glucose-Capillary: 116 mg/dL — ABNORMAL HIGH (ref 70–99)
Glucose-Capillary: 118 mg/dL — ABNORMAL HIGH (ref 70–99)

## 2021-08-20 LAB — HEMOGLOBIN A1C
Hgb A1c MFr Bld: 7.3 % — ABNORMAL HIGH (ref 4.8–5.6)
Mean Plasma Glucose: 163 mg/dL

## 2021-08-20 SURGERY — DILATATION AND CURETTAGE /HYSTEROSCOPY
Anesthesia: General | Site: Vagina

## 2021-08-20 MED ORDER — ROCURONIUM BROMIDE 10 MG/ML (PF) SYRINGE
PREFILLED_SYRINGE | INTRAVENOUS | Status: AC
  Filled 2021-08-20: qty 10

## 2021-08-20 MED ORDER — MIDAZOLAM HCL 5 MG/5ML IJ SOLN
INTRAMUSCULAR | Status: DC | PRN
  Administered 2021-08-20: 1 mg via INTRAVENOUS

## 2021-08-20 MED ORDER — DEXAMETHASONE SODIUM PHOSPHATE 10 MG/ML IJ SOLN
INTRAMUSCULAR | Status: DC | PRN
  Administered 2021-08-20: 10 mg via INTRAVENOUS

## 2021-08-20 MED ORDER — KETOROLAC TROMETHAMINE 30 MG/ML IJ SOLN
INTRAMUSCULAR | Status: DC | PRN
  Administered 2021-08-20: 30 mg via INTRAVENOUS

## 2021-08-20 MED ORDER — MIDAZOLAM HCL 2 MG/2ML IJ SOLN
INTRAMUSCULAR | Status: AC
  Filled 2021-08-20: qty 2

## 2021-08-20 MED ORDER — LIDOCAINE HCL (PF) 2 % IJ SOLN
INTRAMUSCULAR | Status: AC
  Filled 2021-08-20: qty 5

## 2021-08-20 MED ORDER — CHLORHEXIDINE GLUCONATE 0.12 % MT SOLN: 15.0000 mL | Freq: Once | OROMUCOSAL | Status: DC

## 2021-08-20 MED ORDER — FENTANYL CITRATE PF 50 MCG/ML IJ SOSY: 25.0000 ug | PREFILLED_SYRINGE | INTRAMUSCULAR | Status: DC | PRN

## 2021-08-20 MED ORDER — PHENYLEPHRINE 40 MCG/ML (10ML) SYRINGE FOR IV PUSH (FOR BLOOD PRESSURE SUPPORT)
PREFILLED_SYRINGE | INTRAVENOUS | Status: AC
  Filled 2021-08-20: qty 10

## 2021-08-20 MED ORDER — SUCCINYLCHOLINE CHLORIDE 200 MG/10ML IV SOSY
PREFILLED_SYRINGE | INTRAVENOUS | Status: DC | PRN
  Administered 2021-08-20: 120 mg via INTRAVENOUS

## 2021-08-20 MED ORDER — SODIUM CHLORIDE 0.9 % IR SOLN
Status: DC | PRN
  Administered 2021-08-20: 1000 mL

## 2021-08-20 MED ORDER — KETOROLAC TROMETHAMINE 30 MG/ML IJ SOLN
INTRAMUSCULAR | Status: AC
  Filled 2021-08-20: qty 1

## 2021-08-20 MED ORDER — SUCCINYLCHOLINE CHLORIDE 200 MG/10ML IV SOSY
PREFILLED_SYRINGE | INTRAVENOUS | Status: AC
  Filled 2021-08-20: qty 10

## 2021-08-20 MED ORDER — FENTANYL CITRATE (PF) 250 MCG/5ML IJ SOLN
INTRAMUSCULAR | Status: DC | PRN
  Administered 2021-08-20 (×4): 25 ug via INTRAVENOUS

## 2021-08-20 MED ORDER — ONDANSETRON HCL 4 MG/2ML IJ SOLN
INTRAMUSCULAR | Status: AC
  Filled 2021-08-20: qty 2

## 2021-08-20 MED ORDER — LIDOCAINE-EPINEPHRINE 0.5 %-1:200000 IJ SOLN
INTRAMUSCULAR | Status: AC
  Filled 2021-08-20: qty 1

## 2021-08-20 MED ORDER — LACTATED RINGERS IV SOLN: INTRAVENOUS | Status: DC

## 2021-08-20 MED ORDER — PROPOFOL 10 MG/ML IV BOLUS
INTRAVENOUS | Status: AC
  Filled 2021-08-20: qty 20

## 2021-08-20 MED ORDER — PROPOFOL 10 MG/ML IV BOLUS
INTRAVENOUS | Status: DC | PRN
  Administered 2021-08-20: 150 mg via INTRAVENOUS

## 2021-08-20 MED ORDER — PHENYLEPHRINE HCL (PRESSORS) 10 MG/ML IV SOLN
INTRAVENOUS | Status: DC | PRN
  Administered 2021-08-20 (×2): 80 ug via INTRAVENOUS
  Administered 2021-08-20 (×2): 40 ug via INTRAVENOUS

## 2021-08-20 MED ORDER — ONDANSETRON HCL 4 MG/2ML IJ SOLN
INTRAMUSCULAR | Status: DC | PRN
  Administered 2021-08-20: 4 mg via INTRAVENOUS

## 2021-08-20 MED ORDER — SUCCINYLCHOLINE CHLORIDE 200 MG/10ML IV SOSY
PREFILLED_SYRINGE | INTRAVENOUS | Status: AC
  Filled 2021-08-20: qty 20

## 2021-08-20 MED ORDER — ONDANSETRON HCL 4 MG/2ML IJ SOLN: 4.0000 mg | Freq: Once | INTRAMUSCULAR | Status: DC | PRN

## 2021-08-20 MED ORDER — LIDOCAINE 2% (20 MG/ML) 5 ML SYRINGE
INTRAMUSCULAR | Status: DC | PRN
  Administered 2021-08-20: 80 mg via INTRAVENOUS

## 2021-08-20 MED ORDER — SEVOFLURANE IN SOLN
RESPIRATORY_TRACT | Status: AC
  Filled 2021-08-20: qty 250

## 2021-08-20 MED ORDER — LIDOCAINE-EPINEPHRINE 0.5 %-1:200000 IJ SOLN
INTRAMUSCULAR | Status: DC | PRN
  Administered 2021-08-20: 20 mL

## 2021-08-20 MED ORDER — FENTANYL CITRATE (PF) 100 MCG/2ML IJ SOLN
INTRAMUSCULAR | Status: AC
  Filled 2021-08-20: qty 2

## 2021-08-20 MED ORDER — ORAL CARE MOUTH RINSE: 15.0000 mL | Freq: Once | OROMUCOSAL | Status: DC

## 2021-08-20 MED ORDER — EPHEDRINE 5 MG/ML INJ
INTRAVENOUS | Status: AC
  Filled 2021-08-20: qty 5

## 2021-08-20 MED ORDER — DEXAMETHASONE SODIUM PHOSPHATE 10 MG/ML IJ SOLN
INTRAMUSCULAR | Status: AC
  Filled 2021-08-20: qty 1

## 2021-08-20 SURGICAL SUPPLY — 26 items
CATH ROBINSON RED A/P 16FR (CATHETERS) ×1 IMPLANT
CLOTH BEACON ORANGE TIMEOUT ST (SAFETY) ×3 IMPLANT
COVER LIGHT HANDLE STERIS (MISCELLANEOUS) ×4 IMPLANT
DEVICE MYOSURE REACH (MISCELLANEOUS) ×1 IMPLANT
GAUZE 4X4 16PLY ~~LOC~~+RFID DBL (SPONGE) ×6 IMPLANT
GLOVE SURG LTX SZ6.5 (GLOVE) ×3 IMPLANT
GLOVE SURG POLYISO LF SZ7 (GLOVE) ×1 IMPLANT
GLOVE SURG UNDER POLY LF SZ7 (GLOVE) ×9 IMPLANT
GOWN STRL REUS W/ TWL LRG LVL3 (GOWN DISPOSABLE) ×2 IMPLANT
GOWN STRL REUS W/TWL LRG LVL3 (GOWN DISPOSABLE) ×6 IMPLANT
IV NS IRRIG 3000ML ARTHROMATIC (IV SOLUTION) ×3 IMPLANT
KIT PROCEDURE FLUENT (KITS) ×3 IMPLANT
KIT TURNOVER CYSTO (KITS) ×3 IMPLANT
KIT TURNOVER KIT A (KITS) ×3 IMPLANT
NDL HYPO 18GX1.5 BLUNT FILL (NEEDLE) ×2 IMPLANT
NEEDLE HYPO 18GX1.5 BLUNT FILL (NEEDLE) ×2 IMPLANT
NS IRRIG 1000ML POUR BTL (IV SOLUTION) ×3 IMPLANT
PACK PERI GYN (CUSTOM PROCEDURE TRAY) ×3 IMPLANT
PAD ARMBOARD 7.5X6 YLW CONV (MISCELLANEOUS) ×6 IMPLANT
PAD TELFA 3X4 1S STER (GAUZE/BANDAGES/DRESSINGS) ×3 IMPLANT
SEAL ROD LENS SCOPE MYOSURE (ABLATOR) ×3 IMPLANT
SET BASIN LINEN APH (SET/KITS/TRAYS/PACK) ×3 IMPLANT
SOL PREP POV-IOD 4OZ 10% (MISCELLANEOUS) ×3 IMPLANT
SYR 30ML LL (SYRINGE) ×3 IMPLANT
SYR CONTROL 10ML LL (SYRINGE) ×3 IMPLANT
UNDERPAD 30X36 HEAVY ABSORB (UNDERPADS AND DIAPERS) ×3 IMPLANT

## 2021-08-20 NOTE — Op Note (Signed)
Operative Report ? ?PreOp: 1) Postmenopausal bleeding 2) Uterine polyp ?PostOp: same ?Procedure:  Hysteroscopy, Dilation and Curettage, Uterine polypectomy ?Surgeon: Dr. Janyth Pupa ?Anesthesia: General ?Complications:none ?EBL:  71m ? ?Findings:9cm anteverted uterus with thickened endometrium, ~ 1cm polyp noted.  Both ostia seen ? ?Specimens: 1) uterine polyp 2) Endometrial curettings ? ? ?Procedure: The patient was taken to the operating room where she underwent general anesthesia without difficulty. The patient was placed in a low lithotomy position using Allen stirrups. The patient was examined with the findings as noted above.  She was then prepped and draped in the normal sterile fashion. The bladder was drained using a red rubber urethral catheter. A sterile speculum was inserted into the vagina. Cervical block was completed using 1% lidocaine with epinephrine.   A single tooth tenaculum was placed on the anterior lip of the cervix.  The uterus was then sounded to 9. The endocervical canal was then serially dilated to 17French using Hank dilators.  The diagnostic hysteroscope was then inserted without difficulty and noted to have the findings as listed above. Visualization was achieved using NS as a distending medium.  Myosure was inserted and sharp resection of the polyp was completed.  The hysteroscope was removed and sharp curettage was performed.  All specimens were sent to pathology. The hysteroscope was reinserted and no uterine perforation was seen. All instrument were then removed. Hemostasis was observed at the cervical site.  ?The patient was repositioned to the supine position. The patient tolerated the procedure without any complications and taken to recovery in stable condition.  ? ?JJanyth Pupa DO ?[facility] ?[facility]? ? ? ? ?  ?

## 2021-08-20 NOTE — Anesthesia Preprocedure Evaluation (Signed)
Anesthesia Evaluation  ?Patient identified by MRN, date of birth, ID band ?Patient awake ? ? ? ?Reviewed: ?Allergy & Precautions, H&P , NPO status , Patient's Chart, lab work & pertinent test results, reviewed documented beta blocker date and time  ? ?Airway ?Mallampati: II ? ?TM Distance: >3 FB ?Neck ROM: full ? ? ? Dental ?no notable dental hx. ? ?  ?Pulmonary ?neg pulmonary ROS,  ?  ?Pulmonary exam normal ?breath sounds clear to auscultation ? ? ? ? ? ? Cardiovascular ?Exercise Tolerance: Good ?hypertension, negative cardio ROS ? ? ?Rhythm:regular Rate:Normal ? ? ?  ?Neuro/Psych ? Headaches, PSYCHIATRIC DISORDERS Anxiety Depression  Neuromuscular disease   ? GI/Hepatic ?negative GI ROS, Neg liver ROS,   ?Endo/Other  ?diabetes, Poorly Controlled, Type 2Morbid obesity ? Renal/GU ?negative Renal ROS  ?negative genitourinary ?  ?Musculoskeletal ? ? Abdominal ?  ?Peds ? Hematology ?negative hematology ROS ?(+)   ?Anesthesia Other Findings ? ? Reproductive/Obstetrics ?negative OB ROS ? ?  ? ? ? ? ? ? ? ? ? ? ? ? ? ?  ?  ? ? ? ? ? ? ? ? ?Anesthesia Physical ?Anesthesia Plan ? ?ASA: 3 ? ?Anesthesia Plan: General and General ETT  ? ?Post-op Pain Management:   ? ?Induction:  ? ?PONV Risk Score and Plan: Ondansetron ? ?Airway Management Planned:  ? ?Additional Equipment:  ? ?Intra-op Plan:  ? ?Post-operative Plan:  ? ?Informed Consent: I have reviewed the patients History and Physical, chart, labs and discussed the procedure including the risks, benefits and alternatives for the proposed anesthesia with the patient or authorized representative who has indicated his/her understanding and acceptance.  ? ? ? ?Dental Advisory Given ? ?Plan Discussed with: CRNA ? ?Anesthesia Plan Comments:   ? ? ? ? ? ? ?Anesthesia Quick Evaluation ? ?

## 2021-08-20 NOTE — Anesthesia Procedure Notes (Signed)
Procedure Name: Intubation ?Date/Time: 08/20/2021 12:32 PM ?Performed by: Maude Leriche, CRNA ?Pre-anesthesia Checklist: Patient identified, Emergency Drugs available, Suction available and Patient being monitored ?Patient Re-evaluated:Patient Re-evaluated prior to induction ?Oxygen Delivery Method: Circle system utilized ?Preoxygenation: Pre-oxygenation with 100% oxygen ?Induction Type: IV induction ?Ventilation: Mask ventilation without difficulty ?Laryngoscope Size: Sabra Heck and 2 ?Grade View: Grade I ?Tube type: Oral ?Tube size: 7.0 mm ?Number of attempts: 1 ?Airway Equipment and Method: Bite block ?Placement Confirmation: ETT inserted through vocal cords under direct vision, positive ETCO2 and breath sounds checked- equal and bilateral ?Tube secured with: Tape ?Dental Injury: Teeth and Oropharynx as per pre-operative assessment  ? ? ? ? ?

## 2021-08-20 NOTE — Discharge Instructions (Signed)
HOME INSTRUCTIONS ? ?Please note any unusual or excessive bleeding, pain, swelling. Mild dizziness or drowsiness are normal for about 24 hours after surgery. ?  ?Shower when comfortable ? ?Restrictions: No driving for 24 hours or while taking pain medications. ? ?Activity:  No heavy lifting (> 10 lbs), nothing in vagina (no tampons, douching, or intercourse) x 2 weeks; no tub baths for 2 weeks ?Vaginal spotting is expected but if your bleeding is heavy, period like,  please call the office ?  ?Diet:  You may return to your regular diet.  Do not eat large meals.  Eat small frequent meals throughout the day.  Continue to drink a good amount of water at least 6-8 glasses of water per day, hydration is very important for the healing process. ? ?Pain Management: Take over the counter tylenol and/or ibuprofen as needed for pain.  You may also use a heating pack. ? ?Alcohol -- Avoid for 24 hours and while taking pain medications. ? ?Nausea: Take sips of ginger ale or soda ? ?Fever -- Call physician if temperature over 101 degrees ? ?Follow up: If you experience fever (a temperature greater than 100.4), pain unrelieved by pain medication, shortness of breath, swelling of a single leg, or any other symptoms which are concerning to you please the office immediately.   please call the office at [phone] if you have any problems or concerns. ?

## 2021-08-20 NOTE — Transfer of Care (Signed)
Immediate Anesthesia Transfer of Care Note ? ?Patient: Nancy Oliver ? ?Procedure(s) Performed: DILATATION AND CURETTAGE /HYSTEROSCOPY (Vagina ) ?POLYPECTOMY(WITH MYOSURE DEVICE) (Vagina ) ? ?Patient Location: PACU ? ?Anesthesia Type:General ? ?Level of Consciousness: awake, alert  and oriented ? ?Airway & Oxygen Therapy: Patient Spontanous Breathing and Patient connected to nasal cannula oxygen ? ?Post-op Assessment: Report given to RN, Post -op Vital signs reviewed and stable, Patient moving all extremities X 4 and Patient able to stick tongue midline ? ?Post vital signs: Reviewed ? ?Last Vitals:  ?Vitals Value Taken Time  ?BP 139/80 08/20/21 1305  ?Temp 98.0   ?Pulse 91 08/20/21 1306  ?Resp 15 08/20/21 1306  ?SpO2 93 % 08/20/21 1306  ?Vitals shown include unvalidated device data. ? ?Last Pain:  ?Vitals:  ? 08/20/21 1158  ?TempSrc: Oral  ?   ? ?Patients Stated Pain Goal: 7 (08/20/21 1158) ? ?Complications: No notable events documented. ?

## 2021-08-21 ENCOUNTER — Other Ambulatory Visit: Payer: Self-pay | Admitting: Obstetrics & Gynecology

## 2021-08-21 ENCOUNTER — Encounter (HOSPITAL_COMMUNITY): Payer: Self-pay | Admitting: Obstetrics & Gynecology

## 2021-08-21 LAB — SURGICAL PATHOLOGY

## 2021-08-21 MED ORDER — NORETHINDRONE 0.35 MG PO TABS: 1.0000 | ORAL_TABLET | Freq: Every day | ORAL | 4 refills | Status: DC

## 2021-08-21 NOTE — Anesthesia Postprocedure Evaluation (Signed)
Anesthesia Post Note ? ?Patient: Nancy Oliver ? ?Procedure(s) Performed: DILATATION AND CURETTAGE /HYSTEROSCOPY (Vagina ) ?POLYPECTOMY(WITH MYOSURE DEVICE) (Vagina ) ? ?Patient location during evaluation: Phase II ?Anesthesia Type: General ?Level of consciousness: awake ?Pain management: pain level controlled ?Vital Signs Assessment: post-procedure vital signs reviewed and stable ?Respiratory status: spontaneous breathing and respiratory function stable ?Cardiovascular status: blood pressure returned to baseline and stable ?Postop Assessment: no headache and no apparent nausea or vomiting ?Anesthetic complications: no ?Comments: Late entry ? ? ?No notable events documented. ? ? ?Last Vitals:  ?Vitals:  ? 08/20/21 1345 08/20/21 1354  ?BP: 138/84 (!) 151/96  ?Pulse: 94 95  ?Resp: 20 20  ?Temp:  36.8 ?C  ?SpO2: 97% 96%  ?  ?Last Pain:  ?Vitals:  ? 08/20/21 1354  ?TempSrc: Oral  ?PainSc: 0-No pain  ? ? ?  ?  ?  ?  ?  ?  ? ?Louann Sjogren ? ? ? ? ?

## 2021-08-21 NOTE — Progress Notes (Signed)
Rx sent in for progesterone only pills ? ?Lamar, D&C with proliferative endometrium ?

## 2021-08-27 ENCOUNTER — Other Ambulatory Visit: Payer: Self-pay | Admitting: Physician Assistant

## 2021-08-27 DIAGNOSIS — Z1231 Encounter for screening mammogram for malignant neoplasm of breast: Secondary | ICD-10-CM

## 2021-08-29 NOTE — Progress Notes (Signed)
Has been  ?  ?Cardiology Office Note ? ? ?Date:  08/30/2021  ? ?ID:  Nancy Oliver, DOB [birth year], MRN [id number] ? ?PCP:  Lennie Odor, PA  ?Cardiologist:   Minus Breeding, MD ? ? ?Chief Complaint  ?Patient presents with  ? Chest Pain  ?  Left arm tingling also ongoing for few weeks ?  ? ? ?  ?History of Present Illness: ?Nancy Oliver is a 61 y.o. female who presents for follow up of murmur.  She also was found apparently to have some ectopy though I do not have an EKG.  She had an echo in March 2020 with an EF of 60 - 65% with some mild aortic stenosis with a tricuspid valve but with some calcification.  In March 2022 she had SOB with a negative POET (Plain Old Exercise Treadmill). ? ?She returns for follow up.  Since I last saw her she has lost some weight.  Her peak weight was 313 and she is down to 277.  She says that walking the 50 yards to the dogs he is less bothersome now.  She is having less shortness of breath.  She is not having any resting shortness of breath, PND or orthopnea.  She is having fewer palpitations, presyncope or syncope.  She is not having any chest pressure, neck or arm discomfort.  She does describe some left arm tingling sleeping in a chair which may have exacerbated this.   ? ?Past Medical History:  ?Diagnosis Date  ? Anxiety   ? Arthritis   ? Depression   ? Diabetes mellitus without complication ([place])   ? Heart murmur   ? Hyperlipidemia   ? Hypertension   ? Migraines   ? Neuropathy   ? Obesity   ? Right sided sciatica   ? ? ?Past Surgical History:  ?Procedure Laterality Date  ? BREAST SURGERY Left 04/2014  ? biopsy   ? CESAREAN SECTION    ? HYSTEROSCOPY WITH D & C N/A 08/20/2021  ? Procedure: DILATATION AND CURETTAGE /HYSTEROSCOPY;  Surgeon: Janyth Pupa, DO;  Location: [facility];  Service: Gynecology;  Laterality: N/A;  ? KNEE SURGERY    ? NASAL SEPTOPLASTY W/ TURBINOPLASTY    ? POLYPECTOMY N/A 08/20/2021  ? Procedure: POLYPECTOMY(WITH MYOSURE DEVICE);  Surgeon: Janyth Pupa, DO;  Location: [facility];  Service: Gynecology;  Laterality: N/A;  ? ? ? ?Current Outpatient Medications  ?Medication Sig Dispense Refill  ? ACCU-CHEK AVIVA PLUS test strip     ? allopurinol (ZYLOPRIM) 300 MG tablet Take 300 mg by mouth daily.    ? ALPRAZolam (XANAX) 0.5 MG tablet TAKE ONE TABLET BY MOUTH EVERY 8 HOURS AS NEEDED 60 tablet 0  ? aspirin-acetaminophen-caffeine (EXCEDRIN MIGRAINE) 270-350-09 MG per tablet Take 2 tablets by mouth every 6 (six) hours as needed for headache.    ? atorvastatin (LIPITOR) 80 MG tablet Take 80 mg by mouth daily.    ? Blood Glucose Monitoring Suppl (ACCU-CHEK AVIVA PLUS) w/Device KIT See admin instructions.    ? Dulaglutide (TRULICITY) 1.5 FG/1.8EX SOPN Inject 1.5 mg into the skin every Monday.    ? DULoxetine (CYMBALTA) 60 MG capsule Take 60 mg by mouth daily.    ? furosemide (LASIX) 20 MG tablet Take 20 mg by mouth daily.    ? gabapentin (NEURONTIN) 300 MG capsule Take 300-600 mg by mouth See admin instructions. 300 mg twice daily, 600 mg at bedtime    ? Glycerin-Hypromellose-PEG 400 (DRY EYE RELIEF DROPS) 0.2-0.2-1 %  SOLN Place 1 drop into both eyes daily as needed (dry eyes).    ? losartan-hydrochlorothiazide (HYZAAR) 100-12.5 MG tablet Take 1 tablet by mouth daily.    ? metFORMIN (GLUCOPHAGE) 1000 MG tablet Take 1,000 mg by mouth 2 (two) times daily with a meal.    ? norethindrone (ORTHO MICRONOR) 0.35 MG tablet Take 1 tablet (0.35 mg total) by mouth daily. 90 tablet 4  ? omeprazole (PRILOSEC) 40 MG capsule Take 40 mg by mouth daily.    ? TRESIBA FLEXTOUCH 100 UNIT/ML SOPN FlexTouch Pen Inject 25 Units into the skin daily.    ? ibuprofen (ADVIL) 800 MG tablet Take 1 tablet (800 mg total) by mouth every 8 (eight) hours as needed. Use for cramping (Patient not taking: Reported on 08/30/2021) 60 tablet 1  ? ?No current facility-administered medications for this visit.  ? ? ?Allergies:   Patient has no known allergies.  ? ? ?ROS:  Please see the history of present  illness.   Otherwise, review of systems are positive none.   All other systems are reviewed and negative.  ? ? ?PHYSICAL EXAM: ?VS:  BP 134/88   Pulse 91   Ht 5' 5"  (1.651 m)   Wt 277 lb 6.4 oz (125.8 kg)   LMP 10/07/2012   SpO2 97%   BMI 46.16 kg/m?  , BMI Body mass index is 46.16 kg/m?. ?GENERAL:  Well appearing ?NECK:  No jugular venous distention, waveform within normal limits, carotid upstroke brisk and symmetric, no bruits, no thyromegaly ?LUNGS:  Clear to auscultation bilaterally ?CHEST:  Unremarkable ?HEART:  PMI not displaced or sustained,S1 and S2 within normal limits, no S3, no S4, no clicks, no rubs, 3 out of 6 apical early peaking systolic murmur radiating at the aortic outflow tract, no diastolic murmurs ?ABD:  Flat, positive bowel sounds normal in frequency in pitch, no bruits, no rebound, no guarding, no midline pulsatile mass, no hepatomegaly, no splenomegaly ?EXT:  2 plus pulses throughout, no edema, no cyanosis no clubbing ? ? ?EKG:  EKG is   ordered today. ?The ekg ordered today demonstrates sinus rhythm, rate 91, axis within normal limits, intervals within normal limits, no acute ST-T wave changes. ? ? ?Recent Labs: ?08/19/2021: BUN 16; Creatinine, Ser 0.68; Hemoglobin 13.7; Platelets 251; Potassium 4.1; Sodium 138  ? ? ?Lipid Panel ?   ?Component Value Date/Time  ? CHOL 189 06/17/2013 0946  ? TRIG 82 06/17/2013 0946  ? HDL 56 06/17/2013 0946  ? CHOLHDL 3.4 06/17/2013 0946  ? VLDL 16 06/17/2013 0946  ? LDLCALC 117 (H) 06/17/2013 0946  ? ?  ? ?Wt Readings from Last 3 Encounters:  ?08/30/21 277 lb 6.4 oz (125.8 kg)  ?08/19/21 270 lb (122.5 kg)  ?07/31/21 280 lb 9.6 oz (127.3 kg)  ?  ? ? ?Other studies Reviewed: ?Additional studies/ records that were reviewed today include: Labs ?Review of the above records demonstrates: See elsewhere ? ? ?ASSESSMENT AND PLAN: ? ?AS:  This was mild on echo in 2020.  I will check another echocardiogram. ? ?DYSPNEA: This is improved with weight loss.  No change  in therapy.  ? ?HTN:   Blood pressure is mildly elevated but should come down with weight loss.  No change in therapy. ? ?DYSLIPIDEMIA:   Her LDL was 86 but the HDL was 52.  She will continue with meds as listed and we did talk about diet. ? ?DM:    The A1c was apparently 6.9 although I do not see  this.  The last one  I have is 7.3.  I talked to her about the possibility of semaglutide but since her sugar is being followed by Lennie Odor, PA I will defer to her management I asked patient to discuss this with her. ?  ? ? ?Current medicines are reviewed at length with the patient today.  The patient does not have concerns regarding medicines. ? ?The following changes have been made:  None ? ?Labs/ tests ordered today include:  ? ?Orders Placed This Encounter  ?Procedures  ? EKG 12-Lead  ? ECHOCARDIOGRAM COMPLETE  ? ? ? ?Disposition:   FU with me 12  months.   ? ? ?Signed, ?Minus Breeding, MD  ?08/30/2021 11:35 AM    ?[place] ?

## 2021-08-30 ENCOUNTER — Ambulatory Visit: Payer: Medicare HMO | Admitting: Cardiology

## 2021-08-30 ENCOUNTER — Other Ambulatory Visit: Payer: Self-pay

## 2021-08-30 ENCOUNTER — Encounter: Payer: Self-pay | Admitting: Cardiology

## 2021-08-30 VITALS — BP 134/88 | HR 91 | Ht 65.0 in | Wt 277.4 lb

## 2021-08-30 DIAGNOSIS — E785 Hyperlipidemia, unspecified: Secondary | ICD-10-CM

## 2021-08-30 DIAGNOSIS — I10 Essential (primary) hypertension: Secondary | ICD-10-CM | POA: Diagnosis not present

## 2021-08-30 DIAGNOSIS — R0602 Shortness of breath: Secondary | ICD-10-CM

## 2021-08-30 DIAGNOSIS — E118 Type 2 diabetes mellitus with unspecified complications: Secondary | ICD-10-CM | POA: Diagnosis not present

## 2021-08-30 DIAGNOSIS — I350 Nonrheumatic aortic (valve) stenosis: Secondary | ICD-10-CM

## 2021-08-30 NOTE — Patient Instructions (Signed)
Medication Instructions:  ?Your physician recommends that you continue on your current medications as directed. Please refer to the Current Medication list given to you today. ? ?*If you need a refill on your cardiac medications before your next appointment, please call your pharmacy* ? ?Testing/Procedures: ?Your physician has requested that you have an echocardiogram. Echocardiography is a painless test that uses sound waves to create images of your heart. It provides your doctor with information about the size and shape of your heart and how well your heart?s chambers and valves are working. This procedure takes approximately one hour. There are no restrictions for this procedure. ?This will be done at our [facility] location:  [address] ? ?Follow-Up: ?At [facility], you and your health needs are our priority.  As part of our continuing mission to provide you with exceptional heart care, we have created designated Provider Care Teams.  These Care Teams include your primary Cardiologist (physician) and Advanced Practice Providers (APPs -  Physician Assistants and Nurse Practitioners) who all work together to provide you with the care you need, when you need it. ? ?We recommend signing up for the patient portal called "MyChart".  Sign up information is provided on this After Visit Summary.  MyChart is used to connect with patients for Virtual Visits (Telemedicine).  Patients are able to view lab/test results, encounter notes, upcoming appointments, etc.  Non-urgent messages can be sent to your provider as well.   ?To learn more about what you can do with MyChart, go to [email or web address].   ? ?Your next appointment:   ?12 month(s) ? ?The format for your next appointment:   ?In Person ? ?Provider:   ?Minus Breeding, MD { ? ? ?

## 2021-09-10 ENCOUNTER — Ambulatory Visit
Admission: RE | Admit: 2021-09-10 | Discharge: 2021-09-10 | Disposition: A | Discharge status: Home / Self Care | Payer: Medicare HMO | Source: Ambulatory Visit | Attending: Physician Assistant | Admitting: Physician Assistant

## 2021-09-10 DIAGNOSIS — Z1231 Encounter for screening mammogram for malignant neoplasm of breast: Secondary | ICD-10-CM | POA: Diagnosis not present

## 2021-09-12 ENCOUNTER — Other Ambulatory Visit: Payer: Self-pay | Admitting: Physician Assistant

## 2021-09-12 DIAGNOSIS — R928 Other abnormal and inconclusive findings on diagnostic imaging of breast: Secondary | ICD-10-CM

## 2021-09-16 DIAGNOSIS — R8290 Unspecified abnormal findings in urine: Secondary | ICD-10-CM | POA: Diagnosis not present

## 2021-09-16 DIAGNOSIS — M109 Gout, unspecified: Secondary | ICD-10-CM | POA: Diagnosis not present

## 2021-09-16 DIAGNOSIS — G629 Polyneuropathy, unspecified: Secondary | ICD-10-CM | POA: Diagnosis not present

## 2021-09-16 DIAGNOSIS — E1169 Type 2 diabetes mellitus with other specified complication: Secondary | ICD-10-CM | POA: Diagnosis not present

## 2021-09-16 DIAGNOSIS — I350 Nonrheumatic aortic (valve) stenosis: Secondary | ICD-10-CM | POA: Diagnosis not present

## 2021-09-16 DIAGNOSIS — K219 Gastro-esophageal reflux disease without esophagitis: Secondary | ICD-10-CM | POA: Diagnosis not present

## 2021-09-16 DIAGNOSIS — I10 Essential (primary) hypertension: Secondary | ICD-10-CM | POA: Diagnosis not present

## 2021-09-16 DIAGNOSIS — E7800 Pure hypercholesterolemia, unspecified: Secondary | ICD-10-CM | POA: Diagnosis not present

## 2021-09-17 ENCOUNTER — Ambulatory Visit (HOSPITAL_COMMUNITY): Payer: Medicare HMO

## 2021-09-17 ENCOUNTER — Other Ambulatory Visit (HOSPITAL_COMMUNITY): Payer: Medicare HMO

## 2021-10-01 DIAGNOSIS — M7742 Metatarsalgia, left foot: Secondary | ICD-10-CM | POA: Diagnosis not present

## 2021-10-01 DIAGNOSIS — M79672 Pain in left foot: Secondary | ICD-10-CM | POA: Diagnosis not present

## 2021-10-03 ENCOUNTER — Other Ambulatory Visit: Payer: Self-pay | Admitting: Physician Assistant

## 2021-10-03 ENCOUNTER — Ambulatory Visit
Admission: RE | Admit: 2021-10-03 | Discharge: 2021-10-03 | Disposition: A | Discharge status: Home / Self Care | Payer: Medicare HMO | Source: Ambulatory Visit | Attending: Physician Assistant | Admitting: Physician Assistant

## 2021-10-03 DIAGNOSIS — R928 Other abnormal and inconclusive findings on diagnostic imaging of breast: Secondary | ICD-10-CM

## 2021-10-03 DIAGNOSIS — N6311 Unspecified lump in the right breast, upper outer quadrant: Secondary | ICD-10-CM

## 2021-10-08 ENCOUNTER — Ambulatory Visit (HOSPITAL_COMMUNITY): Payer: Medicare HMO | Attending: Internal Medicine

## 2021-10-08 DIAGNOSIS — I350 Nonrheumatic aortic (valve) stenosis: Secondary | ICD-10-CM | POA: Diagnosis not present

## 2021-10-08 LAB — ECHOCARDIOGRAM COMPLETE
AR max vel: 0.96 cm2
AV Area VTI: 1.13 cm2
AV Area mean vel: 0.92 cm2
AV Mean grad: 34 mmHg
AV Peak grad: 47.3 mmHg
Ao pk vel: 3.44 m/s
Area-P 1/2: 2.91 cm2
S' Lateral: 2.9 cm

## 2021-10-10 DIAGNOSIS — H40051 Ocular hypertension, right eye: Secondary | ICD-10-CM | POA: Diagnosis not present

## 2021-12-17 DIAGNOSIS — H524 Presbyopia: Secondary | ICD-10-CM | POA: Diagnosis not present

## 2021-12-17 DIAGNOSIS — H40053 Ocular hypertension, bilateral: Secondary | ICD-10-CM | POA: Diagnosis not present

## 2021-12-17 DIAGNOSIS — Z0100 Encounter for examination of eyes and vision without abnormal findings: Secondary | ICD-10-CM | POA: Diagnosis not present

## 2022-01-30 ENCOUNTER — Telehealth: Payer: Self-pay

## 2022-01-30 NOTE — Telephone Encounter (Signed)
Pt called and stated that she had surgery with Dr. Nelda Oliver earlier this year and was told if she started back bleeding to let her know.  Patient would like for someone to call her with advise on what to do.

## 2022-01-30 NOTE — Telephone Encounter (Signed)
Pt reports that last night she noticed a spot of blood twice last night after using the bathroom. She has some cramping. Discussed with Dr. Nelda Marseille, pt is to continue taking her pills and scheduled appointment. Appt made for Monday 8/28.

## 2022-02-03 ENCOUNTER — Ambulatory Visit: Payer: Medicare HMO | Admitting: Obstetrics & Gynecology

## 2022-02-17 ENCOUNTER — Encounter: Payer: Self-pay | Admitting: Obstetrics & Gynecology

## 2022-02-17 ENCOUNTER — Ambulatory Visit: Payer: Medicare HMO | Admitting: Obstetrics & Gynecology

## 2022-02-17 ENCOUNTER — Other Ambulatory Visit (HOSPITAL_COMMUNITY)
Admission: RE | Admit: 2022-02-17 | Discharge: 2022-02-17 | Disposition: A | Discharge status: Home / Self Care | Payer: Medicare HMO | Source: Ambulatory Visit | Attending: Obstetrics & Gynecology | Admitting: Obstetrics & Gynecology

## 2022-02-17 VITALS — BP 146/83 | HR 86 | Ht 65.0 in | Wt 266.0 lb

## 2022-02-17 DIAGNOSIS — R102 Pelvic and perineal pain: Secondary | ICD-10-CM | POA: Diagnosis not present

## 2022-02-17 DIAGNOSIS — N898 Other specified noninflammatory disorders of vagina: Secondary | ICD-10-CM | POA: Insufficient documentation

## 2022-02-17 DIAGNOSIS — N950 Postmenopausal bleeding: Secondary | ICD-10-CM

## 2022-02-17 NOTE — Progress Notes (Signed)
   GYN VISIT Patient name: Nancy Oliver MRN [id number]  Date of birth: [birth year] Chief Complaint:   Follow-up  History of Present Illness:   Nancy Oliver is a 61 y.o. [id number] PM female being seen today for the following concerns:  PMB: A few weeks ago- noted some BRB spots for a few days.  Also noted some mild cramping.  Seemed to have stopped.  Then had a brown discharge- about 5 days ago, just once when she wiped.  Having 5/10 cramping and sometimes at night 10/10 cramping.  Cramping also radiates to her back.  No pain medication.    Underwent hysteroscopy/D&C in March 2023 due to uterine polyp- benign findings.  Also notes some nausea- few weeks.  Decreased appetite.  +diarrhea- notes loose stool  Patient's last menstrual period was 10/07/2012.     06/19/2021   11:19 AM 09/13/2015    7:17 PM  Depression screen PHQ 2/9  Decreased Interest 3 0  Down, Depressed, Hopeless 3 1  PHQ - 2 Score 6 1  Altered sleeping 3   Tired, decreased energy 3   Change in appetite 0   Feeling bad or failure about yourself  0   Trouble concentrating 0   Moving slowly or fidgety/restless 0   Suicidal thoughts 0   PHQ-9 Score 12      Review of Systems:   Pertinent items are noted in HPI Denies fever/chills, dizziness, headaches, visual disturbances, fatigue, shortness of breath, chest pain, abdominal pain, vomiting. Pertinent History Reviewed:  Reviewed past medical,surgical, social, obstetrical and family history.  Reviewed problem list, medications and allergies. Physical Assessment:   Vitals:   02/17/22 1412  BP: (!) 146/83  Pulse: 86  Weight: 266 lb (120.7 kg)  Height: '5\' 5"'$  (1.651 m)  Body mass index is 44.26 kg/m.       Physical Examination:   General appearance: alert, well appearing, and in no distress  Psych: mood appropriate, normal affect  Skin: warm & dry   Cardiovascular: normal heart rate noted  Respiratory: normal respiratory effort, no distress  Abdomen: obese,  soft, no rebound, no guarding, +LLQ tenderness with deep palpation  Pelvic: VULVA: normal appearing vulva with no masses, tenderness or lesions, VAGINA: normal appearing vagina with normal color minimal white discharge, no lesions CERVIX: normal appearing cervix without discharge or lesions, UTERUS: uterus is normal size, shape, consistency and nontender, ADNEXA: normal adnexa in size, nontender and no masses  Extremities: no calf tenderness bilaterally   Chaperone:  pt declined     Assessment & Plan:  1) Postmenopausal bleeding/discharge,  Pelvic pain -plan for SHG due to prior polypectomy- want to confirm complete removal -additionally [place] will allow for evaluation of ovarian etiology as explanation of her pelvic pain -further management pending results of [place] -will also r/o underlying infection  Return for [facility] next available.   Janyth Pupa, DO Attending [place], [facility] for [employer], [place]

## 2022-02-19 LAB — CERVICOVAGINAL ANCILLARY ONLY
Bacterial Vaginitis (gardnerella): NEGATIVE
Candida Glabrata: NEGATIVE
Candida Vaginitis: NEGATIVE
Comment: NEGATIVE
Comment: NEGATIVE
Comment: NEGATIVE

## 2022-03-04 DIAGNOSIS — K649 Unspecified hemorrhoids: Secondary | ICD-10-CM | POA: Diagnosis not present

## 2022-03-04 DIAGNOSIS — Z8371 Family history of colonic polyps: Secondary | ICD-10-CM | POA: Diagnosis not present

## 2022-03-04 DIAGNOSIS — D125 Benign neoplasm of sigmoid colon: Secondary | ICD-10-CM | POA: Diagnosis not present

## 2022-03-04 DIAGNOSIS — Z1211 Encounter for screening for malignant neoplasm of colon: Secondary | ICD-10-CM | POA: Diagnosis not present

## 2022-03-06 DIAGNOSIS — D125 Benign neoplasm of sigmoid colon: Secondary | ICD-10-CM | POA: Diagnosis not present

## 2022-03-17 ENCOUNTER — Other Ambulatory Visit: Payer: Self-pay | Admitting: Obstetrics & Gynecology

## 2022-03-17 DIAGNOSIS — N950 Postmenopausal bleeding: Secondary | ICD-10-CM

## 2022-03-18 ENCOUNTER — Ambulatory Visit: Payer: Medicare HMO | Admitting: Obstetrics & Gynecology

## 2022-03-18 ENCOUNTER — Encounter: Payer: Self-pay | Admitting: Obstetrics & Gynecology

## 2022-03-18 ENCOUNTER — Ambulatory Visit (INDEPENDENT_AMBULATORY_CARE_PROVIDER_SITE_OTHER): Payer: Medicare HMO

## 2022-03-18 VITALS — BP 134/88 | HR 85 | Ht 65.0 in | Wt 263.0 lb

## 2022-03-18 DIAGNOSIS — R3989 Other symptoms and signs involving the genitourinary system: Secondary | ICD-10-CM

## 2022-03-18 DIAGNOSIS — N950 Postmenopausal bleeding: Secondary | ICD-10-CM

## 2022-03-18 NOTE — Progress Notes (Addendum)
   GYN VISIT Patient name: Nancy Oliver MRN [id number]  Date of birth: 02-25-1961 Chief Complaint:   post menopausal bleeding   History of Present Illness:   Nancy Oliver is a 61 y.o. G9P2012 female being seen today for follow up regarding the following:  -Last seen mid-September and had reported a few days of BRB spots with cramping followed by brown discharge.  Of note, s/p Hysteroscopy/D&C with removal of uterine polyp in March 2023.  Since her last visit, she has not had any further bleeding.    She is still reporting pelvic pain- seems to come and go.  Does not typically take medication.  Some urinary discomfort and notes worsening pain after she voids.  No fever or chills.  Some backache.  Patient's last menstrual period was 10/07/2012.     06/19/2021   11:19 AM 09/13/2015    7:17 PM  Depression screen PHQ 2/9  Decreased Interest 3 0  Down, Depressed, Hopeless 3 1  PHQ - 2 Score 6 1  Altered sleeping 3   Tired, decreased energy 3   Change in appetite 0   Feeling bad or failure about yourself  0   Trouble concentrating 0   Moving slowly or fidgety/restless 0   Suicidal thoughts 0   PHQ-9 Score 12      Review of Systems:   Pertinent items are noted in HPI Denies fever/chills, dizziness, headaches, visual disturbances, fatigue, shortness of breath, chest pain, abdominal pain, vomiting. Pertinent History Reviewed:  Reviewed past medical,surgical, social, obstetrical and family history.  Reviewed problem list, medications and allergies. Physical Assessment:   Vitals:   03/18/22 1425  BP: 134/88  Pulse: 85  Weight: 263 lb (119.3 kg)  Height: '5\' 5"'$  (1.651 m)  Body mass index is 43.77 kg/m.       Physical Examination:   General appearance: alert, well appearing, and in no distress  Psych: mood appropriate, normal affect  Skin: warm & dry   Cardiovascular: normal heart rate noted  Respiratory: normal respiratory effort, no distress  Abdomen: obese, soft,  non-tender   Pelvic: VULVA: normal appearing vulva with no masses, tenderness or lesions, VAGINA: normal appearing vagina with normal color and discharge, no lesions, CERVIX: normal appearing cervix without discharge or lesions- completed as part of SHG  Extremities: no edema   Chaperone:  Dr. Owens Shark      Pelvic US: heterogenous anteverted uterus,thickened endometrium 10.7 mm,normal ovaries,sonohysterogram was preformed by Dr. Nelda Marseille with ultrasound guidance throughout the procedure.Saline outlined  a 7 x 4 mm hypoechoic irregularity in the posterior wall of the endometrium (limited view),no color flow visualized    Assessment & Plan:  1) PMB -  There was a small area that appear irregular, but minimal with no flow, did not appear consistent with polyp or other concern - See phone note- due to continued thickness and benign findings with D&C, plan to start on progesterone -should bleeding return or continue may need to consider next step  2) Dysuria -UA and culture collected, further management pending results  F/U in 39yrfor annual or as needed   Orders Placed This Encounter  Procedures   Urine Culture   Urinalysis, Routine w reflex microscopic    Return for Jan 2024.   JJanyth Pupa DO Attending [place] [facility] [place]

## 2022-03-18 NOTE — Progress Notes (Signed)
PELVIC US TA/TV W/SONOHYSEROGRAM: heterogenous anteverted uterus,thickened endometrium 10.7 mm,normal ovaries,sonohysterogram was preformed by Dr. Nelda Marseille with ultrasound guidance throughout the procedure.Saline outlined  a 7 x 4 mm hypoechoic irregularity in the posterior wall of the endometrium (limited view),no color flow visualized

## 2022-03-19 LAB — MICROSCOPIC EXAMINATION: Casts: NONE SEEN /lpf

## 2022-03-19 LAB — URINALYSIS, ROUTINE W REFLEX MICROSCOPIC
Bilirubin, UA: NEGATIVE
Glucose, UA: NEGATIVE
Ketones, UA: NEGATIVE
Nitrite, UA: NEGATIVE
Protein,UA: NEGATIVE
RBC, UA: NEGATIVE
Specific Gravity, UA: 1.017 (ref 1.005–1.030)
Urobilinogen, Ur: 0.2 mg/dL (ref 0.2–1.0)
pH, UA: 6 (ref 5.0–7.5)

## 2022-03-20 ENCOUNTER — Other Ambulatory Visit: Payer: Self-pay | Admitting: Obstetrics & Gynecology

## 2022-03-20 DIAGNOSIS — H40013 Open angle with borderline findings, low risk, bilateral: Secondary | ICD-10-CM | POA: Diagnosis not present

## 2022-03-20 DIAGNOSIS — R3989 Other symptoms and signs involving the genitourinary system: Secondary | ICD-10-CM

## 2022-03-20 LAB — URINE CULTURE

## 2022-03-20 MED ORDER — NITROFURANTOIN MONOHYD MACRO 100 MG PO CAPS: 100.0000 mg | ORAL_CAPSULE | Freq: Two times a day (BID) | ORAL | 0 refills | Status: DC

## 2022-03-20 NOTE — Progress Notes (Signed)
Rx for UTI  Janyth Pupa, DO Attending [place], [facility] for [facility], [place]

## 2022-03-27 ENCOUNTER — Telehealth: Payer: Self-pay | Admitting: Obstetrics & Gynecology

## 2022-03-27 DIAGNOSIS — R9389 Abnormal findings on diagnostic imaging of other specified body structures: Secondary | ICD-10-CM

## 2022-03-27 MED ORDER — PROGESTERONE MICRONIZED 100 MG PO CAPS: 100.0000 mg | ORAL_CAPSULE | Freq: Every day | ORAL | 4 refills | Status: DC

## 2022-03-27 NOTE — Telephone Encounter (Signed)
Called to review management.  Pt able to get Rx for UTI and has noted improvement.  Reviewed [place] report, while there does seem to be one area that was likely the "source" of the thickness the lining still was above the cut off.  Since D&C already completed, recommend progesterone.  Rx sent in, pt to call in with any concerns or issues.  Otherwise, plan to re-evaluate at annual.  Janyth Pupa, DO Attending [place], [facility] for [employer], [place]

## 2022-04-07 ENCOUNTER — Ambulatory Visit
Admission: RE | Admit: 2022-04-07 | Discharge: 2022-04-07 | Disposition: A | Discharge status: Home / Self Care | Payer: Medicare HMO | Source: Ambulatory Visit | Attending: Physician Assistant | Admitting: Physician Assistant

## 2022-04-07 DIAGNOSIS — N6311 Unspecified lump in the right breast, upper outer quadrant: Secondary | ICD-10-CM

## 2022-04-07 NOTE — Progress Notes (Signed)
Has been    Cardiology Office Note   Date:  04/09/2022   ID:  Nancy Oliver, DOB 05-21-1961, MRN [id number]  PCP:  Lennie Odor, PA  Cardiologist:   Minus Breeding, MD   Chief Complaint  Patient presents with   Palpitations      History of Present Illness: Nancy Oliver is a 61 y.o. female who presents for follow up of murmur.  She also was found apparently to have some ectopy though I do not have an EKG.  She had an echo in March 2020 with an EF of 60 - 65% with some mild aortic stenosis with a tricuspid valve but with some calcification.  In March 2022 she had SOB with a negative POET (Plain Old Exercise Treadmill).  Since I last saw her she has had some slightly increased short of breath.  She gets short of breath when she walks up to feed her cats or do her vacuuming which is her most physical activity.  She will get fluttering and some sweating.  She mentions some chest discomfort which she has mentioned before but this does not happen with the physical activity.  She feels some chest pressure lying down at night.  She says the palpitations seem to be more frequent and happening sporadically and every day.  She will feel multiple rapid beats in a row.  She has not actually lost consciousness.  She is not describing PND or orthopnea.  She is not describing new weight gain or edema.  Her echo did demonstrate that she went from very mild aortic stenosis in 2022 having now a mean gradient of 34 and a DVI of 0.33.   Past Medical History:  Diagnosis Date   Anxiety    Arthritis    Depression    Diabetes mellitus without complication ([place])    Heart murmur    Hyperlipidemia    Hypertension    Migraines    Neuropathy    Obesity    Right sided sciatica     Past Surgical History:  Procedure Laterality Date   BREAST SURGERY Left 04/2014   biopsy    CESAREAN SECTION     HYSTEROSCOPY WITH D & C N/A 08/20/2021   Procedure: DILATATION AND CURETTAGE /HYSTEROSCOPY;  Surgeon: Janyth Pupa, DO;  Location: [facility];  Service: Gynecology;  Laterality: N/A;   KNEE SURGERY     NASAL SEPTOPLASTY W/ TURBINOPLASTY     POLYPECTOMY N/A 08/20/2021   Procedure: POLYPECTOMY(WITH MYOSURE DEVICE);  Surgeon: Janyth Pupa, DO;  Location: [facility];  Service: Gynecology;  Laterality: N/A;     Current Outpatient Medications  Medication Sig Dispense Refill   ACCU-CHEK AVIVA PLUS test strip      allopurinol (ZYLOPRIM) 300 MG tablet Take 300 mg by mouth daily.     ALPRAZolam (XANAX) 0.5 MG tablet TAKE ONE TABLET BY MOUTH EVERY 8 HOURS AS NEEDED 60 tablet 0   aspirin-acetaminophen-caffeine (EXCEDRIN MIGRAINE) 854-627-03 MG per tablet Take 2 tablets by mouth every 6 (six) hours as needed for headache.     atorvastatin (LIPITOR) 80 MG tablet Take 80 mg by mouth daily.     Blood Glucose Monitoring Suppl (ACCU-CHEK AVIVA PLUS) w/Device KIT See admin instructions.     Dulaglutide (TRULICITY) 1.5 JK/0.9FG SOPN Inject 1.5 mg into the skin every Monday.     DULoxetine (CYMBALTA) 60 MG capsule Take 60 mg by mouth daily.     furosemide (LASIX) 20 MG tablet Take 20 mg by mouth  daily.     gabapentin (NEURONTIN) 300 MG capsule Take 300-600 mg by mouth See admin instructions. 300 mg twice daily, 600 mg at bedtime     Glycerin-Hypromellose-PEG 400 (DRY EYE RELIEF DROPS) 0.2-0.2-1 % SOLN Place 1 drop into both eyes daily as needed (dry eyes).     ibuprofen (ADVIL) 800 MG tablet Take 1 tablet (800 mg total) by mouth every 8 (eight) hours as needed. Use for cramping 60 tablet 1   losartan-hydrochlorothiazide (HYZAAR) 100-12.5 MG tablet Take 1 tablet by mouth daily.     metFORMIN (GLUCOPHAGE) 1000 MG tablet Take 1,000 mg by mouth 2 (two) times daily with a meal.     nitrofurantoin, macrocrystal-monohydrate, (MACROBID) 100 MG capsule Take 1 capsule (100 mg total) by mouth 2 (two) times daily. 14 capsule 0   omeprazole (PRILOSEC) 40 MG capsule Take 40 mg by mouth daily.     progesterone (PROMETRIUM) 100 MG  capsule Take 1 capsule (100 mg total) by mouth daily. 90 capsule 4   TRESIBA FLEXTOUCH 100 UNIT/ML SOPN FlexTouch Pen Inject 25 Units into the skin daily.     No current facility-administered medications for this visit.    Allergies:   Patient has no known allergies.    ROS:  Please see the history of present illness.   Otherwise, review of systems are positive none.   All other systems are reviewed and negative.    PHYSICAL EXAM: VS:  BP (!) 142/86   Pulse 69   Ht _0  (1.676 m)   Wt 267 lb 6.4 oz (121.3 kg)   LMP 10/07/2012   SpO2 99%   BMI 43.16 kg/m  , BMI Body mass index is 43.16 kg/m. GENERAL:  Well appearing NECK:  No jugular venous distention, waveform within normal limits, carotid upstroke brisk and symmetric, no bruits, no thyromegaly LUNGS:  Clear to auscultation bilaterally CHEST:  Unremarkable HEART:  PMI not displaced or sustained,S1 and S2 within normal limits, no S3, no S4, no clicks, no rubs, 3 out of 6 apical systolic murmur mid to late peaking and radiating up the aortic outflow tract, no diastolic murmurs ABD:  Flat, positive bowel sounds normal in frequency in pitch, no bruits, no rebound, no guarding, no midline pulsatile mass, no hepatomegaly, no splenomegaly EXT:  2 plus pulses throughout, no edema, no cyanosis no clubbing    EKG:  EKG is   ordered today. The ekg ordered today demonstrates sinus rhythm, rate sinus rhythm, rate 69, axis within normal limits, intervals within normal limits, no acute ST-T wave changes.,  PVCs.   Recent Labs: 08/19/2021: BUN 16; Creatinine, Ser 0.68; Hemoglobin 13.7; Platelets 251; Potassium 4.1; Sodium 138    Lipid Panel    Component Value Date/Time   CHOL 189 06/17/2013 0946   TRIG 82 06/17/2013 0946   HDL 56 06/17/2013 0946   CHOLHDL 3.4 06/17/2013 0946   VLDL 16 06/17/2013 0946   LDLCALC 117 (H) 06/17/2013 0946      Wt Readings from Last 3 Encounters:  04/09/22 267 lb 6.4 oz (121.3 kg)  03/18/22 263 lb  (119.3 kg)  02/17/22 266 lb (120.7 kg)      Other studies Reviewed: Additional studies/ records that were reviewed today include: Echocardiogram, labs Review of the above records demonstrates: See elsewhere   ASSESSMENT AND PLAN:  AS:    This was mild on echo in 2020.  This has progressed and I am going to repeat now a 79-monthecho.  I think she  is going to be headed towards valve surgery and we discussed this.  The timing of this will be based on the results of follow-up studies.    DYSPNEA:   This may be related to weight although she is more short of breath even though she has had weight loss.  This could be related to the valve.  Further evaluation as above.  HTN:   Blood pressure is mildly elevated and she is going to return a blood pressure diary to me after about 2 weeks.   DYSLIPIDEMIA:   Her LDL was 86.  No change in therapy.  DM:    The A1c was 7.1 which is up from 6.9.  I defer management to Lennie Odor, [place].  We did talk about the importance of weight loss and physical activity particularly as she is preparing for a probable open chest surgery.   PALPITATIONS: I suspect she is feeling some PVCs or PACs.  I will apply a 3-day monitor.  Current medicines are reviewed at length with the patient today.  The patient does not have concerns regarding medicines.  The following changes have been made:  None  Labs/ tests ordered today include:   Orders Placed This Encounter  Procedures   LONG TERM MONITOR (3-14 DAYS)   EKG 12-Lead   ECHOCARDIOGRAM COMPLETE     Disposition:   FU with me to  months.     Signed, Minus Breeding, MD  04/09/2022 9:40 AM    [place]

## 2022-04-09 ENCOUNTER — Ambulatory Visit: Payer: Medicare HMO | Attending: Cardiology | Admitting: Cardiology

## 2022-04-09 ENCOUNTER — Ambulatory Visit (INDEPENDENT_AMBULATORY_CARE_PROVIDER_SITE_OTHER): Payer: Medicare HMO

## 2022-04-09 ENCOUNTER — Encounter: Payer: Self-pay | Admitting: Cardiology

## 2022-04-09 VITALS — BP 142/86 | HR 69 | Ht 66.0 in | Wt 267.4 lb

## 2022-04-09 DIAGNOSIS — I350 Nonrheumatic aortic (valve) stenosis: Secondary | ICD-10-CM

## 2022-04-09 DIAGNOSIS — R002 Palpitations: Secondary | ICD-10-CM

## 2022-04-09 DIAGNOSIS — E118 Type 2 diabetes mellitus with unspecified complications: Secondary | ICD-10-CM

## 2022-04-09 DIAGNOSIS — R0602 Shortness of breath: Secondary | ICD-10-CM | POA: Diagnosis not present

## 2022-04-09 DIAGNOSIS — I10 Essential (primary) hypertension: Secondary | ICD-10-CM

## 2022-04-09 DIAGNOSIS — E785 Hyperlipidemia, unspecified: Secondary | ICD-10-CM

## 2022-04-09 NOTE — Patient Instructions (Addendum)
Medication Instructions:  No changes *If you need a refill on your cardiac medications before your next appointment, please call your pharmacy*   Lab Work: None ordered If you have labs (blood work) drawn today and your tests are completely normal, you will receive your results only by: [place] (if you have MyChart) OR A paper copy in the mail If you have any lab test that is abnormal or we need to change your treatment, we will call you to review the results.   Testing/Procedures: Your physician has requested that you have an echocardiogram. Echocardiography is a painless test that uses sound waves to create images of your heart. It provides your doctor with information about the size and shape of your heart and how well your heart's chambers and valves are working. You may receive an ultrasound enhancing agent through an IV if needed to better visualize your heart during the echo.This procedure takes approximately one hour. There are no restrictions for this procedure. This will take place at the [address], [address].    Follow-Up: At [facility], you and your health needs are our priority.  As part of our continuing mission to provide you with exceptional heart care, we have created designated Provider Care Teams.  These Care Teams include your primary Cardiologist (physician) and Advanced Practice Providers (APPs -  Physician Assistants and Nurse Practitioners) who all work together to provide you with the care you need, when you need it.  We recommend signing up for the patient portal called "MyChart".  Sign up information is provided on this After Visit Summary.  MyChart is used to connect with patients for Virtual Visits (Telemedicine).  Patients are able to view lab/test results, encounter notes, upcoming appointments, etc.  Non-urgent messages can be sent to your provider as well.   To learn more about what you can do with MyChart, go to  [email or web address].    Your next appointment:   2 month(s)  The format for your next appointment:   In Person  Provider:   Minus Breeding, MD  or APP  Dr. Percival Spanish would like you to check your blood pressure daily for the next 2 weeks.  Keep a journal of these daily blood pressure and heart rate readings and call our office or send a message through [place] with the results. Thank you!  It is best to check your BP 1-2 hours after taking your medications to see the medications effectiveness on your BP.    Here are some tips that our clinical pharmacists share for home BP monitoring:          Rest 10 minutes before taking your blood pressure.          Don't smoke or drink caffeinated beverages for at least 30 minutes before.          Take your blood pressure before (not after) you eat.          Sit comfortably with your back supported and both feet on the floor (don't cross your legs).          Elevate your arm to heart level on a table or a desk.          Use the proper sized cuff. It should fit smoothly and snugly around your bare upper arm. There should be enough room to slip a fingertip under the cuff. The bottom edge of the cuff should be 1 inch above the crease of the elbow.  Other Instructions ZIO XT- Long Term Monitor Instructions  Your physician has requested you wear a ZIO patch monitor for 3 days.  This is a single patch monitor. [employer] supplies one patch monitor per enrollment. Additional stickers are not available. Please do not apply patch if you will be having a Nuclear Stress Test,  Echocardiogram, Cardiac CT, MRI, or Chest Xray during the period you would be wearing the  monitor. The patch cannot be worn during these tests. You cannot remove and re-apply the  ZIO XT patch monitor.  Your ZIO patch monitor will be mailed 3 day USPS to your address on file. It may take 3-5 days  to receive your monitor after you have been enrolled.  Once you have received  your monitor, please review the enclosed instructions. Your monitor  has already been registered assigning a specific monitor serial # to you.  Billing and Patient Assistance Program Information  We have supplied [employer] with any of your insurance information on file for billing purposes. [employer] offers a sliding scale Patient Assistance Program for patients that do not have  insurance, or whose insurance does not completely cover the cost of the ZIO monitor.  You must apply for the Patient Assistance Program to qualify for this discounted rate.  To apply, please call [employer] at [phone], select option 4, select option 2, ask to apply for  Patient Assistance Program. Theodore Demark will ask your household income, and how many people  are in your household. They will quote your out-of-pocket cost based on that information.  [employer] will also be able to set up a 41-month interest-free payment plan if needed.  Applying the monitor   Shave hair from upper left chest.  Hold abrader disc by orange tab. Rub abrader in 40 strokes over the upper left chest as  indicated in your monitor instructions.  Clean area with 4 enclosed alcohol pads. Let dry.  Apply patch as indicated in monitor instructions. Patch will be placed under collarbone on left  side of chest with arrow pointing upward.  Rub patch adhesive wings for 2 minutes. Remove white label marked "1". Remove the white  label marked "2". Rub patch adhesive wings for 2 additional minutes.  While looking in a mirror, press and release button in center of patch. A small green light will  flash 3-4 times. This will be your only indicator that the monitor has been turned on.  Do not shower for the first 24 hours. You may shower after the first 24 hours.  Press the button if you feel a symptom. You will hear a small click. Record Date, Time and  Symptom in the Patient Logbook.  When you are ready to remove the patch, follow instructions on the last  2 pages of Patient  Logbook. Stick patch monitor onto the last page of Patient Logbook.  Place Patient Logbook in the blue and white box. Use locking tab on box and tape box closed  securely. The blue and white box has prepaid postage on it. Please place it in the mailbox as  soon as possible. Your physician should have your test results approximately 7 days after the  monitor has been [facility]  Call [place]at [phone] you have questions regarding  your ZIO XT patch monitor. Call them immediately if you see an orange light blinking on your  monitor.  If your monitor falls off in less than 4 days, contact our Monitor department at [phone]  If your monitor becomes loose or falls off after 4 days call [employer] at [phone] for  suggestions on securing your monitor   Important Information About [place]

## 2022-04-09 NOTE — Progress Notes (Unsigned)
Enrolled patient for a 3 day Zio XT monitor to be mailed to patients home  

## 2022-04-12 DIAGNOSIS — R002 Palpitations: Secondary | ICD-10-CM

## 2022-04-21 DIAGNOSIS — R002 Palpitations: Secondary | ICD-10-CM | POA: Diagnosis not present

## 2022-04-22 DIAGNOSIS — M109 Gout, unspecified: Secondary | ICD-10-CM | POA: Diagnosis not present

## 2022-04-22 DIAGNOSIS — K219 Gastro-esophageal reflux disease without esophagitis: Secondary | ICD-10-CM | POA: Diagnosis not present

## 2022-04-22 DIAGNOSIS — G4730 Sleep apnea, unspecified: Secondary | ICD-10-CM | POA: Diagnosis not present

## 2022-04-22 DIAGNOSIS — E1169 Type 2 diabetes mellitus with other specified complication: Secondary | ICD-10-CM | POA: Diagnosis not present

## 2022-04-22 DIAGNOSIS — G629 Polyneuropathy, unspecified: Secondary | ICD-10-CM | POA: Diagnosis not present

## 2022-04-22 DIAGNOSIS — I10 Essential (primary) hypertension: Secondary | ICD-10-CM | POA: Diagnosis not present

## 2022-04-22 DIAGNOSIS — Z0000 Encounter for general adult medical examination without abnormal findings: Secondary | ICD-10-CM | POA: Diagnosis not present

## 2022-04-22 DIAGNOSIS — E7800 Pure hypercholesterolemia, unspecified: Secondary | ICD-10-CM | POA: Diagnosis not present

## 2022-04-29 ENCOUNTER — Ambulatory Visit (HOSPITAL_COMMUNITY): Payer: Medicare HMO | Attending: Cardiology

## 2022-04-29 DIAGNOSIS — I350 Nonrheumatic aortic (valve) stenosis: Secondary | ICD-10-CM | POA: Diagnosis not present

## 2022-04-29 LAB — ECHOCARDIOGRAM COMPLETE
AR max vel: 0.95 cm2
AV Area VTI: 1.22 cm2
AV Area mean vel: 0.97 cm2
AV Mean grad: 38 mmHg
AV Peak grad: 67.6 mmHg
Ao pk vel: 4.11 m/s
Area-P 1/2: 2.6 cm2
S' Lateral: 2.6 cm

## 2022-04-30 ENCOUNTER — Encounter: Payer: Self-pay | Admitting: *Deleted

## 2022-05-06 ENCOUNTER — Encounter: Payer: Self-pay | Admitting: *Deleted

## 2022-06-11 DIAGNOSIS — M25561 Pain in right knee: Secondary | ICD-10-CM | POA: Diagnosis not present

## 2022-06-11 DIAGNOSIS — M1711 Unilateral primary osteoarthritis, right knee: Secondary | ICD-10-CM | POA: Diagnosis not present

## 2022-06-19 ENCOUNTER — Ambulatory Visit: Payer: Medicare HMO | Admitting: Cardiology

## 2022-07-01 NOTE — Progress Notes (Unsigned)
Cardiology Office Note   Date:  07/03/2022   ID:  Nancy Oliver, DOB 04-20-1961, MRN [id number]  PCP:  Lennie Odor, PA  Cardiologist:   Minus Breeding, MD   Chief Complaint  Patient presents with   Aortic Stenosis      History of Present Illness: Nancy Oliver is a 62 y.o. female who presents for follow up of murmur.  She also was found apparently to have some ectopy though I do not have an EKG.  She had an echo in March 2020 with an EF of 60 - 65% with some mild aortic stenosis with a tricuspid valve but with some calcification.  In March 2022 she had SOB with a negative POET (Plain Old Exercise Treadmill).  Her echo did demonstrate that she went from mild aortic stenosis in 2020 having now a mean gradient of 34 and a DVI of 0.33.  This was in the spring last year.  I did repeat at 4-monthechocardiogram and the gradient was about the same measuring around 38.  It is difficult to assess symptoms.  Since I last saw her she has lost about 30 pounds.  However, despite this weight loss and also being removed from the stressful situation having left her husband and the many cats she was taking care of she is still having shortness of breath.  She says she gets short of breath showering.  She gets very hot afterwards and has to stand in with a fan in her kitchen and cool off.  Does not sound like she is particularly active though she does some grocery shopping and a little mopping.  She is not describing resting shortness of breath, PND or orthopnea.  Is not describing any chest pressure, neck or arm discomfort.   Past Medical History:  Diagnosis Date   Anxiety    Arthritis    Depression    [place]    Heart murmur    Hyperlipidemia    Hypertension    Migraines    Neuropathy    Obesity    Right sided sciatica     Past Surgical History:  Procedure Laterality Date   BREAST SURGERY Left 04/2014   biopsy    CESAREAN SECTION      HYSTEROSCOPY WITH D & C N/A 08/20/2021   Procedure: DILATATION AND CURETTAGE /HYSTEROSCOPY;  Surgeon: OJanyth Pupa DO;  Location: [facility];  Service: Gynecology;  Laterality: N/A;   KNEE SURGERY     NASAL SEPTOPLASTY W/ TURBINOPLASTY     POLYPECTOMY N/A 08/20/2021   Procedure: POLYPECTOMY(WITH MYOSURE DEVICE);  Surgeon: OJanyth Pupa DO;  Location: [facility];  Service: Gynecology;  Laterality: N/A;   Family History  Problem Relation Age of Onset   COPD Father    Muscular dystrophy Sister    Lupus Son    Social Connections: Unknown (06/19/2021)   Social Connection and Isolation Panel [NHANES]    Frequency of Communication with Friends and Family: Patient refused    Frequency of Social Gatherings with Friends and Family: Patient refused    Attends Religious Services: Patient refused    AMarine scientistor Organizations: No    Attends CMusic therapist Never    Marital Status: Married     Current Outpatient Medications  Medication Sig Dispense Refill   ACCU-CHEK AVIVA PLUS test strip      allopurinol (ZYLOPRIM) 300 MG tablet Take 300 mg by mouth daily.  ALPRAZolam (XANAX) 0.5 MG tablet TAKE ONE TABLET BY MOUTH EVERY 8 HOURS AS NEEDED 60 tablet 0   aspirin-acetaminophen-caffeine (EXCEDRIN MIGRAINE) 656-812-75 MG per tablet Take 2 tablets by mouth every 6 (six) hours as needed for headache.     atorvastatin (LIPITOR) 80 MG tablet Take 80 mg by mouth daily.     Blood Glucose Monitoring Suppl (ACCU-CHEK AVIVA PLUS) w/Device KIT See admin instructions.     DULoxetine (CYMBALTA) 60 MG capsule Take 60 mg by mouth daily.     furosemide (LASIX) 20 MG tablet Take 20 mg by mouth daily.     gabapentin (NEURONTIN) 300 MG capsule Take 300-600 mg by mouth See admin instructions. 300 mg twice daily, 600 mg at bedtime     Glycerin-Hypromellose-PEG 400 (DRY EYE RELIEF DROPS) 0.2-0.2-1 % SOLN Place 1 drop into both eyes daily as needed (dry eyes).     ibuprofen (ADVIL) 800 MG tablet  Take 1 tablet (800 mg total) by mouth every 8 (eight) hours as needed. Use for cramping 60 tablet 1   losartan-hydrochlorothiazide (HYZAAR) 100-12.5 MG tablet Take 1 tablet by mouth daily.     metFORMIN (GLUCOPHAGE) 1000 MG tablet Take 1,000 mg by mouth 2 (two) times daily with a meal.     nitrofurantoin, macrocrystal-monohydrate, (MACROBID) 100 MG capsule Take 1 capsule (100 mg total) by mouth 2 (two) times daily. 14 capsule 0   omeprazole (PRILOSEC) 40 MG capsule Take 40 mg by mouth daily.     TRESIBA FLEXTOUCH 100 UNIT/ML SOPN FlexTouch Pen Inject 25 Units into the skin daily.     TRULICITY 4.5 TZ/0.0FV SOPN Inject 4.5 mg into the skin once a week.     progesterone (PROMETRIUM) 100 MG capsule Take 1 capsule (100 mg total) by mouth daily. 90 capsule 4   No current facility-administered medications for this visit.    Allergies:   Patient has no known allergies.    ROS:  Please see the history of present illness.   Otherwise, review of systems are positive none.   All other systems are reviewed and negative.    PHYSICAL EXAM: VS:  BP 112/86 (BP Location: Left Arm, Patient Position: Sitting, Cuff Size: Normal)   Pulse (!) 101   Ht '5\' 6"'$  (1.676 m)   Wt 240 lb (108.9 kg)   LMP 10/07/2012   SpO2 97%   BMI 38.74 kg/m  , BMI Body mass index is 38.74 kg/m. GENERAL:  Well appearing NECK:  No jugular venous distention, waveform within normal limits, carotid upstroke brisk and symmetric, no bruits, no thyromegaly LUNGS:  Clear to auscultation bilaterally CHEST:  Unremarkable HEART:  PMI not displaced or sustained,S1 and S2 within normal limits, no S3, no S4, no clicks, no rubs, 3 out of 6 apical systolic murmur mid peaking and radiating at aortic outflow tract, no diastolic murmurs ABD:  Flat, positive bowel sounds normal in frequency in pitch, no bruits, no rebound, no guarding, no midline pulsatile mass, no hepatomegaly, no splenomegaly EXT:  2 plus pulses throughout, no edema, no cyanosis  no clubbing   EKG:  EKG is not ordered today.  Recent Labs: 08/19/2021: BUN 16; Creatinine, Ser 0.68; Hemoglobin 13.7; Platelets 251; Potassium 4.1; Sodium 138    Lipid Panel    Component Value Date/Time   CHOL 189 06/17/2013 0946   TRIG 82 06/17/2013 0946   HDL 56 06/17/2013 0946   CHOLHDL 3.4 06/17/2013 0946   VLDL 16 06/17/2013 0946   LDLCALC 117 (H) 06/17/2013 0946  Wt Readings from Last 3 Encounters:  07/03/22 240 lb (108.9 kg)  04/09/22 267 lb 6.4 oz (121.3 kg)  03/18/22 263 lb (119.3 kg)      Other studies Reviewed: Additional studies/ records that were reviewed today include: Echo Review of the above records demonstrates: See elsewhere   ASSESSMENT AND PLAN:  AS:   This was severe.  She is short of breath and I think symptomatic with his valve demonstrated by the fact that she has not improved despite good weight loss.  I think it is time to consider replacement and she would be a candidate for SAVR.  I will schedule her for right and left heart cath.  The patient understands that risks included but are not limited to stroke (1 in 1000), death (1 in 40), kidney failure [usually temporary] (1 in 500), bleeding (1 in 200), allergic reaction [possibly serious] (1 in 200).  The patient understands and agrees to proceed.   HTN:   Blood pressure is she is target.  No change in therapy.    DYSLIPIDEMIA:   Her LDL was 122.  I will change her Lipitor to Crestor 40 mg PO daily.    DM:    The A1c was 7.4.  She is managed by her primary provider.   PALPITATIONS:    She had no significant arrhythmias on the monitor in November for 3 days.  No change in therapy.  Current medicines are reviewed at length with the patient today.  The patient does not have concerns regarding medicines.  The following changes have been made: As above  Labs/ tests ordered today include:   Orders Placed This Encounter  Procedures   Basic metabolic panel   CBC   EKG 12-Lead      Disposition:   FU with me to 2 months.     Signed, Minus Breeding, MD  07/03/2022 12:28 PM    [facility]

## 2022-07-01 NOTE — H&P (View-Only) (Signed)
Cardiology Office Note   Date:  07/03/2022   ID:  Nancy Oliver, DOB 11-24-1960, MRN [id number]  PCP:  Lennie Odor, PA  Cardiologist:   Minus Breeding, MD   Chief Complaint  Patient presents with   Aortic Stenosis      History of Present Illness: Nancy Oliver is a 62 y.o. female who presents for follow up of murmur.  She also was found apparently to have some ectopy though I do not have an EKG.  She had an echo in March 2020 with an EF of 60 - 65% with some mild aortic stenosis with a tricuspid valve but with some calcification.  In March 2022 she had SOB with a negative POET (Plain Old Exercise Treadmill).  Her echo did demonstrate that she went from mild aortic stenosis in 2020 having now a mean gradient of 34 and a DVI of 0.33.  This was in the spring last year.  I did repeat at 87-monthechocardiogram and the gradient was about the same measuring around 38.  It is difficult to assess symptoms.  Since I last saw her she has lost about 30 pounds.  However, despite this weight loss and also being removed from the stressful situation having left her husband and the many cats she was taking care of she is still having shortness of breath.  She says she gets short of breath showering.  She gets very hot afterwards and has to stand in with a fan in her kitchen and cool off.  Does not sound like she is particularly active though she does some grocery shopping and a little mopping.  She is not describing resting shortness of breath, PND or orthopnea.  Is not describing any chest pressure, neck or arm discomfort.   Past Medical History:  Diagnosis Date   Anxiety    Arthritis    Depression    Diabetes mellitus without complication [place]    Heart murmur    Hyperlipidemia    Hypertension    Migraines    Neuropathy    Obesity    Right sided sciatica     Past Surgical History:  Procedure Laterality Date   BREAST SURGERY Left 04/2014   biopsy    CESAREAN SECTION      HYSTEROSCOPY WITH D & C N/A 08/20/2021   Procedure: DILATATION AND CURETTAGE /HYSTEROSCOPY;  Surgeon: OJanyth Pupa DO;  Location: [facility];  Service: Gynecology;  Laterality: N/A;   KNEE SURGERY     NASAL SEPTOPLASTY W/ TURBINOPLASTY     POLYPECTOMY N/A 08/20/2021   Procedure: POLYPECTOMY(WITH MYOSURE DEVICE);  Surgeon: OJanyth Pupa DO;  Location: [facility];  Service: Gynecology;  Laterality: N/A;   Family History  Problem Relation Age of Onset   COPD Father    Muscular dystrophy Sister    Lupus Son    Social Connections: Unknown (06/19/2021)   Social Connection and Isolation Panel [NHANES]    Frequency of Communication with Friends and Family: Patient refused    Frequency of Social Gatherings with Friends and Family: Patient refused    Attends Religious Services: Patient refused    AMarine scientistor Organizations: No    Attends CMusic therapist Never    Marital Status: Married     Current Outpatient Medications  Medication Sig Dispense Refill   ACCU-CHEK AVIVA PLUS test strip      allopurinol (ZYLOPRIM) 300 MG tablet Take 300 mg by mouth daily.  ALPRAZolam (XANAX) 0.5 MG tablet TAKE ONE TABLET BY MOUTH EVERY 8 HOURS AS NEEDED 60 tablet 0   aspirin-acetaminophen-caffeine (EXCEDRIN MIGRAINE) 500-938-18 MG per tablet Take 2 tablets by mouth every 6 (six) hours as needed for headache.     atorvastatin (LIPITOR) 80 MG tablet Take 80 mg by mouth daily.     Blood Glucose Monitoring Suppl (ACCU-CHEK AVIVA PLUS) w/Device KIT See admin instructions.     DULoxetine (CYMBALTA) 60 MG capsule Take 60 mg by mouth daily.     furosemide (LASIX) 20 MG tablet Take 20 mg by mouth daily.     gabapentin (NEURONTIN) 300 MG capsule Take 300-600 mg by mouth See admin instructions. 300 mg twice daily, 600 mg at bedtime     Glycerin-Hypromellose-PEG 400 (DRY EYE RELIEF DROPS) 0.2-0.2-1 % SOLN Place 1 drop into both eyes daily as needed (dry eyes).     ibuprofen (ADVIL) 800 MG tablet  Take 1 tablet (800 mg total) by mouth every 8 (eight) hours as needed. Use for cramping 60 tablet 1   losartan-hydrochlorothiazide (HYZAAR) 100-12.5 MG tablet Take 1 tablet by mouth daily.     metFORMIN (GLUCOPHAGE) 1000 MG tablet Take 1,000 mg by mouth 2 (two) times daily with a meal.     nitrofurantoin, macrocrystal-monohydrate, (MACROBID) 100 MG capsule Take 1 capsule (100 mg total) by mouth 2 (two) times daily. 14 capsule 0   omeprazole (PRILOSEC) 40 MG capsule Take 40 mg by mouth daily.     TRESIBA FLEXTOUCH 100 UNIT/ML SOPN FlexTouch Pen Inject 25 Units into the skin daily.     TRULICITY 4.5 EX/9.3ZJ SOPN Inject 4.5 mg into the skin once a week.     progesterone (PROMETRIUM) 100 MG capsule Take 1 capsule (100 mg total) by mouth daily. 90 capsule 4   No current facility-administered medications for this visit.    Allergies:   Patient has no known allergies.    ROS:  Please see the history of present illness.   Otherwise, review of systems are positive none.   All other systems are reviewed and negative.    PHYSICAL EXAM: VS:  BP 112/86 (BP Location: Left Arm, Patient Position: Sitting, Cuff Size: Normal)   Pulse (!) 101   Ht '5\' 6"'$  (1.676 m)   Wt 240 lb (108.9 kg)   LMP 10/07/2012   SpO2 97%   BMI 38.74 kg/m  , BMI Body mass index is 38.74 kg/m. GENERAL:  Well appearing NECK:  No jugular venous distention, waveform within normal limits, carotid upstroke brisk and symmetric, no bruits, no thyromegaly LUNGS:  Clear to auscultation bilaterally CHEST:  Unremarkable HEART:  PMI not displaced or sustained,S1 and S2 within normal limits, no S3, no S4, no clicks, no rubs, 3 out of 6 apical systolic murmur mid peaking and radiating at aortic outflow tract, no diastolic murmurs ABD:  Flat, positive bowel sounds normal in frequency in pitch, no bruits, no rebound, no guarding, no midline pulsatile mass, no hepatomegaly, no splenomegaly EXT:  2 plus pulses throughout, no edema, no cyanosis  no clubbing   EKG:  EKG is not ordered today.  Recent Labs: 08/19/2021: BUN 16; Creatinine, Ser 0.68; Hemoglobin 13.7; Platelets 251; Potassium 4.1; Sodium 138    Lipid Panel    Component Value Date/Time   CHOL 189 06/17/2013 0946   TRIG 82 06/17/2013 0946   HDL 56 06/17/2013 0946   CHOLHDL 3.4 06/17/2013 0946   VLDL 16 06/17/2013 0946   LDLCALC 117 (H) 06/17/2013 0946  Wt Readings from Last 3 Encounters:  07/03/22 240 lb (108.9 kg)  04/09/22 267 lb 6.4 oz (121.3 kg)  03/18/22 263 lb (119.3 kg)      Other studies Reviewed: Additional studies/ records that were reviewed today include: Echo Review of the above records demonstrates: See elsewhere   ASSESSMENT AND PLAN:  AS:   This was severe.  She is short of breath and I think symptomatic with his valve demonstrated by the fact that she has not improved despite good weight loss.  I think it is time to consider replacement and she would be a candidate for SAVR.  I will schedule her for right and left heart cath.  The patient understands that risks included but are not limited to stroke (1 in 1000), death (1 in 31), kidney failure [usually temporary] (1 in 500), bleeding (1 in 200), allergic reaction [possibly serious] (1 in 200).  The patient understands and agrees to proceed.   HTN:   Blood pressure is she is target.  No change in therapy.    DYSLIPIDEMIA:   Her LDL was 122.  I will change her Lipitor to Crestor 40 mg PO daily.    DM:    The A1c was 7.4.  She is managed by her primary provider.   PALPITATIONS:    She had no significant arrhythmias on the monitor in November for 3 days.  No change in therapy.  Current medicines are reviewed at length with the patient today.  The patient does not have concerns regarding medicines.  The following changes have been made: As above  Labs/ tests ordered today include:   Orders Placed This Encounter  Procedures   Basic metabolic panel   CBC   EKG 12-Lead      Disposition:   FU with me to 2 months.     Signed, Minus Breeding, MD  07/03/2022 12:28 PM    [facility]

## 2022-07-03 ENCOUNTER — Ambulatory Visit: Payer: Medicare HMO | Attending: Cardiology | Admitting: Cardiology

## 2022-07-03 ENCOUNTER — Encounter: Payer: Self-pay | Admitting: Cardiology

## 2022-07-03 VITALS — BP 112/86 | HR 101 | Ht 66.0 in | Wt 240.0 lb

## 2022-07-03 DIAGNOSIS — R0602 Shortness of breath: Secondary | ICD-10-CM | POA: Diagnosis not present

## 2022-07-03 DIAGNOSIS — R002 Palpitations: Secondary | ICD-10-CM

## 2022-07-03 DIAGNOSIS — I350 Nonrheumatic aortic (valve) stenosis: Secondary | ICD-10-CM

## 2022-07-03 DIAGNOSIS — E785 Hyperlipidemia, unspecified: Secondary | ICD-10-CM | POA: Diagnosis not present

## 2022-07-03 DIAGNOSIS — I10 Essential (primary) hypertension: Secondary | ICD-10-CM

## 2022-07-03 DIAGNOSIS — E118 Type 2 diabetes mellitus with unspecified complications: Secondary | ICD-10-CM | POA: Diagnosis not present

## 2022-07-03 NOTE — Patient Instructions (Addendum)
Medication Instructions:  Your physician recommends that you continue on your current medications as directed. Please refer to the Current Medication list given to you today.  *If you need a refill on your cardiac medications before your next appointment, please call your pharmacy*   Lab Work: BMET & CBC today  If you have labs (blood work) drawn today and your tests are completely normal, you will receive your results only by: [place] (if you have MyChart) OR A paper copy in the mail If you have any lab test that is abnormal or we need to change your treatment, we will call you to review the results.   Testing/Procedures: Your physician has requested that you have a cardiac catheterization. Cardiac catheterization is used to diagnose and/or treat various heart conditions. Doctors may recommend this procedure for a number of different reasons. The most common reason is to evaluate chest pain. Chest pain can be a symptom of coronary artery disease (CAD), and cardiac catheterization can show whether plaque is narrowing or blocking your heart's arteries. This procedure is also used to evaluate the valves, as well as measure the blood flow and oxygen levels in different parts of your heart. For further information please visit [email or web address]. Please follow instruction sheet, as given.    Follow-Up: At [facility], you and your health needs are our priority.  As part of our continuing mission to provide you with exceptional heart care, we have created designated Provider Care Teams.  These Care Teams include your primary Cardiologist (physician) and Advanced Practice Providers (APPs -  Physician Assistants and Nurse Practitioners) who all work together to provide you with the care you need, when you need it.  We recommend signing up for the patient portal called "MyChart".  Sign up information is provided on this After Visit Summary.  MyChart is used to connect with patients  for Virtual Visits (Telemedicine).  Patients are able to view lab/test results, encounter notes, upcoming appointments, etc.  Non-urgent messages can be sent to your provider as well.   To learn more about what you can do with MyChart, go to [email or web address].    Your next appointment:   2-3 month(s)  Provider:   Minus Breeding, MD  or  APP     Other Instructions  [facility] [id number] [place] [address] Dept: [phone] Loc: [place]  07/03/2022  You are scheduled for a Cardiac Catheterization on Wednesday, January 31 with Dr. Lenna Sciara.  1. Please arrive at the [facility] (Main Entrance A) at [facility]: [address] [place], [place] [address] at 8:00 AM (This time is two hours before your procedure to ensure your preparation). Free valet parking service is available.   Special note: Every effort is made to have your procedure done on time. Please understand that emergencies sometimes delay scheduled procedures.  2. Diet: Do not eat solid foods after midnight.  The patient may have clear liquids until 5am upon the day of the procedure.  3. Labs: You will need to have blood drawn on today CBC & BMET .  4. Medication instructions in preparation for your procedure:   Contrast Allergy: No   Current Outpatient Medications (Endocrine & Metabolic):    metFORMIN (GLUCOPHAGE) 1000 MG tablet, Take 1,000 mg by mouth 2 (two) times daily with a meal.   TRESIBA FLEXTOUCH 100 UNIT/ML  SOPN FlexTouch Pen, Inject 25 Units into the skin daily.   TRULICITY 4.5 GB/1.5VV SOPN, Inject 4.5 mg into the skin once a week.   progesterone (PROMETRIUM) 100 MG capsule, Take 1 capsule (100 mg total) by mouth daily.  Current Outpatient Medications (Cardiovascular):    atorvastatin (LIPITOR) 80 MG tablet, Take 80 mg by mouth daily.    furosemide (LASIX) 20 MG tablet, Take 20 mg by mouth daily.   losartan-hydrochlorothiazide (HYZAAR) 100-12.5 MG tablet, Take 1 tablet by mouth daily.   Current Outpatient Medications (Analgesics):    allopurinol (ZYLOPRIM) 300 MG tablet, Take 300 mg by mouth daily.   aspirin-acetaminophen-caffeine (EXCEDRIN MIGRAINE) 250-250-65 MG per tablet, Take 2 tablets by mouth every 6 (six) hours as needed for headache.   ibuprofen (ADVIL) 800 MG tablet, Take 1 tablet (800 mg total) by mouth every 8 (eight) hours as needed. Use for cramping   Current Outpatient Medications (Other):    ACCU-CHEK AVIVA PLUS test strip,    ALPRAZolam (XANAX) 0.5 MG tablet, TAKE ONE TABLET BY MOUTH EVERY 8 HOURS AS NEEDED   Blood Glucose Monitoring Suppl (ACCU-CHEK AVIVA PLUS) w/Device KIT, See admin instructions.   DULoxetine (CYMBALTA) 60 MG capsule, Take 60 mg by mouth daily.   gabapentin (NEURONTIN) 300 MG capsule, Take 300-600 mg by mouth See admin instructions. 300 mg twice daily, 600 mg at bedtime   Glycerin-Hypromellose-PEG 400 (DRY EYE RELIEF DROPS) 0.2-0.2-1 % SOLN, Place 1 drop into both eyes daily as needed (dry eyes).   nitrofurantoin, macrocrystal-monohydrate, (MACROBID) 100 MG capsule, Take 1 capsule (100 mg total) by mouth 2 (two) times daily.   omeprazole (PRILOSEC) 40 MG capsule, Take 40 mg by mouth daily. *For reference purposes while preparing patient instructions.   Delete this med list prior to printing instructions for patient.*    Do not take Diabetes Med Metformin & morning dose of insulin on the day of the procedure and HOLD 48 HOURS AFTER THE PROCEDURE.  On the morning of your procedure, take your Aspirin 81 mg and any morning medicines NOT listed above.  You may use sips of water.  5. Plan for one night stay--bring personal belongings. 6. Bring a current list of your medications and current insurance cards. 7. You MUST have a responsible person to drive you home. 8. Someone MUST be with  you the first 24 hours after you arrive home or your discharge will be delayed. 9. Please wear clothes that are easy to get on and off and wear slip-on shoes.  Thank you for allowing [place] to care for you!   -- [facility]

## 2022-07-04 LAB — BASIC METABOLIC PANEL
BUN/Creatinine Ratio: 15 (ref 12–28)
BUN: 16 mg/dL (ref 8–27)
CO2: 22 mmol/L (ref 20–29)
Calcium: 10.2 mg/dL (ref 8.7–10.3)
Chloride: 102 mmol/L (ref 96–106)
Creatinine, Ser: 1.04 mg/dL — ABNORMAL HIGH (ref 0.57–1.00)
Glucose: 96 mg/dL (ref 70–99)
Potassium: 5.5 mmol/L — ABNORMAL HIGH (ref 3.5–5.2)
Sodium: 142 mmol/L (ref 134–144)
eGFR: 62 mL/min/{1.73_m2} (ref >59)

## 2022-07-04 LAB — CBC
Hematocrit: 46.3 % (ref 34.0–46.6)
Hemoglobin: 15.5 g/dL (ref 11.1–15.9)
MCH: 30.3 pg (ref 26.6–33.0)
MCHC: 33.5 g/dL (ref 31.5–35.7)
MCV: 91 fL (ref 79–97)
Platelets: 309 10*3/uL (ref 150–450)
RBC: 5.11 x10E6/uL (ref 3.77–5.28)
RDW: 12.9 % (ref 11.7–15.4)
WBC: 13.1 10*3/uL — ABNORMAL HIGH (ref 3.4–10.8)

## 2022-07-08 ENCOUNTER — Telehealth: Payer: Self-pay | Admitting: *Deleted

## 2022-07-08 ENCOUNTER — Encounter: Payer: Self-pay | Admitting: *Deleted

## 2022-07-08 NOTE — Telephone Encounter (Signed)
Cardiac Catheterization scheduled at [facility] for Wednesday July 09, 2022 10 AM Arrival time and place: [facility] Main Entrance A at: 8 AM  Nothing to eat after midnight prior to procedure, clear liquids until 5 AM day of procedure.  Medication instructions: -Hold:  Metformin-day of procedure and 48 hours post procedure  Tresiba-AM of procedure  Trulicity-weekly on Wednesdays-will hold until post procedure Lasix/Losartan-HCT AM of procedure -Except hold medications usual morning medications can be taken with sips of water including aspirin 81 mg.  Confirmed patient has responsible adult to drive home post procedure and be with patient first 24 hours after arriving home.  Patient reports no new symptoms concerning for COVID-19 in the past 10 days. 07/03/22 WBC 13.1 -pt denies fever, no signs /symptoms of infection. 07/03/22 K 5.5-pt instructed to avoid foods high in potassium, confirmed she does not take any potassium supplements.  Reviewed procedure instructions with patient.

## 2022-07-09 ENCOUNTER — Encounter (HOSPITAL_COMMUNITY): Payer: Self-pay | Admitting: Internal Medicine

## 2022-07-09 ENCOUNTER — Ambulatory Visit (HOSPITAL_COMMUNITY)
Admission: RE | Admit: 2022-07-09 | Discharge: 2022-07-09 | Disposition: A | Discharge status: Home / Self Care | Payer: Medicare HMO | Attending: Internal Medicine | Admitting: Internal Medicine

## 2022-07-09 ENCOUNTER — Other Ambulatory Visit: Payer: Self-pay

## 2022-07-09 ENCOUNTER — Encounter (HOSPITAL_COMMUNITY)
Admission: RE | Disposition: A | Discharge status: Home / Self Care | Payer: Self-pay | Source: Home / Self Care | Attending: Internal Medicine

## 2022-07-09 DIAGNOSIS — I10 Essential (primary) hypertension: Secondary | ICD-10-CM | POA: Insufficient documentation

## 2022-07-09 DIAGNOSIS — Z794 Long term (current) use of insulin: Secondary | ICD-10-CM | POA: Diagnosis not present

## 2022-07-09 DIAGNOSIS — Z7985 Long-term (current) use of injectable non-insulin antidiabetic drugs: Secondary | ICD-10-CM | POA: Diagnosis not present

## 2022-07-09 DIAGNOSIS — Z7984 Long term (current) use of oral hypoglycemic drugs: Secondary | ICD-10-CM | POA: Diagnosis not present

## 2022-07-09 DIAGNOSIS — Z79899 Other long term (current) drug therapy: Secondary | ICD-10-CM | POA: Diagnosis not present

## 2022-07-09 DIAGNOSIS — I350 Nonrheumatic aortic (valve) stenosis: Secondary | ICD-10-CM | POA: Diagnosis not present

## 2022-07-09 DIAGNOSIS — Q231 Congenital insufficiency of aortic valve: Secondary | ICD-10-CM

## 2022-07-09 DIAGNOSIS — E785 Hyperlipidemia, unspecified: Secondary | ICD-10-CM | POA: Diagnosis not present

## 2022-07-09 DIAGNOSIS — Q230 Congenital stenosis of aortic valve: Secondary | ICD-10-CM

## 2022-07-09 DIAGNOSIS — E119 Type 2 diabetes mellitus without complications: Secondary | ICD-10-CM | POA: Diagnosis not present

## 2022-07-09 DIAGNOSIS — R002 Palpitations: Secondary | ICD-10-CM | POA: Insufficient documentation

## 2022-07-09 HISTORY — PX: RIGHT HEART CATH: CATH118263

## 2022-07-09 HISTORY — PX: LEFT HEART CATH AND CORONARY ANGIOGRAPHY: CATH118249

## 2022-07-09 LAB — POCT I-STAT EG7
Acid-Base Excess: 0 mmol/L (ref 0.0–2.0)
Acid-base deficit: 1 mmol/L (ref 0.0–2.0)
Bicarbonate: 24.8 mmol/L (ref 20.0–28.0)
Bicarbonate: 24.9 mmol/L (ref 20.0–28.0)
Calcium, Ion: 1.22 mmol/L (ref 1.15–1.40)
Calcium, Ion: 1.26 mmol/L (ref 1.15–1.40)
HCT: 39 % (ref 36.0–46.0)
HCT: 39 % (ref 36.0–46.0)
Hemoglobin: 13.3 g/dL (ref 12.0–15.0)
Hemoglobin: 13.3 g/dL (ref 12.0–15.0)
O2 Saturation: 74 %
O2 Saturation: 75 %
Potassium: 3.8 mmol/L (ref 3.5–5.1)
Potassium: 3.9 mmol/L (ref 3.5–5.1)
Sodium: 141 mmol/L (ref 135–145)
Sodium: 141 mmol/L (ref 135–145)
TCO2: 26 mmol/L (ref 22–32)
TCO2: 26 mmol/L (ref 22–32)
pCO2, Ven: 42.9 mmHg — ABNORMAL LOW (ref 44–60)
pCO2, Ven: 45.1 mmHg (ref 44–60)
pH, Ven: 7.347 (ref 7.25–7.43)
pH, Ven: 7.373 (ref 7.25–7.43)
pO2, Ven: 41 mmHg (ref 32–45)
pO2, Ven: 42 mmHg (ref 32–45)

## 2022-07-09 LAB — POCT I-STAT 7, (LYTES, BLD GAS, ICA,H+H)
Acid-base deficit: 2 mmol/L (ref 0.0–2.0)
Bicarbonate: 23 mmol/L (ref 20.0–28.0)
Calcium, Ion: 1.22 mmol/L (ref 1.15–1.40)
HCT: 39 % (ref 36.0–46.0)
Hemoglobin: 13.3 g/dL (ref 12.0–15.0)
O2 Saturation: 94 %
Potassium: 3.9 mmol/L (ref 3.5–5.1)
Sodium: 141 mmol/L (ref 135–145)
TCO2: 24 mmol/L (ref 22–32)
pCO2 arterial: 38.9 mmHg (ref 32–48)
pH, Arterial: 7.381 (ref 7.35–7.45)
pO2, Arterial: 70 mmHg — ABNORMAL LOW (ref 83–108)

## 2022-07-09 LAB — BASIC METABOLIC PANEL
Anion gap: 14 (ref 5–15)
BUN: 15 mg/dL (ref 6–20)
CO2: 24 mmol/L (ref 22–32)
Calcium: 9.4 mg/dL (ref 8.9–10.3)
Chloride: 101 mmol/L (ref 98–111)
Creatinine, Ser: 0.87 mg/dL (ref 0.44–1.00)
GFR, Estimated: >60 mL/min (ref >60)
Glucose, Bld: 123 mg/dL — ABNORMAL HIGH (ref 70–99)
Potassium: 4 mmol/L (ref 3.5–5.1)
Sodium: 139 mmol/L (ref 135–145)

## 2022-07-09 LAB — GLUCOSE, CAPILLARY: Glucose-Capillary: 123 mg/dL — ABNORMAL HIGH (ref 70–99)

## 2022-07-09 SURGERY — RIGHT HEART CATH
Anesthesia: LOCAL

## 2022-07-09 MED ORDER — HEPARIN SODIUM (PORCINE) 1000 UNIT/ML IJ SOLN
INTRAMUSCULAR | Status: AC
  Filled 2022-07-09: qty 10

## 2022-07-09 MED ORDER — VERAPAMIL HCL 2.5 MG/ML IV SOLN
INTRAVENOUS | Status: AC
  Filled 2022-07-09: qty 2

## 2022-07-09 MED ORDER — VERAPAMIL HCL 2.5 MG/ML IV SOLN
INTRAVENOUS | Status: DC | PRN
  Administered 2022-07-09: 10 mL via INTRA_ARTERIAL

## 2022-07-09 MED ORDER — HYDRALAZINE HCL 20 MG/ML IJ SOLN: 10.0000 mg | INTRAMUSCULAR | Status: DC | PRN

## 2022-07-09 MED ORDER — LIDOCAINE HCL (PF) 1 % IJ SOLN
INTRAMUSCULAR | Status: DC | PRN
  Administered 2022-07-09: 2 mL
  Administered 2022-07-09: 5 mL

## 2022-07-09 MED ORDER — SODIUM CHLORIDE 0.9% FLUSH: 3.0000 mL | Freq: Two times a day (BID) | INTRAVENOUS | Status: DC

## 2022-07-09 MED ORDER — HEPARIN (PORCINE) IN NACL 1000-0.9 UT/500ML-% IV SOLN
INTRAVENOUS | Status: AC
  Filled 2022-07-09: qty 1000

## 2022-07-09 MED ORDER — ONDANSETRON HCL 4 MG/2ML IJ SOLN: 4.0000 mg | Freq: Four times a day (QID) | INTRAMUSCULAR | Status: DC | PRN

## 2022-07-09 MED ORDER — ASPIRIN 81 MG PO CHEW: 81.0000 mg | CHEWABLE_TABLET | ORAL | Status: DC

## 2022-07-09 MED ORDER — MIDAZOLAM HCL 2 MG/2ML IJ SOLN
INTRAMUSCULAR | Status: DC | PRN
  Administered 2022-07-09: 1 mg via INTRAVENOUS

## 2022-07-09 MED ORDER — LIDOCAINE HCL (PF) 1 % IJ SOLN
INTRAMUSCULAR | Status: AC
  Filled 2022-07-09: qty 30

## 2022-07-09 MED ORDER — FENTANYL CITRATE (PF) 100 MCG/2ML IJ SOLN
INTRAMUSCULAR | Status: DC | PRN
  Administered 2022-07-09: 25 ug via INTRAVENOUS

## 2022-07-09 MED ORDER — HEPARIN SODIUM (PORCINE) 1000 UNIT/ML IJ SOLN
INTRAMUSCULAR | Status: DC | PRN
  Administered 2022-07-09: 5000 [IU] via INTRA_ARTERIAL

## 2022-07-09 MED ORDER — FENTANYL CITRATE (PF) 100 MCG/2ML IJ SOLN
INTRAMUSCULAR | Status: AC
  Filled 2022-07-09: qty 2

## 2022-07-09 MED ORDER — SODIUM CHLORIDE 0.9% FLUSH: 3.0000 mL | INTRAVENOUS | Status: DC | PRN

## 2022-07-09 MED ORDER — ACETAMINOPHEN 325 MG PO TABS: 650.0000 mg | ORAL_TABLET | ORAL | Status: DC | PRN

## 2022-07-09 MED ORDER — SODIUM CHLORIDE 0.9 % WEIGHT BASED INFUSION: 1.0000 mL/kg/h | INTRAVENOUS | Status: DC

## 2022-07-09 MED ORDER — MIDAZOLAM HCL 2 MG/2ML IJ SOLN
INTRAMUSCULAR | Status: AC
  Filled 2022-07-09: qty 2

## 2022-07-09 MED ORDER — HEPARIN (PORCINE) IN NACL 1000-0.9 UT/500ML-% IV SOLN
INTRAVENOUS | Status: DC | PRN
  Administered 2022-07-09 (×2): 500 mL

## 2022-07-09 MED ORDER — SODIUM CHLORIDE 0.9 % IV SOLN: INTRAVENOUS | Status: DC

## 2022-07-09 MED ORDER — LABETALOL HCL 5 MG/ML IV SOLN: 10.0000 mg | INTRAVENOUS | Status: DC | PRN

## 2022-07-09 MED ORDER — SODIUM CHLORIDE 0.9 % IV SOLN: 250.0000 mL | INTRAVENOUS | Status: DC | PRN

## 2022-07-09 MED ORDER — IOHEXOL 350 MG/ML SOLN
INTRAVENOUS | Status: DC | PRN
  Administered 2022-07-09: 30 mL via INTRA_ARTERIAL

## 2022-07-09 MED ORDER — SODIUM CHLORIDE 0.9 % WEIGHT BASED INFUSION
3.0000 mL/kg/h | INTRAVENOUS | Status: AC
  Administered 2022-07-09: 3 mL/kg/h via INTRAVENOUS

## 2022-07-09 SURGICAL SUPPLY — 12 items
CATH BALLN WEDGE 5F 110CM (CATHETERS) IMPLANT
CATH OPTITORQUE TIG 4.0 6F (CATHETERS) IMPLANT
DEVICE RAD COMP TR BAND LRG (VASCULAR PRODUCTS) IMPLANT
GLIDESHEATH SLEND SS 6F .021 (SHEATH) IMPLANT
KIT HEART LEFT (KITS) ×2 IMPLANT
PACK CARDIAC CATHETERIZATION (CUSTOM PROCEDURE TRAY) ×2 IMPLANT
PROTECTION STATION PRESSURIZED (MISCELLANEOUS) ×1
SHEATH GLIDE SLENDER 4/5FR (SHEATH) IMPLANT
STATION PROTECTION PRESSURIZED (MISCELLANEOUS) IMPLANT
TRANSDUCER W/STOPCOCK (MISCELLANEOUS) ×2 IMPLANT
TUBING CIL FLEX 10 FLL-RA (TUBING) ×2 IMPLANT
WIRE EMERALD 3MM-J .035X260CM (WIRE) IMPLANT

## 2022-07-09 NOTE — Progress Notes (Signed)
Patient and friend was given discharge instructions. Both verbalized understanding. 

## 2022-07-09 NOTE — Discharge Instructions (Addendum)
Restart metformin on 07/11/22.   Drink plenty of fluid for 48 hours and keep wrist elevated at heart level for 24 hours  Radial Site Care   This sheet gives you information about how to care for yourself after your procedure. Your health care provider may also give you more specific instructions. If you have problems or questions, contact your health care provider. What can I expect after the procedure? After the procedure, it is common to have: Bruising and tenderness at the catheter insertion area. Follow these instructions at home: Medicines Take over-the-counter and prescription medicines only as told by your health care provider. Insertion site care Follow instructions from your health care provider about how to take care of your insertion site. Make sure you: Wash your hands with soap and water before you change your bandage (dressing). If soap and water are not available, use hand sanitizer. remove your dressing as told by your health care provider. In 24 hours Check your insertion site every day for signs of infection. Check for: Redness, swelling, or pain. Fluid or blood. Pus or a bad smell. Warmth. Do not take baths, swim, or use a hot tub until your health care provider approves. You may shower 24-48 hours after the procedure, or as directed by your health care provider. Remove the dressing and gently wash the site with plain soap and water. Pat the area dry with a clean towel. Do not rub the site. That could cause bleeding. Do not apply powder or lotion to the site. Activity   For 24 hours after the procedure, or as directed by your health care provider: Do not flex or bend the affected arm. Do not push or pull heavy objects with the affected arm. Do not drive yourself home from the hospital or clinic. You may drive 24 hours after the procedure unless your health care provider tells you not to. Do not operate machinery or power tools. Do not lift anything that is  heavier than 10 lb (4.5 kg), or the limit that you are told, until your health care provider says that it is safe. For 4 days Ask your health care provider when it is okay to: Return to work or school. Resume usual physical activities or sports. Resume sexual activity. General instructions If the catheter site starts to bleed, raise your arm and put firm pressure on the site. If the bleeding does not stop, get help right away. This is a medical emergency. If you went home on the same day as your procedure, a responsible adult should be with you for the first 24 hours after you arrive home. Keep all follow-up visits as told by your health care provider. This is important. Contact a health care provider if: You have a fever. You have redness, swelling, or yellow drainage around your insertion site. Get help right away if: You have unusual pain at the radial site. The catheter insertion area swells very fast. The insertion area is bleeding, and the bleeding does not stop when you hold steady pressure on the area. Your arm or hand becomes pale, cool, tingly, or numb. These symptoms may represent a serious problem that is an emergency. Do not wait to see if the symptoms will go away. Get medical help right away. Call your local emergency services (911 in the [place] Do not drive yourself to the hospital. Summary After the procedure, it is common to have bruising and tenderness at the site. Follow instructions from your health care provider about how  to take care of your radial site wound. Check the wound every day for signs of infection. Do not lift anything that is heavier than 10 lb (4.5 kg), or the limit that you are told, until your health care provider says that it is safe. This information is not intended to replace advice given to you by your health care provider. Make sure you discuss any questions you have with your health care provider. Document Revised: 07/01/2017 Document Reviewed:  07/01/2017 Elsevier Patient Education  2020 [employer].

## 2022-07-09 NOTE — Interval H&P Note (Signed)
History and Physical Interval Note:  07/09/2022 8:06 AM  Nancy Oliver  has presented today for surgery, with the diagnosis of sortic stenosis.  The various methods of treatment have been discussed with the patient and family. After consideration of risks, benefits and other options for treatment, the patient has consented to  Procedure(s): RIGHT/LEFT HEART CATH AND CORONARY ANGIOGRAPHY (N/A) as a surgical intervention.  The patient's history has been reviewed, patient examined, no change in status, stable for surgery.  I have reviewed the patient's chart and labs.  Questions were answered to the patient's satisfaction.     Early Osmond

## 2022-07-16 ENCOUNTER — Other Ambulatory Visit: Payer: Self-pay | Admitting: Surgery

## 2022-07-16 ENCOUNTER — Institutional Professional Consult (permissible substitution): Payer: Medicare HMO | Admitting: Surgery

## 2022-07-16 ENCOUNTER — Encounter: Payer: Self-pay | Admitting: *Deleted

## 2022-07-16 ENCOUNTER — Other Ambulatory Visit: Payer: Self-pay | Admitting: *Deleted

## 2022-07-16 ENCOUNTER — Encounter: Payer: Self-pay | Admitting: Surgery

## 2022-07-16 VITALS — BP 150/98 | HR 72 | Resp 20 | Ht 66.0 in | Wt 240.0 lb

## 2022-07-16 DIAGNOSIS — I350 Nonrheumatic aortic (valve) stenosis: Secondary | ICD-10-CM

## 2022-07-16 NOTE — Progress Notes (Signed)
Cardiothoracic Surgery Consulatiton   PCP is [facility], [place], [place] Referring Provider is Early Osmond, MD  Chief Complaint  Patient presents with   Aortic Stenosis    Surgical consult    HPI:  The patient is a 62 year old woman with a history of morbid obesity, diabetes, hyperlipidemia, hypertension, and aortic stenosis who was referred for consideration of aortic valve replacement.  She reports a 6-12 month history of progressive exertional fatigue and shortness of breath as well as episodes of dizziness when bending over and bilateral lower extremity edema.  Her most recent echocardiogram on 04/29/2022 showed a bicuspid aortic valve with a mean gradient of 38 mmHg and a peak gradient of 68 mmHg.  Aortic valve area by VTI was 1.22 cm.  The aortic valve leaflets were calcified and thickened with poor leaflet mobility.  Left ventricular ejection fraction was 60 to 65% with grade 1 diastolic dysfunction.  There was no mitral regurgitation.  Cardiac catheterization showed a normal left dominant circulation with normal filling pressures and a cardiac index of 3.3.  She said that she has been trying to lose weight and has lost about 70 pounds but has not noted any improvement in her symptoms.  She is here today with her daughter. Past Medical History:  Diagnosis Date   Anxiety    Arthritis    Depression    Diabetes mellitus without complication ([place])    Heart murmur    Hyperlipidemia    Hypertension    Migraines    Neuropathy    Obesity    Right sided sciatica     Past Surgical History:  Procedure Laterality Date   BREAST SURGERY Left 04/2014   biopsy    CESAREAN SECTION     HYSTEROSCOPY WITH D & C N/A 08/20/2021   Procedure: DILATATION AND CURETTAGE /HYSTEROSCOPY;  Surgeon: Janyth Pupa, DO;  Location: [facility];  Service: Gynecology;  Laterality: N/A;   KNEE SURGERY     LEFT HEART CATH AND CORONARY ANGIOGRAPHY N/A 07/09/2022   Procedure: LEFT HEART CATH AND CORONARY  ANGIOGRAPHY;  Surgeon: Early Osmond, MD;  Location: [facility];  Service: Cardiovascular;  Laterality: N/A;   NASAL SEPTOPLASTY W/ TURBINOPLASTY     POLYPECTOMY N/A 08/20/2021   Procedure: POLYPECTOMY(WITH MYOSURE DEVICE);  Surgeon: Janyth Pupa, DO;  Location: [facility];  Service: Gynecology;  Laterality: N/A;   RIGHT HEART CATH N/A 07/09/2022   Procedure: RIGHT HEART CATH;  Surgeon: Early Osmond, MD;  Location: [facility];  Service: Cardiovascular;  Laterality: N/A;    Family History  Problem Relation Age of Onset   COPD Father    Muscular dystrophy Sister    Lupus Son     Social History Social History   Tobacco Use   Smoking status: Never   Smokeless tobacco: Never  Vaping Use   Vaping Use: Never used  Substance Use Topics   Alcohol use: No   Drug use: No    Current Outpatient Medications  Medication Sig Dispense Refill   ACCU-CHEK AVIVA PLUS test strip      allopurinol (ZYLOPRIM) 300 MG tablet Take 300 mg by mouth daily.     ALPRAZolam (XANAX) 0.5 MG tablet TAKE ONE TABLET BY MOUTH EVERY 8 HOURS AS NEEDED 60 tablet 0   aspirin-acetaminophen-caffeine (EXCEDRIN MIGRAINE) 332-951-88 MG per tablet Take 2 tablets by mouth every 6 (six) hours as needed for headache.     atorvastatin (LIPITOR) 80 MG tablet Take 80 mg  by mouth daily.     Blood Glucose Monitoring Suppl (ACCU-CHEK AVIVA PLUS) w/Device KIT See admin instructions.     DULoxetine (CYMBALTA) 60 MG capsule Take 60 mg by mouth daily.     furosemide (LASIX) 20 MG tablet Take 20 mg by mouth daily.     gabapentin (NEURONTIN) 300 MG capsule Take 300-600 mg by mouth See admin instructions. 300 mg twice daily, 600 mg at bedtime     Glycerin-Hypromellose-PEG 400 (DRY EYE RELIEF DROPS) 0.2-0.2-1 % SOLN Place 1 drop into both eyes daily as needed (dry eyes).     ibuprofen (ADVIL) 800 MG tablet Take 1 tablet (800 mg total) by mouth every 8 (eight) hours as needed. Use for cramping 60 tablet 1    losartan-hydrochlorothiazide (HYZAAR) 100-12.5 MG tablet Take 1 tablet by mouth daily.     metFORMIN (GLUCOPHAGE) 1000 MG tablet Take 1,000 mg by mouth 2 (two) times daily with a meal.     nitrofurantoin, macrocrystal-monohydrate, (MACROBID) 100 MG capsule Take 1 capsule (100 mg total) by mouth 2 (two) times daily. 14 capsule 0   omeprazole (PRILOSEC) 40 MG capsule Take 40 mg by mouth daily.     progesterone (PROMETRIUM) 100 MG capsule Take 1 capsule (100 mg total) by mouth daily. 90 capsule 4   TRESIBA FLEXTOUCH 100 UNIT/ML SOPN FlexTouch Pen Inject 25 Units into the skin daily.     TRULICITY 4.5 ZH/2.9JM SOPN Inject 4.5 mg into the skin once a week.     No current facility-administered medications for this visit.    No Known Allergies  Review of Systems  Constitutional:  Positive for fatigue.  HENT: Negative.  Negative for dental problem.   Eyes: Negative.   Respiratory:  Positive for shortness of breath.   Cardiovascular:  Positive for leg swelling. Negative for chest pain.  Gastrointestinal: Negative.   Endocrine: Negative.   Genitourinary: Negative.   Musculoskeletal: Negative.   Skin: Negative.   Allergic/Immunologic: Negative.   Neurological:  Positive for dizziness. Negative for syncope.  Hematological: Negative.   Psychiatric/Behavioral:  The patient is nervous/anxious.     Ht '5\' 6"'$  (1.676 m)   Wt 240 lb (108.9 kg)   LMP 10/07/2012   BMI 38.74 kg/m  Physical Exam Constitutional:      Appearance: Normal appearance. She is obese.  HENT:     Head: Normocephalic and atraumatic.  Eyes:     Extraocular Movements: Extraocular movements intact.     Conjunctiva/sclera: Conjunctivae normal.     Pupils: Pupils are equal, round, and reactive to light.  Neck:     Vascular: No carotid bruit.  Cardiovascular:     Rate and Rhythm: Normal rate and regular rhythm.     Pulses: Normal pulses.     Heart sounds: Murmur heard.     Comments: 3/6 systolic murmur along the right  sternal border.  There is no diastolic murmur. Pulmonary:     Effort: Pulmonary effort is normal.     Breath sounds: Normal breath sounds.  Abdominal:     General: Bowel sounds are normal. There is no distension.     Palpations: Abdomen is soft.     Tenderness: There is no abdominal tenderness.  Musculoskeletal:        General: Swelling present.  Skin:    General: Skin is warm and dry.  Neurological:     General: No focal deficit present.     Mental Status: She is alert and oriented to person, place, and time.  Psychiatric:        Mood and Affect: Mood normal.        Behavior: Behavior normal.     Diagnostic Tests:    ECHOCARDIOGRAM REPORT       Patient Name:   Nancy Oliver Date of Exam: 04/29/2022  Medical Rec #:  [id number]       Height:       66.0 in  Accession #:    [id number]      Weight:       267.4 lb  Date of Birth:  [birth year]       BSA:          2.263 m  Patient Age:    11 years        BP:           142/86 mmHg  Patient Gender: F               HR:           82 bpm.  Exam Location:  [address]   Procedure: 2D Echo, Cardiac Doppler, Color Doppler and Strain Analysis   Indications:    I35 Aortic stenosis    History:        Patient has prior history of Echocardiogram examinations,  most                 recent 10/08/2021. Aortic Valve Disease,  Signs/Symptoms:Shortness                 of Breath; Risk Factors:Hypertension, Diabetes and  Dyslipidemia.    Sonographer:   Basilia Jumbo BS, RDCS  Referring Phys: [employer]     1. Bicuspid aortic valve with severe AS (mean gradient 38 mmHg; peak  velocity of 4.1 m/s); LVOT gradient of 3.2 m/s with valsalva.   2. Left ventricular ejection fraction, by estimation, is 60 to 65%. The  left ventricle has normal function. The left ventricle has no regional  wall motion abnormalities. There is mild left ventricular hypertrophy.  Left ventricular diastolic parameters  are consistent with Grade I  diastolic dysfunction (impaired relaxation).  The average left ventricular global longitudinal strain is -20.4 %. The  global longitudinal strain is normal.   3. Right ventricular systolic function is normal. The right ventricular  size is normal.   4. The mitral valve is normal in structure. No evidence of mitral valve  regurgitation. No evidence of mitral stenosis. Moderate mitral annular  calcification.   5. The aortic valve is bicuspid. Aortic valve regurgitation is trivial.  Severe aortic valve stenosis.   6. The inferior vena cava is normal in size with greater than 50%  respiratory variability, suggesting right atrial pressure of 3 mmHg.   Comparison(s): 10/08/21 EF 60-65%. Moderate-severe AS 74mHg mean PG, 461mg  peak PG.   FINDINGS   Left Ventricle: Left ventricular ejection fraction, by estimation, is 60  to 65%. The left ventricle has normal function. The left ventricle has no  regional wall motion abnormalities. The average left ventricular global  longitudinal strain is -20.4 %.  The global longitudinal strain is normal. The left ventricular internal  cavity size was normal in size. There is mild left ventricular  hypertrophy. Left ventricular diastolic parameters are consistent with  Grade I diastolic dysfunction (impaired  relaxation).   Right Ventricle: The right ventricular size is normal. Right ventricular  systolic function is normal.   Left Atrium: Left atrial size was normal in  size.   Right Atrium: Right atrial size was normal in size.   Pericardium: There is no evidence of pericardial effusion.   Mitral Valve: The mitral valve is normal in structure. Moderate mitral  annular calcification. No evidence of mitral valve regurgitation. No  evidence of mitral valve stenosis.   Tricuspid Valve: The tricuspid valve is normal in structure. Tricuspid  valve regurgitation is trivial. No evidence of tricuspid stenosis.   Aortic Valve: The aortic valve is  bicuspid. Aortic valve regurgitation is  trivial. Severe aortic stenosis is present. Aortic valve mean gradient  measures 38.0 mmHg. Aortic valve peak gradient measures 67.6 mmHg. Aortic  valve area, by VTI measures 1.22  cm.   Pulmonic Valve: The pulmonic valve was normal in structure. Pulmonic valve  regurgitation is not visualized. No evidence of pulmonic stenosis.   Aorta: The aortic root is normal in size and structure.   Venous: The inferior vena cava is normal in size with greater than 50%  respiratory variability, suggesting right atrial pressure of 3 mmHg.   IAS/Shunts: No atrial level shunt detected by color flow Doppler.   Additional Comments: Bicuspid aortic valve with severe AS (mean gradient  38 mmHg; peak velocity of 4.1 m/s); LVOT gradient of 3.2 m/s with  valsalva.     LEFT VENTRICLE  PLAX 2D  LVIDd:         3.80 cm   Diastology  LVIDs:         2.60 cm   LV e' medial:    5.03 cm/s  LV PW:         1.20 cm   LV E/e' medial:  12.8  LV IVS:        1.10 cm   LV e' lateral:   8.48 cm/s  LVOT diam:     2.30 cm   LV E/e' lateral: 7.6  LV SV:         83  LV SV Index:   37        2D Longitudinal Strain  LVOT Area:     4.15 cm  2D Strain GLS (A2C):   -19.3 %                           2D Strain GLS (A3C):   -19.1 %                           2D Strain GLS (A4C):   -22.8 %                           2D Strain GLS Avg:     -20.4 %                             3D Volume EF:                           3D EF:        61 %                           LV EDV:       118 ml  LV ESV:       46 ml                           LV SV:        71 ml   RIGHT VENTRICLE             IVC  RV Basal diam:  3.60 cm     IVC diam: 1.90 cm  RV S prime:     10.60 cm/s  TAPSE (M-mode): 2.0 cm   LEFT ATRIUM             Index        RIGHT ATRIUM           Index  LA diam:        3.40 cm 1.50 cm/m   RA Pressure: 3.00 mmHg  LA Vol (A2C):   29.4 ml 12.99 ml/m  RA Area:     14.10  cm  LA Vol (A4C):   29.8 ml 13.17 ml/m  RA Volume:   34.40 ml  15.20 ml/m  LA Biplane Vol: 29.7 ml 13.13 ml/m   AORTIC VALVE  AV Area (Vmax):    0.95 cm  AV Area (Vmean):   0.97 cm  AV Area (VTI):     1.22 cm  AV Vmax:           411.00 cm/s  AV Vmean:          277.000 cm/s  AV VTI:            0.680 m  AV Peak Grad:      67.6 mmHg  AV Mean Grad:      38.0 mmHg  LVOT Vmax:         94.30 cm/s  LVOT Vmean:        64.500 cm/s  LVOT VTI:          0.199 m  LVOT/AV VTI ratio: 0.29    AORTA  Ao Root diam: 3.60 cm  Ao Asc diam:  3.60 cm   MITRAL VALVE                TRICUSPID VALVE                              Estimated RAP:  3.00 mmHg  MV Decel Time: 292 msec  MV E velocity: 64.50 cm/s   SHUNTS  MV A velocity: 110.00 cm/s  Systemic VTI:  0.20 m  MV E/A ratio:  0.59         Systemic Diam: 2.30 cm   Kirk Ruths MD  Electronically signed by Kirk Ruths MD  Signature Date/Time: 04/29/2022/1:13:29 PM        Final      Physicians  Panel Physicians Referring Physician Case Authorizing Physician  Early Osmond, MD (Primary)     Procedures  LEFT HEART CATH AND CORONARY ANGIOGRAPHY  RIGHT HEART CATH   Conclusion  1.  Normal left dominant circulation. 2.  Fick cardiac output of 7.2 L/min, Fick cardiac index of 3.3 L/min/m with mean RA pressure of 5 mmHg, RV pressure of 30/1 with an RV end-diastolic pressure of 8 mmHg, mean wedge pressure of 5 mmHg, and mean PA pressure of 16 mmHg.   Recommendation: Cardiothoracic surgical evaluation.   Indications  Aortic stenosis due to bicuspid aortic valve [Q23.0, Q23.1 (ICD-10-CM)]   Clinical Presentation  CHF/Shock Congestive heart failure not present. No shock present.   Procedural Details  Technical Details The patient is a 62 year old female with a history of type 2 diabetes and hyperlipidemia who has developed severe symptomatic bicuspid aortic stenosis.  She is referred for preprocedural assessment consisting of  a right heart catheterization and coronary angiography study.  After obtaining consent the patient brought to the cardiac catheterization laboratory and prepped draped sterile fashion.  A previous placed antecubital IV was exchanged for 6 French humeral glide sheath.  Xylocaine was used to anesthetize the right wrist and a 6 [place] normal glide sheath was placed.  5000 units heparin and 5 mg of verapamil were administered through the sheath.  A 6 [place] Tig catheter was used for coronary angiography and a 5 [place] balloontipped catheter was used for right heart catheterization.  A TR band was placed and manual pressure applied to the antecubital site.  There are no acute complications. Estimated blood loss <50 mL.   During this procedure medications were administered to achieve and maintain moderate conscious sedation while the patient's heart rate, blood pressure, and oxygen saturation were continuously monitored and I was present face-to-face 100% of this time.   Medications (Filter: Administrations occurring from [phone] to 1009 on 07/09/22) Heparin (Porcine) in NaCl 1000-0.9 UT/500ML-% SOLN (mL)  Total volume: 1,000 mL Date/Time Rate/Dose/Volume Action   07/09/22 0927 500 mL Given   0927 500 mL Given   fentaNYL (SUBLIMAZE) injection (mcg)  Total dose: 25 mcg Date/Time Rate/Dose/Volume Action   07/09/22 0944 25 mcg Given   midazolam (VERSED) injection (mg)  Total dose: 1 mg Date/Time Rate/Dose/Volume Action   07/09/22 0944 1 mg Given   lidocaine (PF) (XYLOCAINE) 1 % injection (mL)  Total volume: 7 mL Date/Time Rate/Dose/Volume Action   07/09/22 0945 5 mL Given   0945 2 mL Given   Radial Cocktail/Verapamil only (mL)  Total volume: 10 mL Date/Time Rate/Dose/Volume Action   07/09/22 0948 10 mL Given   heparin sodium (porcine) injection (Units)  Total dose: 5,000 Units Date/Time Rate/Dose/Volume Action   07/09/22 0948 5,000 Units Given   iohexol (OMNIPAQUE) 350 MG/ML injection  (mL)  Total volume: 30 mL Date/Time Rate/Dose/Volume Action   07/09/22 1003 30 mL Given    Sedation Time  Sedation Time Physician-1: 16 minutes 48 seconds Contrast  Medication Name Total Dose  iohexol (OMNIPAQUE) 350 MG/ML injection 30 mL   Radiation/Fluoro  Fluoro time: 3 (min) DAP: 10137 (mGycm2) Cumulative Air Kerma: 440 (mGy) Complications  Complications documented before study signed (07/09/2022 34:74 AM)   No complications were associated with this study.  Documented by Early Osmond, MD - 07/09/2022 10:07 AM     Coronary Findings  Diagnostic Dominance: Left No diagnostic findings have been documented. Intervention  No interventions have been documented. Coronary Diagrams  Diagnostic Dominance: Left  Intervention   Implants   No implant documentation for this case.   Syngo Images   Show images for CARDIAC CATHETERIZATION Images on Long Term Storage   Show images for Allene, [place] to Procedure Log  Procedure Log    Hemo Data  Flowsheet Row Most Recent Value  Fick Cardiac Output 7.18 L/min  Fick Cardiac Output Index 3.32 (L/min)/BSA  RA A Wave 8 mmHg  RA V Wave 6 mmHg  RA Mean 5 mmHg  RV Systolic Pressure 30 mmHg  RV Diastolic Pressure 1 mmHg  RV EDP 8 mmHg  PA Systolic Pressure 30 mmHg  PA Diastolic Pressure 0  mmHg  PA Mean 16 mmHg  PW A Wave 14 mmHg  PW V Wave 7 mmHg  PW Mean 5 mmHg  AO Systolic Pressure 010 mmHg  AO Diastolic Pressure 66 mmHg  AO Mean 89 mmHg  QP/QS 0.95  TPVR Index 5.07 HRUI  TSVR Index 26.8 HRUI  PVR SVR Ratio 0.14  TPVR/TSVR Ratio 0.19    Impression:  This 62 year old woman has stage D, severe, symptomatic aortic stenosis with NYHA class III symptoms of exertional fatigue and shortness of breath consistent with chronic diastolic congestive heart failure.  She has also been having episodes of dizziness with standing up or bending over as well as lower extremity edema.  I agree that aortic valve  replacement is indicated in this patient for relief of her symptoms and to prevent left ventricular dysfunction.  Given her young age I do not think she would be a good candidate for TAVR and I would recommend open surgical aortic valve replacement.  We discussed the alternatives of mechanical and bioprosthetic valves and she would like to use a bioprosthetic valve so she does not need to be on Coumadin.  I think that is a reasonable alternative for her.   I discussed the operative procedure with the patient and her daughter including alternatives, benefits and risks; including but not limited to bleeding, blood transfusion, infection, stroke, myocardial infarction, graft failure, heart block requiring a permanent pacemaker, organ dysfunction, and death.  [name] understands and agrees to proceed.  She has a bicuspid aortic valve and will require a CTA of the chest to evaluate her aorta for aneurysm.  Plan:  She will be scheduled for aortic valve replacement using a bioprosthetic valve on Monday, 08/11/2022.  She will be scheduled for preoperative CTA of the chest.  Gaye Pollack, MD [facility] [phone]

## 2022-08-06 NOTE — Progress Notes (Signed)
Surgical Instructions    Your procedure is scheduled on Monday, 08/11/22.  Report to [facility] Main Entrance "A" at 5:30 A.M., then check in with the Admitting office.  Call this number if you have problems the morning of surgery:  [phone]   If you have any questions prior to your surgery date call [phone]: Open Monday-Friday 8am-4pm If you experience any cold or flu symptoms such as cough, fever, chills, shortness of breath, etc. between now and your scheduled surgery, please notify us at the above number     Remember:  Do not eat or drink after midnight the night before your surgery     Take these medicines the morning of surgery with A SIP OF WATER:  allopurinol (ZYLOPRIM)  atorvastatin (LIPITOR)  DULoxetine (CYMBALTA)  gabapentin (NEURONTIN)  nitrofurantoin, macrocrystal-monohydrate, (MACROBID)  omeprazole (PRILOSEC)  progesterone (PROMETRIUM)   IF NEEDED: ALPRAZolam (XANAX)   As of today, STOP taking any Aspirin (unless otherwise instructed by your surgeon) Aleve, Naproxen, Ibuprofen, Motrin, Advil, Goody's, BC's, all herbal medications, fish oil, and all vitamins.  WHAT DO I DO ABOUT MY DIABETES MEDICATION?   Do not take oral diabetes medicines (pills) the morning of surgery.  Do not take TRULICITY 7 days prior to surgery. Do not take a dose after 08/03/22.  THE NIGHT BEFORE SURGERY, take 7 units of TRESIBA.      THE MORNING OF SURGERY, do not take metFORMIN (GLUCOPHAGE). Take 7 units of TRESIBA.  The day of surgery, do not take other diabetes injectables, including Byetta (exenatide), Bydureon (exenatide ER), Victoza (liraglutide), or Trulicity (dulaglutide).  If your CBG is greater than 220 mg/dL, you may take  of your sliding scale (correction) dose of insulin.   HOW TO MANAGE YOUR DIABETES BEFORE AND AFTER SURGERY  Why is it important to control my blood sugar before and after surgery? Improving blood sugar levels before and after surgery helps  healing and can limit problems. A way of improving blood sugar control is eating a healthy diet by:  Eating less sugar and carbohydrates  Increasing activity/exercise  Talking with your doctor about reaching your blood sugar goals High blood sugars (greater than 180 mg/dL) can raise your risk of infections and slow your recovery, so you will need to focus on controlling your diabetes during the weeks before surgery. Make sure that the doctor who takes care of your diabetes knows about your planned surgery including the date and location.  How do I manage my blood sugar before surgery? Check your blood sugar at least 4 times a day, starting 2 days before surgery, to make sure that the level is not too high or low.  Check your blood sugar the morning of your surgery when you wake up and every 2 hours until you get to the Short Stay unit.  If your blood sugar is less than 70 mg/dL, you will need to treat for low blood sugar: Do not take insulin. Treat a low blood sugar (less than 70 mg/dL) with  cup of clear juice (cranberry or apple), 4 glucose tablets, OR glucose gel. Recheck blood sugar in 15 minutes after treatment (to make sure it is greater than 70 mg/dL). If your blood sugar is not greater than 70 mg/dL on recheck, call [phone] for further instructions. Report your blood sugar to the short stay nurse when you get to Short Stay.  If you are admitted to the hospital after surgery: Your blood sugar will be checked by the staff and you  will probably be given insulin after surgery (instead of oral diabetes medicines) to make sure you have good blood sugar levels. The goal for blood sugar control after surgery is 80-180 mg/dL.            Do not wear jewelry or makeup. Do not wear lotions, powders, perfumes/cologne or deodorant. Do not shave 48 hours prior to surgery.   Do not bring valuables to the hospital. Do not wear nail polish, gel polish, artificial nails, or any other type of  covering on natural nails (fingers and toes) If you have artificial nails or gel coating that need to be removed by a nail salon, please have this removed prior to surgery. Artificial nails or gel coating may interfere with anesthesia's ability to adequately monitor your vital signs.  [facility] is not responsible for any belongings or valuables.    Do NOT Smoke (Tobacco/Vaping)  24 hours prior to your procedure  If you use a CPAP at night, you may bring your mask for your overnight stay.   Contacts, glasses, hearing aids, dentures or partials may not be worn into surgery, please bring cases for these belongings   For patients admitted to the hospital, discharge time will be determined by your treatment team.   Patients discharged the day of surgery will not be allowed to drive home, and someone needs to stay with them for 24 hours.   SURGICAL WAITING ROOM VISITATION Patients having surgery or a procedure may have no more than 2 support people in the waiting area - these visitors may rotate.   Children under the age of 45 must have an adult with them who is not the patient. If the patient needs to stay at the hospital during part of their recovery, the visitor guidelines for inpatient rooms apply. Pre-op nurse will coordinate an appropriate time for 1 support person to accompany patient in pre-op.  This support person may not rotate.   Please refer to [email or web address] for the visitor guidelines for Inpatients (after your surgery is over and you are in a regular room).    Special instructions:    Oral Hygiene is also important to reduce your risk of infection.  Remember - BRUSH YOUR TEETH THE MORNING OF SURGERY WITH YOUR REGULAR TOOTHPASTE   [facility]- Preparing For Surgery  Before surgery, you can play an important role. Because skin is not sterile, your skin needs to be as free of germs as possible. You can reduce the number of  germs on your skin by washing with CHG (chlorahexidine gluconate) Soap before surgery.  CHG is an antiseptic cleaner which kills germs and bonds with the skin to continue killing germs even after washing.     Please do not use if you have an allergy to CHG or antibacterial soaps. If your skin becomes reddened/irritated stop using the CHG.  Do not shave (including legs and underarms) for at least 48 hours prior to first CHG shower. It is OK to shave your face.  Please follow these instructions carefully.     Shower the NIGHT BEFORE SURGERY and the MORNING OF SURGERY with CHG Soap.   If you chose to wash your hair, wash your hair first as usual with your normal shampoo. After you shampoo, rinse your hair and body thoroughly to remove the shampoo.  Then [employer] and genitals (private parts) with your normal soap and rinse thoroughly to remove soap.  After that Use CHG Soap as you would any other liquid  soap. You can apply CHG directly to the skin and wash gently with a scrungie or a clean washcloth.   Apply the CHG Soap to your body ONLY FROM THE NECK DOWN.  Do not use on open wounds or open sores. Avoid contact with your eyes, ears, mouth and genitals (private parts). Wash Face and genitals (private parts)  with your normal soap.   Wash thoroughly, paying special attention to the area where your surgery will be performed.  Thoroughly rinse your body with warm water from the neck down.  DO NOT shower/wash with your normal soap after using and rinsing off the CHG Soap.  Pat yourself dry with a CLEAN TOWEL.  Wear CLEAN PAJAMAS to bed the night before surgery  Place CLEAN SHEETS on your bed the night before your surgery  DO NOT SLEEP WITH PETS.   Day of Surgery: Take a shower with CHG soap. Wear Clean/Comfortable clothing the morning of surgery Do not apply any deodorants/lotions.   Remember to brush your teeth WITH YOUR REGULAR TOOTHPASTE.    If you received a COVID test during  your pre-op visit, it is requested that you wear a mask when out in public, stay away from anyone that may not be feeling well, and notify your surgeon if you develop symptoms. If you have been in contact with anyone that has tested positive in the last 10 days, please notify your surgeon.    Please read over the following fact sheets that you were given.

## 2022-08-07 ENCOUNTER — Encounter (HOSPITAL_COMMUNITY): Payer: Self-pay

## 2022-08-07 ENCOUNTER — Other Ambulatory Visit: Payer: Self-pay

## 2022-08-07 ENCOUNTER — Ambulatory Visit (HOSPITAL_COMMUNITY)
Admission: RE | Admit: 2022-08-07 | Discharge: 2022-08-07 | Disposition: A | Discharge status: Home / Self Care | Payer: Medicare HMO | Source: Ambulatory Visit | Attending: Surgery | Admitting: Surgery

## 2022-08-07 ENCOUNTER — Encounter (HOSPITAL_COMMUNITY)
Admission: RE | Admit: 2022-08-07 | Discharge: 2022-08-07 | Disposition: A | Discharge status: Home / Self Care | Payer: Medicare HMO | Source: Ambulatory Visit | Attending: Surgery | Admitting: Surgery

## 2022-08-07 VITALS — BP 124/82 | HR 83 | Temp 97.5°F | Resp 18 | Ht 65.0 in | Wt 243.0 lb

## 2022-08-07 DIAGNOSIS — I350 Nonrheumatic aortic (valve) stenosis: Secondary | ICD-10-CM | POA: Diagnosis not present

## 2022-08-07 DIAGNOSIS — I10 Essential (primary) hypertension: Secondary | ICD-10-CM | POA: Diagnosis present

## 2022-08-07 DIAGNOSIS — Z1152 Encounter for screening for COVID-19: Secondary | ICD-10-CM | POA: Insufficient documentation

## 2022-08-07 DIAGNOSIS — E118 Type 2 diabetes mellitus with unspecified complications: Secondary | ICD-10-CM | POA: Diagnosis present

## 2022-08-07 DIAGNOSIS — Z01818 Encounter for other preprocedural examination: Secondary | ICD-10-CM | POA: Insufficient documentation

## 2022-08-07 HISTORY — DX: Gastro-esophageal reflux disease without esophagitis: K21.9

## 2022-08-07 LAB — TYPE AND SCREEN
ABO/RH(D): O POS
Antibody Screen: NEGATIVE

## 2022-08-07 LAB — CBC
HCT: 46.5 % — ABNORMAL HIGH (ref 36.0–46.0)
Hemoglobin: 15.1 g/dL — ABNORMAL HIGH (ref 12.0–15.0)
MCH: 30.3 pg (ref 26.0–34.0)
MCHC: 32.5 g/dL (ref 30.0–36.0)
MCV: 93.2 fL (ref 80.0–100.0)
Platelets: 279 10*3/uL (ref 150–400)
RBC: 4.99 MIL/uL (ref 3.87–5.11)
RDW: 13.4 % (ref 11.5–15.5)
WBC: 11.8 10*3/uL — ABNORMAL HIGH (ref 4.0–10.5)
nRBC: 0 % (ref 0.0–0.2)

## 2022-08-07 LAB — COMPREHENSIVE METABOLIC PANEL
ALT: 16 U/L (ref 0–44)
AST: 20 U/L (ref 15–41)
Albumin: 4 g/dL (ref 3.5–5.0)
Alkaline Phosphatase: 94 U/L (ref 38–126)
Anion gap: 10 (ref 5–15)
BUN: 20 mg/dL (ref 6–20)
CO2: 21 mmol/L — ABNORMAL LOW (ref 22–32)
Calcium: 9.3 mg/dL (ref 8.9–10.3)
Chloride: 107 mmol/L (ref 98–111)
Creatinine, Ser: 0.93 mg/dL (ref 0.44–1.00)
GFR, Estimated: >60 mL/min (ref >60)
Glucose, Bld: 115 mg/dL — ABNORMAL HIGH (ref 70–99)
Potassium: 4 mmol/L (ref 3.5–5.1)
Sodium: 138 mmol/L (ref 135–145)
Total Bilirubin: 0.5 mg/dL (ref 0.3–1.2)
Total Protein: 7.4 g/dL (ref 6.5–8.1)

## 2022-08-07 LAB — GLUCOSE, CAPILLARY: Glucose-Capillary: 119 mg/dL — ABNORMAL HIGH (ref 70–99)

## 2022-08-07 LAB — PROTIME-INR
INR: 0.9 (ref 0.8–1.2)
Prothrombin Time: 12.4 seconds (ref 11.4–15.2)

## 2022-08-07 LAB — SURGICAL PCR SCREEN
MRSA, PCR: NEGATIVE
Staphylococcus aureus: POSITIVE — AB

## 2022-08-07 LAB — APTT: aPTT: 32 seconds (ref 24–36)

## 2022-08-07 NOTE — Progress Notes (Signed)
PCP -  Lennie Odor, MD Cardiologist - Minus Breeding, MD  PPM/ICD - Denies  Chest x-ray - 08/07/2022 EKG - 08/07/2022 Stress Test - 09/06/2020 ECHO - 04/29/2022 Cardiac Cath - 07/09/2022  Sleep Study - Patient states sleep study was done years ago but was not bad enough to be diagnosed with sleep apnea.  DM: Type II Fasting Blood Sugar: 110-120 Checks Blood Sugar once a day  Last dose of GLP1 agonist-  Patient states Trulicity Last dose on [id number] GLP1 instructions: Hold 7 days prior to procedure  Blood Thinner Instructions: N/A Aspirin Instructions: N/A  ERAS Protcol - NPO PRE-SURGERY Ensure or G2- N/A  COVID TEST-  yes   Anesthesia review: Yes, Cardiac history  Patient denies shortness of breath, fever, cough and chest pain at PAT appointment   All instructions explained to the patient, with a verbal understanding of the material. Patient agrees to go over the instructions while at home for a better understanding.The opportunity to ask questions was provided.

## 2022-08-08 ENCOUNTER — Ambulatory Visit (HOSPITAL_COMMUNITY)
Admission: RE | Admit: 2022-08-08 | Discharge: 2022-08-08 | Disposition: A | Discharge status: Home / Self Care | Payer: Medicare HMO | Source: Ambulatory Visit | Attending: Surgery | Admitting: Surgery

## 2022-08-08 DIAGNOSIS — D62 Acute posthemorrhagic anemia: Secondary | ICD-10-CM | POA: Diagnosis not present

## 2022-08-08 DIAGNOSIS — Z6841 Body Mass Index (BMI) 40.0 and over, adult: Secondary | ICD-10-CM | POA: Diagnosis not present

## 2022-08-08 DIAGNOSIS — Z794 Long term (current) use of insulin: Secondary | ICD-10-CM | POA: Diagnosis not present

## 2022-08-08 DIAGNOSIS — E1140 Type 2 diabetes mellitus with diabetic neuropathy, unspecified: Secondary | ICD-10-CM | POA: Diagnosis not present

## 2022-08-08 DIAGNOSIS — E785 Hyperlipidemia, unspecified: Secondary | ICD-10-CM | POA: Diagnosis not present

## 2022-08-08 DIAGNOSIS — Z820 Family history of epilepsy and other diseases of the nervous system: Secondary | ICD-10-CM | POA: Diagnosis not present

## 2022-08-08 DIAGNOSIS — I480 Paroxysmal atrial fibrillation: Secondary | ICD-10-CM | POA: Diagnosis not present

## 2022-08-08 DIAGNOSIS — I5032 Chronic diastolic (congestive) heart failure: Secondary | ICD-10-CM | POA: Diagnosis present

## 2022-08-08 DIAGNOSIS — I350 Nonrheumatic aortic (valve) stenosis: Secondary | ICD-10-CM

## 2022-08-08 DIAGNOSIS — I712 Thoracic aortic aneurysm, without rupture, unspecified: Secondary | ICD-10-CM | POA: Diagnosis not present

## 2022-08-08 DIAGNOSIS — Z7984 Long term (current) use of oral hypoglycemic drugs: Secondary | ICD-10-CM | POA: Diagnosis not present

## 2022-08-08 DIAGNOSIS — M1990 Unspecified osteoarthritis, unspecified site: Secondary | ICD-10-CM | POA: Diagnosis present

## 2022-08-08 DIAGNOSIS — J90 Pleural effusion, not elsewhere classified: Secondary | ICD-10-CM | POA: Diagnosis not present

## 2022-08-08 DIAGNOSIS — E119 Type 2 diabetes mellitus without complications: Secondary | ICD-10-CM | POA: Diagnosis not present

## 2022-08-08 DIAGNOSIS — I110 Hypertensive heart disease with heart failure: Secondary | ICD-10-CM | POA: Diagnosis not present

## 2022-08-08 DIAGNOSIS — G43909 Migraine, unspecified, not intractable, without status migrainosus: Secondary | ICD-10-CM | POA: Diagnosis not present

## 2022-08-08 DIAGNOSIS — Q231 Congenital insufficiency of aortic valve: Secondary | ICD-10-CM | POA: Diagnosis not present

## 2022-08-08 DIAGNOSIS — Z825 Family history of asthma and other chronic lower respiratory diseases: Secondary | ICD-10-CM | POA: Diagnosis not present

## 2022-08-08 DIAGNOSIS — Z79899 Other long term (current) drug therapy: Secondary | ICD-10-CM | POA: Diagnosis not present

## 2022-08-08 DIAGNOSIS — I10 Essential (primary) hypertension: Secondary | ICD-10-CM | POA: Diagnosis not present

## 2022-08-08 DIAGNOSIS — F419 Anxiety disorder, unspecified: Secondary | ICD-10-CM | POA: Diagnosis present

## 2022-08-08 DIAGNOSIS — Z7985 Long-term (current) use of injectable non-insulin antidiabetic drugs: Secondary | ICD-10-CM | POA: Diagnosis not present

## 2022-08-08 DIAGNOSIS — Z952 Presence of prosthetic heart valve: Secondary | ICD-10-CM | POA: Diagnosis present

## 2022-08-08 DIAGNOSIS — R918 Other nonspecific abnormal finding of lung field: Secondary | ICD-10-CM | POA: Diagnosis not present

## 2022-08-08 DIAGNOSIS — Z6839 Body mass index (BMI) 39.0-39.9, adult: Secondary | ICD-10-CM | POA: Diagnosis not present

## 2022-08-08 LAB — SARS CORONAVIRUS 2 (TAT 6-24 HRS): SARS Coronavirus 2: NEGATIVE

## 2022-08-08 LAB — HEMOGLOBIN A1C
Hgb A1c MFr Bld: 6.4 % — ABNORMAL HIGH (ref 4.8–5.6)
Mean Plasma Glucose: 137 mg/dL

## 2022-08-08 MED ORDER — INSULIN REGULAR(HUMAN) IN NACL 100-0.9 UT/100ML-% IV SOLN
INTRAVENOUS | Status: AC
  Administered 2022-08-11: 3.4 [IU]/h via INTRAVENOUS
  Filled 2022-08-08: qty 100

## 2022-08-08 MED ORDER — MANNITOL 20 % IV SOLN
INTRAVENOUS | Status: DC
  Filled 2022-08-08: qty 13

## 2022-08-08 MED ORDER — DEXMEDETOMIDINE HCL IN NACL 400 MCG/100ML IV SOLN
0.1000 ug/kg/h | INTRAVENOUS | Status: AC
  Administered 2022-08-11: .7 ug/kg/h via INTRAVENOUS
  Filled 2022-08-08: qty 100

## 2022-08-08 MED ORDER — MILRINONE LACTATE IN DEXTROSE 20-5 MG/100ML-% IV SOLN
0.3000 ug/kg/min | INTRAVENOUS | Status: DC
  Filled 2022-08-08: qty 100

## 2022-08-08 MED ORDER — POTASSIUM CHLORIDE 2 MEQ/ML IV SOLN
80.0000 meq | INTRAVENOUS | Status: DC
  Filled 2022-08-08: qty 40

## 2022-08-08 MED ORDER — NOREPINEPHRINE 4 MG/250ML-% IV SOLN
0.0000 ug/min | INTRAVENOUS | Status: DC
  Filled 2022-08-08: qty 250

## 2022-08-08 MED ORDER — TRANEXAMIC ACID 1000 MG/10ML IV SOLN
1.5000 mg/kg/h | INTRAVENOUS | Status: AC
  Administered 2022-08-11: 1.5 mg/kg/h via INTRAVENOUS
  Filled 2022-08-08: qty 25

## 2022-08-08 MED ORDER — CEFAZOLIN SODIUM-DEXTROSE 2-4 GM/100ML-% IV SOLN
2.0000 g | INTRAVENOUS | Status: AC
  Administered 2022-08-11: 2 g via INTRAVENOUS
  Filled 2022-08-08: qty 100

## 2022-08-08 MED ORDER — EPINEPHRINE HCL 5 MG/250ML IV SOLN IN NS
0.0000 ug/min | INTRAVENOUS | Status: DC
  Filled 2022-08-08: qty 250

## 2022-08-08 MED ORDER — PHENYLEPHRINE HCL-NACL 20-0.9 MG/250ML-% IV SOLN
30.0000 ug/min | INTRAVENOUS | Status: AC
  Administered 2022-08-11: 10 ug/min via INTRAVENOUS
  Filled 2022-08-08: qty 250

## 2022-08-08 MED ORDER — VANCOMYCIN HCL 1500 MG/300ML IV SOLN
1500.0000 mg | INTRAVENOUS | Status: AC
  Administered 2022-08-11: 1500 mg via INTRAVENOUS
  Filled 2022-08-08: qty 300

## 2022-08-08 MED ORDER — TRANEXAMIC ACID (OHS) PUMP PRIME SOLUTION
2.0000 mg/kg | INTRAVENOUS | Status: DC
  Filled 2022-08-08: qty 2.2

## 2022-08-08 MED ORDER — NITROGLYCERIN IN D5W 200-5 MCG/ML-% IV SOLN
2.0000 ug/min | INTRAVENOUS | Status: DC
  Filled 2022-08-08: qty 250

## 2022-08-08 MED ORDER — PLASMA-LYTE A IV SOLN
INTRAVENOUS | Status: DC
  Filled 2022-08-08: qty 2.5

## 2022-08-08 MED ORDER — TRANEXAMIC ACID (OHS) BOLUS VIA INFUSION
15.0000 mg/kg | INTRAVENOUS | Status: AC
  Administered 2022-08-11: 1653 mg via INTRAVENOUS
  Filled 2022-08-08: qty 1653

## 2022-08-08 MED ORDER — HEPARIN 30,000 UNITS/1000 ML (OHS) CELLSAVER SOLUTION
Status: DC
  Filled 2022-08-08: qty 1000

## 2022-08-08 MED ORDER — IOHEXOL 350 MG/ML SOLN
75.0000 mL | Freq: Once | INTRAVENOUS | Status: AC | PRN
  Administered 2022-08-08: 75 mL via INTRAVENOUS

## 2022-08-10 NOTE — H&P (Signed)
[address]       [place] [address]             [phone]      Cardiothoracic Surgery Admission History and Physical   PCP is [facility], [place], [place] Referring Provider is Early Osmond, MD       Chief Complaint  Patient presents with   Aortic Stenosis            HPI:   The patient is a 62 year old woman with a history of morbid obesity, diabetes, hyperlipidemia, hypertension, and aortic stenosis who was referred for consideration of aortic valve replacement.  She reports a 6-12 month history of progressive exertional fatigue and shortness of breath as well as episodes of dizziness when bending over and bilateral lower extremity edema.  Her most recent echocardiogram on 04/29/2022 showed a bicuspid aortic valve with a mean gradient of 38 mmHg and a peak gradient of 68 mmHg.  Aortic valve area by VTI was 1.22 cm.  The aortic valve leaflets were calcified and thickened with poor leaflet mobility.  Left ventricular ejection fraction was 60 to 65% with grade 1 diastolic dysfunction.  There was no mitral regurgitation.  Cardiac catheterization showed a normal left dominant circulation with normal filling pressures and a cardiac index of 3.3.  She said that she has been trying to lose weight and has lost about 70 pounds but has not noted any improvement in her symptoms.        Past Medical History:  Diagnosis Date   Anxiety     Arthritis     Depression     Diabetes mellitus without complication ([place])     Heart murmur     Hyperlipidemia     Hypertension     Migraines     Neuropathy     Obesity     Right sided sciatica             Past Surgical History:  Procedure Laterality Date   BREAST SURGERY Left 04/2014    biopsy    CESAREAN SECTION       HYSTEROSCOPY WITH D & C N/A 08/20/2021    Procedure: DILATATION AND CURETTAGE /HYSTEROSCOPY;  Surgeon: Janyth Pupa, DO;  Location: [facility];  Service: Gynecology;  Laterality: N/A;   KNEE SURGERY       LEFT  HEART CATH AND CORONARY ANGIOGRAPHY N/A 07/09/2022    Procedure: LEFT HEART CATH AND CORONARY ANGIOGRAPHY;  Surgeon: Early Osmond, MD;  Location: [facility];  Service: Cardiovascular;  Laterality: N/A;   NASAL SEPTOPLASTY W/ TURBINOPLASTY       POLYPECTOMY N/A 08/20/2021    Procedure: POLYPECTOMY(WITH MYOSURE DEVICE);  Surgeon: Janyth Pupa, DO;  Location: [facility];  Service: Gynecology;  Laterality: N/A;   RIGHT HEART CATH N/A 07/09/2022    Procedure: RIGHT HEART CATH;  Surgeon: Early Osmond, MD;  Location: [facility];  Service: Cardiovascular;  Laterality: N/A;           Family History  Problem Relation Age of Onset   COPD Father     Muscular dystrophy Sister     Lupus Son        Social History Social History        Tobacco Use   Smoking status: Never   Smokeless tobacco: Never  Vaping Use   Vaping Use: Never used  Substance Use Topics   Alcohol use: No   Drug use:  No            Current Outpatient Medications  Medication Sig Dispense Refill   ACCU-CHEK AVIVA PLUS test strip         allopurinol (ZYLOPRIM) 300 MG tablet Take 300 mg by mouth daily.       ALPRAZolam (XANAX) 0.5 MG tablet TAKE ONE TABLET BY MOUTH EVERY 8 HOURS AS NEEDED 60 tablet 0   aspirin-acetaminophen-caffeine (EXCEDRIN MIGRAINE) [id number] MG per tablet Take 2 tablets by mouth every 6 (six) hours as needed for headache.       atorvastatin (LIPITOR) 80 MG tablet Take 80 mg by mouth daily.       Blood Glucose Monitoring Suppl (ACCU-CHEK AVIVA PLUS) w/Device KIT See admin instructions.       DULoxetine (CYMBALTA) 60 MG capsule Take 60 mg by mouth daily.       furosemide (LASIX) 20 MG tablet Take 20 mg by mouth daily.       gabapentin (NEURONTIN) 300 MG capsule Take 300-600 mg by mouth See admin instructions. 300 mg twice daily, 600 mg at bedtime       Glycerin-Hypromellose-PEG 400 (DRY EYE RELIEF DROPS) 0.2-0.2-1 % SOLN Place 1 drop into both eyes daily as needed (dry eyes).        ibuprofen (ADVIL) 800 MG tablet Take 1 tablet (800 mg total) by mouth every 8 (eight) hours as needed. Use for cramping 60 tablet 1   losartan-hydrochlorothiazide (HYZAAR) 100-12.5 MG tablet Take 1 tablet by mouth daily.       metFORMIN (GLUCOPHAGE) 1000 MG tablet Take 1,000 mg by mouth 2 (two) times daily with a meal.       nitrofurantoin, macrocrystal-monohydrate, (MACROBID) 100 MG capsule Take 1 capsule (100 mg total) by mouth 2 (two) times daily. 14 capsule 0   omeprazole (PRILOSEC) 40 MG capsule Take 40 mg by mouth daily.       progesterone (PROMETRIUM) 100 MG capsule Take 1 capsule (100 mg total) by mouth daily. 90 capsule 4   TRESIBA FLEXTOUCH 100 UNIT/ML SOPN FlexTouch Pen Inject 25 Units into the skin daily.       TRULICITY 4.5 0000000 SOPN Inject 4.5 mg into the skin once a week.        No current facility-administered medications for this visit.      No Known Allergies   Review of Systems  Constitutional:  Positive for fatigue.  HENT: Negative.  Negative for dental problem.   Eyes: Negative.   Respiratory:  Positive for shortness of breath.   Cardiovascular:  Positive for leg swelling. Negative for chest pain.  Gastrointestinal: Negative.   Endocrine: Negative.   Genitourinary: Negative.   Musculoskeletal: Negative.   Skin: Negative.   Allergic/Immunologic: Negative.   Neurological:  Positive for dizziness. Negative for syncope.  Hematological: Negative.   Psychiatric/Behavioral:  The patient is nervous/anxious.       Ht '5\' 6"'$  (1.676 m)   Wt 240 lb (108.9 kg)   LMP 10/07/2012   BMI 38.74 kg/m  Physical Exam Constitutional:      Appearance: Normal appearance. She is obese.  HENT:     Head: Normocephalic and atraumatic.  Eyes:     Extraocular Movements: Extraocular movements intact.     Conjunctiva/sclera: Conjunctivae normal.     Pupils: Pupils are equal, round, and reactive to light.  Neck:     Vascular: No carotid bruit.  Cardiovascular:     Rate and  Rhythm: Normal rate and regular rhythm.  Pulses: Normal pulses.     Heart sounds: Murmur heard.     Comments: 3/6 systolic murmur along the right sternal border.  There is no diastolic murmur. Pulmonary:     Effort: Pulmonary effort is normal.     Breath sounds: Normal breath sounds.  Abdominal:     General: Bowel sounds are normal. There is no distension.     Palpations: Abdomen is soft.     Tenderness: There is no abdominal tenderness.  Musculoskeletal:        General: Swelling present.  Skin:    General: Skin is warm and dry.  Neurological:     General: No focal deficit present.     Mental Status: She is alert and oriented to person, place, and time.  Psychiatric:        Mood and Affect: Mood normal.        Behavior: Behavior normal.        Diagnostic Tests:     ECHOCARDIOGRAM REPORT       Patient Name:   LAKKEN CIPOLLA Date of Exam: 04/29/2022  Medical Rec #:  [id number]       Height:       66.0 in  Accession #:    [id number]      Weight:       267.4 lb  Date of Birth:  [birth year]       BSA:          2.263 m  Patient Age:    61 years        BP:           142/86 mmHg  Patient Gender: F               HR:           82 bpm.  Exam Location:  [address]   Procedure: 2D Echo, Cardiac Doppler, Color Doppler and Strain Analysis   Indications:    I35 Aortic stenosis    History:        Patient has prior history of Echocardiogram examinations,  most                 recent 10/08/2021. Aortic Valve Disease,  Signs/Symptoms:Shortness                 of Breath; Risk Factors:Hypertension, Diabetes and  Dyslipidemia.    Sonographer:   Basilia Jumbo BS, RDCS  Referring Phys: [place]     1. Bicuspid aortic valve with severe AS (mean gradient 38 mmHg; peak  velocity of 4.1 m/s); LVOT gradient of 3.2 m/s with valsalva.   2. Left ventricular ejection fraction, by estimation, is 60 to 65%. The  left ventricle has normal function. The left ventricle  has no regional  wall motion abnormalities. There is mild left ventricular hypertrophy.  Left ventricular diastolic parameters  are consistent with Grade I diastolic dysfunction (impaired relaxation).  The average left ventricular global longitudinal strain is -20.4 %. The  global longitudinal strain is normal.   3. Right ventricular systolic function is normal. The right ventricular  size is normal.   4. The mitral valve is normal in structure. No evidence of mitral valve  regurgitation. No evidence of mitral stenosis. Moderate mitral annular  calcification.   5. The aortic valve is bicuspid. Aortic valve regurgitation is trivial.  Severe aortic valve stenosis.   6. The inferior vena cava is normal in size with greater than 50%  respiratory variability, suggesting right atrial pressure of 3 mmHg.   Comparison(s): 10/08/21 EF 60-65%. Moderate-severe AS 74mHg mean PG, 484mg  peak PG.   FINDINGS   Left Ventricle: Left ventricular ejection fraction, by estimation, is 60  to 65%. The left ventricle has normal function. The left ventricle has no  regional wall motion abnormalities. The average left ventricular global  longitudinal strain is -20.4 %.  The global longitudinal strain is normal. The left ventricular internal  cavity size was normal in size. There is mild left ventricular  hypertrophy. Left ventricular diastolic parameters are consistent with  Grade I diastolic dysfunction (impaired  relaxation).   Right Ventricle: The right ventricular size is normal. Right ventricular  systolic function is normal.   Left Atrium: Left atrial size was normal in size.   Right Atrium: Right atrial size was normal in size.   Pericardium: There is no evidence of pericardial effusion.   Mitral Valve: The mitral valve is normal in structure. Moderate mitral  annular calcification. No evidence of mitral valve regurgitation. No  evidence of mitral valve stenosis.   Tricuspid Valve: The  tricuspid valve is normal in structure. Tricuspid  valve regurgitation is trivial. No evidence of tricuspid stenosis.   Aortic Valve: The aortic valve is bicuspid. Aortic valve regurgitation is  trivial. Severe aortic stenosis is present. Aortic valve mean gradient  measures 38.0 mmHg. Aortic valve peak gradient measures 67.6 mmHg. Aortic  valve area, by VTI measures 1.22  cm.   Pulmonic Valve: The pulmonic valve was normal in structure. Pulmonic valve  regurgitation is not visualized. No evidence of pulmonic stenosis.   Aorta: The aortic root is normal in size and structure.   Venous: The inferior vena cava is normal in size with greater than 50%  respiratory variability, suggesting right atrial pressure of 3 mmHg.   IAS/Shunts: No atrial level shunt detected by color flow Doppler.   Additional Comments: Bicuspid aortic valve with severe AS (mean gradient  38 mmHg; peak velocity of 4.1 m/s); LVOT gradient of 3.2 m/s with  valsalva.     LEFT VENTRICLE  PLAX 2D  LVIDd:         3.80 cm   Diastology  LVIDs:         2.60 cm   LV e' medial:    5.03 cm/s  LV PW:         1.20 cm   LV E/e' medial:  12.8  LV IVS:        1.10 cm   LV e' lateral:   8.48 cm/s  LVOT diam:     2.30 cm   LV E/e' lateral: 7.6  LV SV:         83  LV SV Index:   37        2D Longitudinal Strain  LVOT Area:     4.15 cm  2D Strain GLS (A2C):   -19.3 %                           2D Strain GLS (A3C):   -19.1 %                           2D Strain GLS (A4C):   -22.8 %                           2D Strain  GLS Avg:     -20.4 %                             3D Volume EF:                           3D EF:        61 %                           LV EDV:       118 ml                           LV ESV:       46 ml                           LV SV:        71 ml   RIGHT VENTRICLE             IVC  RV Basal diam:  3.60 cm     IVC diam: 1.90 cm  RV S prime:     10.60 cm/s  TAPSE (M-mode): 2.0 cm   LEFT ATRIUM             Index         RIGHT ATRIUM           Index  LA diam:        3.40 cm 1.50 cm/m   RA Pressure: 3.00 mmHg  LA Vol (A2C):   29.4 ml 12.99 ml/m  RA Area:     14.10 cm  LA Vol (A4C):   29.8 ml 13.17 ml/m  RA Volume:   34.40 ml  15.20 ml/m  LA Biplane Vol: 29.7 ml 13.13 ml/m   AORTIC VALVE  AV Area (Vmax):    0.95 cm  AV Area (Vmean):   0.97 cm  AV Area (VTI):     1.22 cm  AV Vmax:           411.00 cm/s  AV Vmean:          277.000 cm/s  AV VTI:            0.680 m  AV Peak Grad:      67.6 mmHg  AV Mean Grad:      38.0 mmHg  LVOT Vmax:         94.30 cm/s  LVOT Vmean:        64.500 cm/s  LVOT VTI:          0.199 m  LVOT/AV VTI ratio: 0.29    AORTA  Ao Root diam: 3.60 cm  Ao Asc diam:  3.60 cm   MITRAL VALVE                TRICUSPID VALVE                              Estimated RAP:  3.00 mmHg  MV Decel Time: 292 msec  MV E velocity: 64.50 cm/s   SHUNTS  MV A velocity: 110.00 cm/s  Systemic VTI:  0.20 m  MV E/A ratio:  0.59         Systemic Diam: 2.30 cm   Kirk Ruths  MD  Electronically signed by Kirk Ruths MD  Signature Date/Time: 04/29/2022/1:13:29 PM        Final        Physicians   Panel Physicians Referring Physician Case Authorizing Physician  Early Osmond, MD (Primary)        Procedures   LEFT HEART CATH AND CORONARY ANGIOGRAPHY  RIGHT HEART CATH    Conclusion   1.  Normal left dominant circulation. 2.  Fick cardiac output of 7.2 L/min, Fick cardiac index of 3.3 L/min/m with mean RA pressure of 5 mmHg, RV pressure of 30/1 with an RV end-diastolic pressure of 8 mmHg, mean wedge pressure of 5 mmHg, and mean PA pressure of 16 mmHg.   Recommendation: Cardiothoracic surgical evaluation.   Indications   Aortic stenosis due to bicuspid aortic valve [Q23.0, Q23.1 (ICD-10-CM)]    Clinical Presentation   CHF/Shock Congestive heart failure not present. No shock present.    Procedural Details   Technical Details The patient is a 62 year old female with a  history of type 2 diabetes and hyperlipidemia who has developed severe symptomatic bicuspid aortic stenosis.  She is referred for preprocedural assessment consisting of a right heart catheterization and coronary angiography study.  After obtaining consent the patient brought to the cardiac catheterization laboratory and prepped draped sterile fashion.  A previous placed antecubital IV was exchanged for 6 French humeral glide sheath.  Xylocaine was used to anesthetize the right wrist and a 6 [place] normal glide sheath was placed.  5000 units heparin and 5 mg of verapamil were administered through the sheath.  A 6 [place] Tig catheter was used for coronary angiography and a 5 [place] balloontipped catheter was used for right heart catheterization.  A TR band was placed and manual pressure applied to the antecubital site.  There are no acute complications. Estimated blood loss <50 mL.   During this procedure medications were administered to achieve and maintain moderate conscious sedation while the patient's heart rate, blood pressure, and oxygen saturation were continuously monitored and I was present face-to-face 100% of this time.    Medications (Filter: Administrations occurring from [phone] to 1009 on 07/09/22) Heparin (Porcine) in NaCl 1000-0.9 UT/500ML-% SOLN (mL)  Total volume: 1,000 mL Date/Time Rate/Dose/Volume Action    07/09/22 0927 500 mL Given    0927 500 mL Given    fentaNYL (SUBLIMAZE) injection (mcg)  Total dose: 25 mcg Date/Time Rate/Dose/Volume Action    07/09/22 0944 25 mcg Given    midazolam (VERSED) injection (mg)  Total dose: 1 mg Date/Time Rate/Dose/Volume Action    07/09/22 0944 1 mg Given    lidocaine (PF) (XYLOCAINE) 1 % injection (mL)  Total volume: 7 mL Date/Time Rate/Dose/Volume Action    07/09/22 0945 5 mL Given    0945 2 mL Given    Radial Cocktail/Verapamil only (mL)  Total volume: 10 mL Date/Time Rate/Dose/Volume Action    07/09/22 0948 10 mL Given     heparin sodium (porcine) injection (Units)  Total dose: 5,000 Units Date/Time Rate/Dose/Volume Action    07/09/22 0948 5,000 Units Given    iohexol (OMNIPAQUE) 350 MG/ML injection (mL)  Total volume: 30 mL Date/Time Rate/Dose/Volume Action    07/09/22 1003 30 mL Given      Sedation Time   Sedation Time Physician-1: 16 minutes 48 seconds Contrast   Medication Name Total Dose  iohexol (OMNIPAQUE) 350 MG/ML injection 30 mL    Radiation/Fluoro   Fluoro time: 3 (min) DAP: [id number] (mGycm2) Cumulative Air Kerma:  0000000 (mGy) Complications   Complications documented before study signed (07/09/2022 [id number] AM)    No complications were associated with this study.  Documented by Early Osmond, MD - 07/09/2022 10:07 AM      Coronary Findings   Diagnostic Dominance: Left No diagnostic findings have been documented. Intervention   No interventions have been documented. Coronary Diagrams   Diagnostic Dominance: Left  Intervention    Implants    No implant documentation for this case.    Syngo Images    Show images for CARDIAC CATHETERIZATION Images on Long Term Storage    Show images for [place], [place] to Procedure Log   Procedure Log    Hemo Data   Flowsheet Row Most Recent Value  Fick Cardiac Output 7.18 L/min  Fick Cardiac Output Index 3.32 (L/min)/BSA  RA A Wave 8 mmHg  RA V Wave 6 mmHg  RA Mean 5 mmHg  RV Systolic Pressure 30 mmHg  RV Diastolic Pressure 1 mmHg  RV EDP 8 mmHg  PA Systolic Pressure 30 mmHg  PA Diastolic Pressure 0 mmHg  PA Mean 16 mmHg  PW A Wave 14 mmHg  PW V Wave 7 mmHg  PW Mean 5 mmHg  AO Systolic Pressure 123456 mmHg  AO Diastolic Pressure 66 mmHg  AO Mean 89 mmHg  QP/QS 0.95  TPVR Index 5.07 HRUI  TSVR Index 26.8 HRUI  PVR SVR Ratio 0.14  TPVR/TSVR Ratio 0.19      Impression:   This 62 year old woman has stage D, severe, symptomatic aortic stenosis with NYHA class III symptoms of exertional fatigue and shortness of  breath consistent with chronic diastolic congestive heart failure.  She has also been having episodes of dizziness with standing up or bending over as well as lower extremity edema.  I agree that aortic valve replacement is indicated in this patient for relief of her symptoms and to prevent left ventricular dysfunction.  Given her young age I do not think she would be a good candidate for TAVR and I would recommend open surgical aortic valve replacement.  We discussed the alternatives of mechanical and bioprosthetic valves and she would like to use a bioprosthetic valve so she does not need to be on Coumadin.  I think that is a reasonable alternative for her. CTA of the chest shows no aortic aneurysm.   I discussed the operative procedure with the patient and her daughter including alternatives, benefits and risks; including but not limited to bleeding, blood transfusion, infection, stroke, myocardial infarction, graft failure, heart block requiring a permanent pacemaker, organ dysfunction, and death.  [name] understands and agrees to proceed.     Plan:   Aortic valve replacement using a bioprosthetic valve.    Gaye Pollack, MD [facility] [phone]

## 2022-08-11 ENCOUNTER — Inpatient Hospital Stay (HOSPITAL_COMMUNITY): Payer: Medicare HMO

## 2022-08-11 ENCOUNTER — Other Ambulatory Visit: Payer: Self-pay

## 2022-08-11 ENCOUNTER — Inpatient Hospital Stay (HOSPITAL_COMMUNITY)
Admission: RE | Admit: 2022-08-11 | Discharge: 2022-08-17 | DRG: 220 | Disposition: A | Discharge status: Home / Self Care | Payer: Medicare HMO | Attending: Surgery | Admitting: Surgery

## 2022-08-11 ENCOUNTER — Inpatient Hospital Stay (HOSPITAL_COMMUNITY): Payer: Medicare HMO | Admitting: Physician Assistant

## 2022-08-11 ENCOUNTER — Inpatient Hospital Stay (HOSPITAL_COMMUNITY): Payer: Medicare HMO | Admitting: Certified Registered Nurse Anesthetist

## 2022-08-11 ENCOUNTER — Encounter (HOSPITAL_COMMUNITY)
Admission: RE | Disposition: A | Discharge status: Home / Self Care | Payer: Self-pay | Source: Home / Self Care | Attending: Surgery

## 2022-08-11 ENCOUNTER — Encounter (HOSPITAL_COMMUNITY): Payer: Self-pay | Admitting: Surgery

## 2022-08-11 DIAGNOSIS — J90 Pleural effusion, not elsewhere classified: Secondary | ICD-10-CM | POA: Diagnosis not present

## 2022-08-11 DIAGNOSIS — Z825 Family history of asthma and other chronic lower respiratory diseases: Secondary | ICD-10-CM

## 2022-08-11 DIAGNOSIS — Z6839 Body mass index (BMI) 39.0-39.9, adult: Secondary | ICD-10-CM

## 2022-08-11 DIAGNOSIS — Z794 Long term (current) use of insulin: Secondary | ICD-10-CM

## 2022-08-11 DIAGNOSIS — E1140 Type 2 diabetes mellitus with diabetic neuropathy, unspecified: Secondary | ICD-10-CM | POA: Diagnosis present

## 2022-08-11 DIAGNOSIS — I350 Nonrheumatic aortic (valve) stenosis: Principal | ICD-10-CM | POA: Diagnosis present

## 2022-08-11 DIAGNOSIS — I10 Essential (primary) hypertension: Secondary | ICD-10-CM | POA: Diagnosis not present

## 2022-08-11 DIAGNOSIS — Z820 Family history of epilepsy and other diseases of the nervous system: Secondary | ICD-10-CM

## 2022-08-11 DIAGNOSIS — E119 Type 2 diabetes mellitus without complications: Secondary | ICD-10-CM | POA: Diagnosis not present

## 2022-08-11 DIAGNOSIS — Z7984 Long term (current) use of oral hypoglycemic drugs: Secondary | ICD-10-CM

## 2022-08-11 DIAGNOSIS — I480 Paroxysmal atrial fibrillation: Secondary | ICD-10-CM | POA: Diagnosis not present

## 2022-08-11 DIAGNOSIS — G43909 Migraine, unspecified, not intractable, without status migrainosus: Secondary | ICD-10-CM | POA: Diagnosis present

## 2022-08-11 DIAGNOSIS — Z79899 Other long term (current) drug therapy: Secondary | ICD-10-CM | POA: Diagnosis not present

## 2022-08-11 DIAGNOSIS — E118 Type 2 diabetes mellitus with unspecified complications: Secondary | ICD-10-CM

## 2022-08-11 DIAGNOSIS — Z7985 Long-term (current) use of injectable non-insulin antidiabetic drugs: Secondary | ICD-10-CM

## 2022-08-11 DIAGNOSIS — Z6841 Body Mass Index (BMI) 40.0 and over, adult: Secondary | ICD-10-CM | POA: Diagnosis not present

## 2022-08-11 DIAGNOSIS — I5032 Chronic diastolic (congestive) heart failure: Secondary | ICD-10-CM | POA: Diagnosis not present

## 2022-08-11 DIAGNOSIS — E785 Hyperlipidemia, unspecified: Secondary | ICD-10-CM | POA: Diagnosis present

## 2022-08-11 DIAGNOSIS — Z952 Presence of prosthetic heart valve: Principal | ICD-10-CM

## 2022-08-11 DIAGNOSIS — M1990 Unspecified osteoarthritis, unspecified site: Secondary | ICD-10-CM | POA: Diagnosis present

## 2022-08-11 DIAGNOSIS — I110 Hypertensive heart disease with heart failure: Secondary | ICD-10-CM | POA: Diagnosis not present

## 2022-08-11 DIAGNOSIS — D62 Acute posthemorrhagic anemia: Secondary | ICD-10-CM | POA: Diagnosis not present

## 2022-08-11 DIAGNOSIS — R918 Other nonspecific abnormal finding of lung field: Secondary | ICD-10-CM | POA: Diagnosis not present

## 2022-08-11 DIAGNOSIS — F419 Anxiety disorder, unspecified: Secondary | ICD-10-CM | POA: Diagnosis present

## 2022-08-11 HISTORY — PX: AORTIC VALVE REPLACEMENT: SHX41

## 2022-08-11 HISTORY — PX: TEE WITHOUT CARDIOVERSION: SHX5443

## 2022-08-11 LAB — BASIC METABOLIC PANEL
Anion gap: 8 (ref 5–15)
BUN: 12 mg/dL (ref 6–20)
CO2: 23 mmol/L (ref 22–32)
Calcium: 7.9 mg/dL — ABNORMAL LOW (ref 8.9–10.3)
Chloride: 109 mmol/L (ref 98–111)
Creatinine, Ser: 0.71 mg/dL (ref 0.44–1.00)
GFR, Estimated: >60 mL/min (ref >60)
Glucose, Bld: 117 mg/dL — ABNORMAL HIGH (ref 70–99)
Potassium: 4.2 mmol/L (ref 3.5–5.1)
Sodium: 140 mmol/L (ref 135–145)

## 2022-08-11 LAB — BLOOD GAS, ARTERIAL
Acid-Base Excess: 0.8 mmol/L (ref 0.0–2.0)
Bicarbonate: 26.6 mmol/L (ref 20.0–28.0)
Drawn by: 454471
O2 Saturation: 97.6 %
Patient temperature: 37
pCO2 arterial: 46 mmHg (ref 32–48)
pH, Arterial: 7.37 (ref 7.35–7.45)
pO2, Arterial: 96 mmHg (ref 83–108)

## 2022-08-11 LAB — POCT I-STAT 7, (LYTES, BLD GAS, ICA,H+H)
Acid-Base Excess: 0 mmol/L (ref 0.0–2.0)
Acid-base deficit: 2 mmol/L (ref 0.0–2.0)
Acid-base deficit: 4 mmol/L — ABNORMAL HIGH (ref 0.0–2.0)
Acid-base deficit: 4 mmol/L — ABNORMAL HIGH (ref 0.0–2.0)
Acid-base deficit: 5 mmol/L — ABNORMAL HIGH (ref 0.0–2.0)
Bicarbonate: 24.5 mmol/L (ref 20.0–28.0)
Bicarbonate: 22.3 mmol/L (ref 20.0–28.0)
Bicarbonate: 23.3 mmol/L (ref 20.0–28.0)
Bicarbonate: 21.9 mmol/L (ref 20.0–28.0)
Bicarbonate: 22.3 mmol/L (ref 20.0–28.0)
Calcium, Ion: 1.02 mmol/L — ABNORMAL LOW (ref 1.15–1.40)
Calcium, Ion: 1.07 mmol/L — ABNORMAL LOW (ref 1.15–1.40)
Calcium, Ion: 1.2 mmol/L (ref 1.15–1.40)
Calcium, Ion: 1.16 mmol/L (ref 1.15–1.40)
Calcium, Ion: 1.2 mmol/L (ref 1.15–1.40)
HCT: 26 % — ABNORMAL LOW (ref 36.0–46.0)
HCT: 28 % — ABNORMAL LOW (ref 36.0–46.0)
HCT: 31 % — ABNORMAL LOW (ref 36.0–46.0)
HCT: 30 % — ABNORMAL LOW (ref 36.0–46.0)
HCT: 31 % — ABNORMAL LOW (ref 36.0–46.0)
Hemoglobin: 8.8 g/dL — ABNORMAL LOW (ref 12.0–15.0)
Hemoglobin: 9.5 g/dL — ABNORMAL LOW (ref 12.0–15.0)
Hemoglobin: 10.5 g/dL — ABNORMAL LOW (ref 12.0–15.0)
Hemoglobin: 10.2 g/dL — ABNORMAL LOW (ref 12.0–15.0)
Hemoglobin: 10.5 g/dL — ABNORMAL LOW (ref 12.0–15.0)
O2 Saturation: 100 %
O2 Saturation: 100 %
O2 Saturation: 97 %
O2 Saturation: 99 %
O2 Saturation: 99 %
Patient temperature: 35.6
Patient temperature: 36.3
Patient temperature: 36.4
Potassium: 3.5 mmol/L (ref 3.5–5.1)
Potassium: 5 mmol/L (ref 3.5–5.1)
Potassium: 3.8 mmol/L (ref 3.5–5.1)
Potassium: 4.4 mmol/L (ref 3.5–5.1)
Potassium: 4.4 mmol/L (ref 3.5–5.1)
Sodium: 140 mmol/L (ref 135–145)
Sodium: 138 mmol/L (ref 135–145)
Sodium: 141 mmol/L (ref 135–145)
Sodium: 141 mmol/L (ref 135–145)
Sodium: 140 mmol/L (ref 135–145)
TCO2: 26 mmol/L (ref 22–32)
TCO2: 23 mmol/L (ref 22–32)
TCO2: 25 mmol/L (ref 22–32)
TCO2: 23 mmol/L (ref 22–32)
TCO2: 24 mmol/L (ref 22–32)
pCO2 arterial: 39.5 mmHg (ref 32–48)
pCO2 arterial: 35.6 mmHg (ref 32–48)
pCO2 arterial: 46.7 mmHg (ref 32–48)
pCO2 arterial: 43.3 mmHg (ref 32–48)
pCO2 arterial: 47.3 mmHg (ref 32–48)
pH, Arterial: 7.4 (ref 7.35–7.45)
pH, Arterial: 7.405 (ref 7.35–7.45)
pH, Arterial: 7.299 — ABNORMAL LOW (ref 7.35–7.45)
pH, Arterial: 7.308 — ABNORMAL LOW (ref 7.35–7.45)
pH, Arterial: 7.278 — ABNORMAL LOW (ref 7.35–7.45)
pO2, Arterial: 349 mmHg — ABNORMAL HIGH (ref 83–108)
pO2, Arterial: 323 mmHg — ABNORMAL HIGH (ref 83–108)
pO2, Arterial: 92 mmHg (ref 83–108)
pO2, Arterial: 145 mmHg — ABNORMAL HIGH (ref 83–108)
pO2, Arterial: 160 mmHg — ABNORMAL HIGH (ref 83–108)

## 2022-08-11 LAB — GLUCOSE, CAPILLARY
Glucose-Capillary: 132 mg/dL — ABNORMAL HIGH (ref 70–99)
Glucose-Capillary: 113 mg/dL — ABNORMAL HIGH (ref 70–99)
Glucose-Capillary: 94 mg/dL (ref 70–99)
Glucose-Capillary: 100 mg/dL — ABNORMAL HIGH (ref 70–99)
Glucose-Capillary: 106 mg/dL — ABNORMAL HIGH (ref 70–99)
Glucose-Capillary: 116 mg/dL — ABNORMAL HIGH (ref 70–99)
Glucose-Capillary: 111 mg/dL — ABNORMAL HIGH (ref 70–99)
Glucose-Capillary: 124 mg/dL — ABNORMAL HIGH (ref 70–99)
Glucose-Capillary: 111 mg/dL — ABNORMAL HIGH (ref 70–99)
Glucose-Capillary: 146 mg/dL — ABNORMAL HIGH (ref 70–99)

## 2022-08-11 LAB — CBC
HCT: 30.3 % — ABNORMAL LOW (ref 36.0–46.0)
HCT: 33.6 % — ABNORMAL LOW (ref 36.0–46.0)
Hemoglobin: 10.1 g/dL — ABNORMAL LOW (ref 12.0–15.0)
Hemoglobin: 10.9 g/dL — ABNORMAL LOW (ref 12.0–15.0)
MCH: 31 pg (ref 26.0–34.0)
MCH: 30.4 pg (ref 26.0–34.0)
MCHC: 33.3 g/dL (ref 30.0–36.0)
MCHC: 32.4 g/dL (ref 30.0–36.0)
MCV: 92.9 fL (ref 80.0–100.0)
MCV: 93.9 fL (ref 80.0–100.0)
Platelets: 164 10*3/uL (ref 150–400)
Platelets: 208 10*3/uL (ref 150–400)
RBC: 3.26 MIL/uL — ABNORMAL LOW (ref 3.87–5.11)
RBC: 3.58 MIL/uL — ABNORMAL LOW (ref 3.87–5.11)
RDW: 13.3 % (ref 11.5–15.5)
RDW: 13.4 % (ref 11.5–15.5)
WBC: 14.3 10*3/uL — ABNORMAL HIGH (ref 4.0–10.5)
WBC: 17.9 10*3/uL — ABNORMAL HIGH (ref 4.0–10.5)
nRBC: 0 % (ref 0.0–0.2)
nRBC: 0 % (ref 0.0–0.2)

## 2022-08-11 LAB — POCT I-STAT, CHEM 8
BUN: 15 mg/dL (ref 6–20)
BUN: 15 mg/dL (ref 6–20)
BUN: 14 mg/dL (ref 6–20)
BUN: 12 mg/dL (ref 6–20)
Calcium, Ion: 1.18 mmol/L (ref 1.15–1.40)
Calcium, Ion: 1.17 mmol/L (ref 1.15–1.40)
Calcium, Ion: 1.08 mmol/L — ABNORMAL LOW (ref 1.15–1.40)
Calcium, Ion: 1.04 mmol/L — ABNORMAL LOW (ref 1.15–1.40)
Chloride: 104 mmol/L (ref 98–111)
Chloride: 105 mmol/L (ref 98–111)
Chloride: 104 mmol/L (ref 98–111)
Chloride: 104 mmol/L (ref 98–111)
Creatinine, Ser: 0.5 mg/dL (ref 0.44–1.00)
Creatinine, Ser: 0.5 mg/dL (ref 0.44–1.00)
Creatinine, Ser: 0.4 mg/dL — ABNORMAL LOW (ref 0.44–1.00)
Creatinine, Ser: 0.5 mg/dL (ref 0.44–1.00)
Glucose, Bld: 131 mg/dL — ABNORMAL HIGH (ref 70–99)
Glucose, Bld: 139 mg/dL — ABNORMAL HIGH (ref 70–99)
Glucose, Bld: 102 mg/dL — ABNORMAL HIGH (ref 70–99)
Glucose, Bld: 114 mg/dL — ABNORMAL HIGH (ref 70–99)
HCT: 36 % (ref 36.0–46.0)
HCT: 33 % — ABNORMAL LOW (ref 36.0–46.0)
HCT: 29 % — ABNORMAL LOW (ref 36.0–46.0)
HCT: 26 % — ABNORMAL LOW (ref 36.0–46.0)
Hemoglobin: 12.2 g/dL (ref 12.0–15.0)
Hemoglobin: 11.2 g/dL — ABNORMAL LOW (ref 12.0–15.0)
Hemoglobin: 9.9 g/dL — ABNORMAL LOW (ref 12.0–15.0)
Hemoglobin: 8.8 g/dL — ABNORMAL LOW (ref 12.0–15.0)
Potassium: 4 mmol/L (ref 3.5–5.1)
Potassium: 3.7 mmol/L (ref 3.5–5.1)
Potassium: 4 mmol/L (ref 3.5–5.1)
Potassium: 4.2 mmol/L (ref 3.5–5.1)
Sodium: 139 mmol/L (ref 135–145)
Sodium: 139 mmol/L (ref 135–145)
Sodium: 139 mmol/L (ref 135–145)
Sodium: 140 mmol/L (ref 135–145)
TCO2: 26 mmol/L (ref 22–32)
TCO2: 27 mmol/L (ref 22–32)
TCO2: 27 mmol/L (ref 22–32)
TCO2: 25 mmol/L (ref 22–32)

## 2022-08-11 LAB — ECHO INTRAOPERATIVE TEE
AV Mean grad: 30 mmHg
AV Peak grad: 46.2 mmHg
Ao pk vel: 3.4 m/s
Height: 65 in
Weight: 3840 oz

## 2022-08-11 LAB — POCT I-STAT EG7
Acid-base deficit: 1 mmol/L (ref 0.0–2.0)
Bicarbonate: 24.7 mmol/L (ref 20.0–28.0)
Calcium, Ion: 1.06 mmol/L — ABNORMAL LOW (ref 1.15–1.40)
HCT: 27 % — ABNORMAL LOW (ref 36.0–46.0)
Hemoglobin: 9.2 g/dL — ABNORMAL LOW (ref 12.0–15.0)
O2 Saturation: 79 %
Potassium: 5.2 mmol/L — ABNORMAL HIGH (ref 3.5–5.1)
Sodium: 140 mmol/L (ref 135–145)
TCO2: 26 mmol/L (ref 22–32)
pCO2, Ven: 44.6 mmHg (ref 44–60)
pH, Ven: 7.351 (ref 7.25–7.43)
pO2, Ven: 46 mmHg — ABNORMAL HIGH (ref 32–45)

## 2022-08-11 LAB — HEMOGLOBIN AND HEMATOCRIT, BLOOD
HCT: 30.1 % — ABNORMAL LOW (ref 36.0–46.0)
Hemoglobin: 10.2 g/dL — ABNORMAL LOW (ref 12.0–15.0)

## 2022-08-11 LAB — APTT: aPTT: 33 seconds (ref 24–36)

## 2022-08-11 LAB — MAGNESIUM: Magnesium: 3.2 mg/dL — ABNORMAL HIGH (ref 1.7–2.4)

## 2022-08-11 LAB — PROTIME-INR
INR: 1.3 — ABNORMAL HIGH (ref 0.8–1.2)
Prothrombin Time: 16.5 seconds — ABNORMAL HIGH (ref 11.4–15.2)

## 2022-08-11 LAB — PLATELET COUNT: Platelets: 194 10*3/uL (ref 150–400)

## 2022-08-11 LAB — ABO/RH: ABO/RH(D): O POS

## 2022-08-11 SURGERY — REPLACEMENT, AORTIC VALVE, OPEN
Anesthesia: General | Site: Chest

## 2022-08-11 MED ORDER — DOCUSATE SODIUM 100 MG PO CAPS
200.0000 mg | ORAL_CAPSULE | Freq: Every day | ORAL | Status: DC
  Administered 2022-08-12 – 2022-08-13 (×2): 200 mg via ORAL
  Filled 2022-08-11 (×2): qty 2

## 2022-08-11 MED ORDER — CHLORHEXIDINE GLUCONATE 0.12 % MT SOLN
OROMUCOSAL | Status: AC
  Administered 2022-08-11: 15 mL via OROMUCOSAL
  Filled 2022-08-11: qty 15

## 2022-08-11 MED ORDER — SODIUM CHLORIDE 0.9% FLUSH
3.0000 mL | Freq: Two times a day (BID) | INTRAVENOUS | Status: DC
  Administered 2022-08-12 (×2): 3 mL via INTRAVENOUS

## 2022-08-11 MED ORDER — ~~LOC~~ CARDIAC SURGERY, PATIENT & FAMILY EDUCATION
Freq: Once | Status: DC
  Filled 2022-08-11: qty 1

## 2022-08-11 MED ORDER — NORETHINDRONE 0.35 MG PO TABS: 1.0000 | ORAL_TABLET | Freq: Every day | ORAL | Status: DC

## 2022-08-11 MED ORDER — HEPARIN SODIUM (PORCINE) 1000 UNIT/ML IJ SOLN
INTRAMUSCULAR | Status: AC
  Filled 2022-08-11: qty 1

## 2022-08-11 MED ORDER — PROTAMINE SULFATE 10 MG/ML IV SOLN
INTRAVENOUS | Status: DC | PRN
  Administered 2022-08-11: 320 mg via INTRAVENOUS

## 2022-08-11 MED ORDER — LACTATED RINGERS IV SOLN: 500.0000 mL | Freq: Once | INTRAVENOUS | Status: DC | PRN

## 2022-08-11 MED ORDER — THROMBIN 20000 UNITS EX SOLR
OROMUCOSAL | Status: DC | PRN
  Administered 2022-08-11 (×4): 4 mL via TOPICAL

## 2022-08-11 MED ORDER — CHLORHEXIDINE GLUCONATE 0.12 % MT SOLN
15.0000 mL | OROMUCOSAL | Status: AC
  Administered 2022-08-11: 15 mL via OROMUCOSAL
  Filled 2022-08-11: qty 15

## 2022-08-11 MED ORDER — ACETAMINOPHEN 160 MG/5ML PO SOLN: 650.0000 mg | Freq: Once | ORAL | Status: AC

## 2022-08-11 MED ORDER — ALBUMIN HUMAN 5 % IV SOLN: INTRAVENOUS | Status: DC | PRN

## 2022-08-11 MED ORDER — PROTAMINE SULFATE 10 MG/ML IV SOLN
INTRAVENOUS | Status: AC
  Filled 2022-08-11: qty 10

## 2022-08-11 MED ORDER — METOPROLOL TARTRATE 12.5 MG HALF TABLET
12.5000 mg | ORAL_TABLET | Freq: Once | ORAL | Status: AC
  Administered 2022-08-11: 12.5 mg via ORAL
  Filled 2022-08-11: qty 1

## 2022-08-11 MED ORDER — ETOMIDATE 2 MG/ML IV SOLN
INTRAVENOUS | Status: DC | PRN
  Administered 2022-08-11: 16 mg via INTRAVENOUS

## 2022-08-11 MED ORDER — GABAPENTIN 300 MG PO CAPS
600.0000 mg | ORAL_CAPSULE | Freq: Every day | ORAL | Status: DC
  Administered 2022-08-11 – 2022-08-16 (×6): 600 mg via ORAL
  Filled 2022-08-11 (×6): qty 2

## 2022-08-11 MED ORDER — METOPROLOL TARTRATE 5 MG/5ML IV SOLN
2.5000 mg | INTRAVENOUS | Status: DC | PRN
  Administered 2022-08-12: 2.5 mg via INTRAVENOUS
  Filled 2022-08-11: qty 5

## 2022-08-11 MED ORDER — ACETAMINOPHEN 650 MG RE SUPP
650.0000 mg | Freq: Once | RECTAL | Status: AC
  Administered 2022-08-11: 650 mg via RECTAL

## 2022-08-11 MED ORDER — BISACODYL 10 MG RE SUPP: 10.0000 mg | Freq: Every day | RECTAL | Status: DC

## 2022-08-11 MED ORDER — SODIUM CHLORIDE 0.45 % IV SOLN: INTRAVENOUS | Status: DC | PRN

## 2022-08-11 MED ORDER — ASPIRIN 81 MG PO CHEW: 324.0000 mg | CHEWABLE_TABLET | Freq: Every day | ORAL | Status: DC

## 2022-08-11 MED ORDER — DULOXETINE HCL 60 MG PO CPEP: 60.0000 mg | ORAL_CAPSULE | Freq: Every day | ORAL | Status: DC | PRN

## 2022-08-11 MED ORDER — SODIUM CHLORIDE 0.9% FLUSH: 3.0000 mL | INTRAVENOUS | Status: DC | PRN

## 2022-08-11 MED ORDER — PHENYLEPHRINE 80 MCG/ML (10ML) SYRINGE FOR IV PUSH (FOR BLOOD PRESSURE SUPPORT)
PREFILLED_SYRINGE | INTRAVENOUS | Status: DC | PRN
  Administered 2022-08-11 (×2): 80 ug via INTRAVENOUS
  Administered 2022-08-11: 40 ug via INTRAVENOUS
  Administered 2022-08-11 (×3): 80 ug via INTRAVENOUS

## 2022-08-11 MED ORDER — BISACODYL 5 MG PO TBEC
10.0000 mg | DELAYED_RELEASE_TABLET | Freq: Every day | ORAL | Status: DC
  Administered 2022-08-12 – 2022-08-13 (×2): 10 mg via ORAL
  Filled 2022-08-11 (×2): qty 2

## 2022-08-11 MED ORDER — SODIUM CHLORIDE 0.9 % IV SOLN: INTRAVENOUS | Status: DC

## 2022-08-11 MED ORDER — FENTANYL CITRATE (PF) 250 MCG/5ML IJ SOLN
INTRAMUSCULAR | Status: AC
  Filled 2022-08-11: qty 5

## 2022-08-11 MED ORDER — ATORVASTATIN CALCIUM 80 MG PO TABS
80.0000 mg | ORAL_TABLET | Freq: Every day | ORAL | Status: DC
  Administered 2022-08-12 – 2022-08-17 (×6): 80 mg via ORAL
  Filled 2022-08-11 (×6): qty 1

## 2022-08-11 MED ORDER — ONDANSETRON HCL 4 MG/2ML IJ SOLN
4.0000 mg | Freq: Four times a day (QID) | INTRAMUSCULAR | Status: DC | PRN
  Administered 2022-08-11 – 2022-08-12 (×4): 4 mg via INTRAVENOUS
  Filled 2022-08-11 (×4): qty 2

## 2022-08-11 MED ORDER — CALCIUM CHLORIDE 10 % IV SOLN
INTRAVENOUS | Status: DC | PRN
  Administered 2022-08-11 (×2): 200 mg via INTRAVENOUS

## 2022-08-11 MED ORDER — ROCURONIUM BROMIDE 10 MG/ML (PF) SYRINGE
PREFILLED_SYRINGE | INTRAVENOUS | Status: AC
  Filled 2022-08-11: qty 10

## 2022-08-11 MED ORDER — ORAL CARE MOUTH RINSE: 15.0000 mL | Freq: Once | OROMUCOSAL | Status: AC

## 2022-08-11 MED ORDER — ALPRAZOLAM 0.5 MG PO TABS
0.5000 mg | ORAL_TABLET | Freq: Two times a day (BID) | ORAL | Status: DC | PRN
  Administered 2022-08-14: 0.5 mg via ORAL
  Filled 2022-08-11: qty 1

## 2022-08-11 MED ORDER — POTASSIUM CHLORIDE 10 MEQ/50ML IV SOLN
10.0000 meq | INTRAVENOUS | Status: AC
  Administered 2022-08-11 (×3): 10 meq via INTRAVENOUS

## 2022-08-11 MED ORDER — LACTATED RINGERS IV SOLN: INTRAVENOUS | Status: DC | PRN

## 2022-08-11 MED ORDER — MIDAZOLAM HCL 2 MG/2ML IJ SOLN: 2.0000 mg | INTRAMUSCULAR | Status: DC | PRN

## 2022-08-11 MED ORDER — MIDAZOLAM HCL (PF) 5 MG/ML IJ SOLN
INTRAMUSCULAR | Status: DC | PRN
  Administered 2022-08-11 (×2): 1 mg via INTRAVENOUS
  Administered 2022-08-11: 2 mg via INTRAVENOUS
  Administered 2022-08-11: 1 mg via INTRAVENOUS
  Administered 2022-08-11 (×2): 2 mg via INTRAVENOUS
  Administered 2022-08-11: 1 mg via INTRAVENOUS
  Administered 2022-08-11: 2 mg via INTRAVENOUS

## 2022-08-11 MED ORDER — PROPOFOL 10 MG/ML IV BOLUS
INTRAVENOUS | Status: DC | PRN
  Administered 2022-08-11: 20 mg via INTRAVENOUS
  Administered 2022-08-11: 40 mg via INTRAVENOUS

## 2022-08-11 MED ORDER — MAGNESIUM SULFATE 4 GM/100ML IV SOLN
4.0000 g | Freq: Once | INTRAVENOUS | Status: AC
  Administered 2022-08-11: 4 g via INTRAVENOUS
  Filled 2022-08-11: qty 100

## 2022-08-11 MED ORDER — FAMOTIDINE IN NACL 20-0.9 MG/50ML-% IV SOLN
20.0000 mg | Freq: Two times a day (BID) | INTRAVENOUS | Status: AC
  Administered 2022-08-11: 20 mg via INTRAVENOUS
  Filled 2022-08-11: qty 50

## 2022-08-11 MED ORDER — NITROGLYCERIN IN D5W 200-5 MCG/ML-% IV SOLN: 0.0000 ug/min | INTRAVENOUS | Status: DC

## 2022-08-11 MED ORDER — DEXMEDETOMIDINE HCL IN NACL 400 MCG/100ML IV SOLN
0.0000 ug/kg/h | INTRAVENOUS | Status: DC
  Administered 2022-08-11: 0.7 ug/kg/h via INTRAVENOUS
  Filled 2022-08-11: qty 100

## 2022-08-11 MED ORDER — ACETAMINOPHEN 160 MG/5ML PO SOLN: 1000.0000 mg | Freq: Four times a day (QID) | ORAL | Status: AC

## 2022-08-11 MED ORDER — FENTANYL CITRATE (PF) 250 MCG/5ML IJ SOLN
INTRAMUSCULAR | Status: DC | PRN
  Administered 2022-08-11: 200 ug via INTRAVENOUS
  Administered 2022-08-11 (×3): 100 ug via INTRAVENOUS
  Administered 2022-08-11 (×3): 50 ug via INTRAVENOUS
  Administered 2022-08-11: 150 ug via INTRAVENOUS
  Administered 2022-08-11: 50 ug via INTRAVENOUS
  Administered 2022-08-11: 100 ug via INTRAVENOUS
  Administered 2022-08-11: 50 ug via INTRAVENOUS
  Administered 2022-08-11: 100 ug via INTRAVENOUS

## 2022-08-11 MED ORDER — ALBUMIN HUMAN 5 % IV SOLN
250.0000 mL | INTRAVENOUS | Status: DC | PRN
  Administered 2022-08-11 (×2): 12.5 g via INTRAVENOUS
  Filled 2022-08-11: qty 250

## 2022-08-11 MED ORDER — ROCURONIUM BROMIDE 10 MG/ML (PF) SYRINGE
PREFILLED_SYRINGE | INTRAVENOUS | Status: DC | PRN
  Administered 2022-08-11: 80 mg via INTRAVENOUS
  Administered 2022-08-11: 30 mg via INTRAVENOUS
  Administered 2022-08-11 (×2): 20 mg via INTRAVENOUS

## 2022-08-11 MED ORDER — INSULIN REGULAR(HUMAN) IN NACL 100-0.9 UT/100ML-% IV SOLN: INTRAVENOUS | Status: DC

## 2022-08-11 MED ORDER — LACTATED RINGERS IV SOLN: INTRAVENOUS | Status: DC

## 2022-08-11 MED ORDER — DEXTROSE 50 % IV SOLN: 0.0000 mL | INTRAVENOUS | Status: DC | PRN

## 2022-08-11 MED ORDER — CHLORHEXIDINE GLUCONATE 0.12 % MT SOLN: 15.0000 mL | Freq: Once | OROMUCOSAL | Status: DC

## 2022-08-11 MED ORDER — VANCOMYCIN HCL IN DEXTROSE 1-5 GM/200ML-% IV SOLN
1000.0000 mg | Freq: Once | INTRAVENOUS | Status: AC
  Administered 2022-08-11: 1000 mg via INTRAVENOUS
  Filled 2022-08-11: qty 200

## 2022-08-11 MED ORDER — PROPOFOL 10 MG/ML IV BOLUS
INTRAVENOUS | Status: AC
  Filled 2022-08-11: qty 20

## 2022-08-11 MED ORDER — PHENYLEPHRINE HCL-NACL 20-0.9 MG/250ML-% IV SOLN: 0.0000 ug/min | INTRAVENOUS | Status: DC

## 2022-08-11 MED ORDER — PROTAMINE SULFATE 10 MG/ML IV SOLN
INTRAVENOUS | Status: AC
  Filled 2022-08-11: qty 25

## 2022-08-11 MED ORDER — TRAMADOL HCL 50 MG PO TABS
50.0000 mg | ORAL_TABLET | ORAL | Status: DC | PRN
  Administered 2022-08-11: 50 mg via ORAL
  Filled 2022-08-11: qty 1

## 2022-08-11 MED ORDER — SODIUM CHLORIDE 0.9 % IV SOLN: 250.0000 mL | INTRAVENOUS | Status: DC

## 2022-08-11 MED ORDER — OXYCODONE HCL 5 MG PO TABS
5.0000 mg | ORAL_TABLET | ORAL | Status: DC | PRN
  Administered 2022-08-11: 5 mg via ORAL
  Administered 2022-08-12 (×2): 10 mg via ORAL
  Filled 2022-08-11: qty 2
  Filled 2022-08-11: qty 1
  Filled 2022-08-11: qty 2

## 2022-08-11 MED ORDER — MIDAZOLAM HCL 2 MG/2ML IJ SOLN
INTRAMUSCULAR | Status: AC
  Filled 2022-08-11: qty 2

## 2022-08-11 MED ORDER — MORPHINE SULFATE (PF) 2 MG/ML IV SOLN
1.0000 mg | INTRAVENOUS | Status: DC | PRN
  Administered 2022-08-11: 1 mg via INTRAVENOUS
  Administered 2022-08-11 (×2): 2 mg via INTRAVENOUS
  Administered 2022-08-12: 4 mg via INTRAVENOUS
  Filled 2022-08-11: qty 1
  Filled 2022-08-11: qty 2
  Filled 2022-08-11 (×2): qty 1

## 2022-08-11 MED ORDER — PROGESTERONE MICRONIZED 100 MG PO CAPS
100.0000 mg | ORAL_CAPSULE | Freq: Every day | ORAL | Status: DC
  Administered 2022-08-16 – 2022-08-17 (×2): 100 mg via ORAL
  Filled 2022-08-11 (×2): qty 1

## 2022-08-11 MED ORDER — HEMOSTATIC AGENTS (NO CHARGE) OPTIME
TOPICAL | Status: DC | PRN
  Administered 2022-08-11: 1 via TOPICAL

## 2022-08-11 MED ORDER — CHLORHEXIDINE GLUCONATE CLOTH 2 % EX PADS
6.0000 | MEDICATED_PAD | Freq: Every day | CUTANEOUS | Status: DC
  Administered 2022-08-12 – 2022-08-13 (×2): 6 via TOPICAL

## 2022-08-11 MED ORDER — PLASMA-LYTE A IV SOLN: INTRAVENOUS | Status: DC | PRN

## 2022-08-11 MED ORDER — ASPIRIN 325 MG PO TBEC
325.0000 mg | DELAYED_RELEASE_TABLET | Freq: Every day | ORAL | Status: DC
  Administered 2022-08-12 – 2022-08-13 (×2): 325 mg via ORAL
  Filled 2022-08-11 (×2): qty 1

## 2022-08-11 MED ORDER — HEPARIN SODIUM (PORCINE) 1000 UNIT/ML IJ SOLN
INTRAMUSCULAR | Status: DC | PRN
  Administered 2022-08-11: 35000 [IU] via INTRAVENOUS

## 2022-08-11 MED ORDER — 0.9 % SODIUM CHLORIDE (POUR BTL) OPTIME
TOPICAL | Status: DC | PRN
  Administered 2022-08-11: 5000 mL

## 2022-08-11 MED ORDER — ALLOPURINOL 300 MG PO TABS
300.0000 mg | ORAL_TABLET | Freq: Every day | ORAL | Status: DC
  Administered 2022-08-12 – 2022-08-17 (×6): 300 mg via ORAL
  Filled 2022-08-11 (×6): qty 1

## 2022-08-11 MED ORDER — CEFAZOLIN SODIUM-DEXTROSE 2-4 GM/100ML-% IV SOLN
2.0000 g | Freq: Three times a day (TID) | INTRAVENOUS | Status: AC
  Administered 2022-08-11 – 2022-08-13 (×6): 2 g via INTRAVENOUS
  Filled 2022-08-11 (×6): qty 100

## 2022-08-11 MED ORDER — SODIUM BICARBONATE 8.4 % IV SOLN
50.0000 meq | Freq: Once | INTRAVENOUS | Status: AC
  Administered 2022-08-11: 50 meq via INTRAVENOUS

## 2022-08-11 MED ORDER — PANTOPRAZOLE SODIUM 40 MG PO TBEC
40.0000 mg | DELAYED_RELEASE_TABLET | Freq: Every day | ORAL | Status: DC
  Administered 2022-08-13 – 2022-08-17 (×5): 40 mg via ORAL
  Filled 2022-08-11 (×5): qty 1

## 2022-08-11 MED ORDER — THROMBIN (RECOMBINANT) 20000 UNITS EX SOLR
CUTANEOUS | Status: AC
  Filled 2022-08-11: qty 20000

## 2022-08-11 MED ORDER — SODIUM CHLORIDE 0.9 % IV SOLN: INTRAVENOUS | Status: DC | PRN

## 2022-08-11 MED ORDER — CHLORHEXIDINE GLUCONATE 4 % EX LIQD: 30.0000 mL | CUTANEOUS | Status: DC

## 2022-08-11 MED ORDER — MIDAZOLAM HCL (PF) 10 MG/2ML IJ SOLN
INTRAMUSCULAR | Status: AC
  Filled 2022-08-11: qty 2

## 2022-08-11 MED ORDER — CHLORHEXIDINE GLUCONATE 0.12 % MT SOLN: 15.0000 mL | Freq: Once | OROMUCOSAL | Status: AC

## 2022-08-11 MED ORDER — ETOMIDATE 2 MG/ML IV SOLN
INTRAVENOUS | Status: AC
  Filled 2022-08-11: qty 10

## 2022-08-11 MED ORDER — GABAPENTIN 300 MG PO CAPS
300.0000 mg | ORAL_CAPSULE | Freq: Two times a day (BID) | ORAL | Status: DC
  Administered 2022-08-12 – 2022-08-17 (×10): 300 mg via ORAL
  Filled 2022-08-11 (×10): qty 1

## 2022-08-11 MED ORDER — ACETAMINOPHEN 500 MG PO TABS
1000.0000 mg | ORAL_TABLET | Freq: Four times a day (QID) | ORAL | Status: AC
  Administered 2022-08-12 – 2022-08-16 (×13): 1000 mg via ORAL
  Filled 2022-08-11 (×13): qty 2

## 2022-08-11 SURGICAL SUPPLY — 86 items
ADAPTER CARDIO PERF ANTE/RETRO (ADAPTER) ×3 IMPLANT
ADPR PRFSN 84XANTGRD RTRGD (ADAPTER) ×2
ADPR TBG 2 MALE LL ART (MISCELLANEOUS) ×2
BAG DECANTER FOR FLEXI CONT (MISCELLANEOUS) ×3 IMPLANT
BLADE CLIPPER SURG (BLADE) ×3 IMPLANT
BLADE STERNUM SYSTEM 6 (BLADE) ×3 IMPLANT
BLADE SURG 15 STRL LF DISP TIS (BLADE) ×3 IMPLANT
BLADE SURG 15 STRL SS (BLADE) ×2
CANISTER SUCT 3000ML PPV (MISCELLANEOUS) ×3 IMPLANT
CANNULA ARTERIAL VENT 3/8 20FR (CANNULA) IMPLANT
CANNULA GUNDRY RCSP 15FR (MISCELLANEOUS) ×3 IMPLANT
CANNULA MC2 2 STG 36/46 NON-V (CANNULA) IMPLANT
CANNULA VENOUS 2 STG 34/46 (CANNULA) ×2
CATH HEART VENT LEFT (CATHETERS) ×3 IMPLANT
CATH ROBINSON RED A/P 18FR (CATHETERS) ×9 IMPLANT
CATH THORACIC 36FR (CATHETERS) ×3 IMPLANT
CATH THORACIC 36FR RT ANG (CATHETERS) ×3 IMPLANT
CNTNR URN SCR LID CUP LEK RST (MISCELLANEOUS) ×3 IMPLANT
CONN Y 3/8X3/8X3/8  BEN (MISCELLANEOUS) ×4
CONN Y 3/8X3/8X3/8 BEN (MISCELLANEOUS) IMPLANT
CONT SPEC 4OZ STRL OR WHT (MISCELLANEOUS) ×4
CONTAINER PROTECT SURGISLUSH (MISCELLANEOUS) ×6 IMPLANT
COVER SURGICAL LIGHT HANDLE (MISCELLANEOUS) ×3 IMPLANT
DEVICE SUT CK QUICK LOAD MINI (Prosthesis & Implant Heart) IMPLANT
DRAPE CARDIOVASCULAR INCISE (DRAPES) ×2
DRAPE SRG 135X102X78XABS (DRAPES) ×3 IMPLANT
DRAPE WARM FLUID 44X44 (DRAPES) ×3 IMPLANT
DRSG COVADERM 4X14 (GAUZE/BANDAGES/DRESSINGS) ×3 IMPLANT
ELECT CAUTERY BLADE 6.4 (BLADE) ×3 IMPLANT
ELECT REM PT RETURN 9FT ADLT (ELECTROSURGICAL) ×4
ELECTRODE REM PT RTRN 9FT ADLT (ELECTROSURGICAL) ×6 IMPLANT
FELT TEFLON 1X6 (MISCELLANEOUS) ×6 IMPLANT
GAUZE 4X4 16PLY ~~LOC~~+RFID DBL (SPONGE) ×3 IMPLANT
GAUZE SPONGE 4X4 12PLY STRL (GAUZE/BANDAGES/DRESSINGS) ×3 IMPLANT
GLOVE BIO SURGEON STRL SZ 6 (GLOVE) IMPLANT
GLOVE BIO SURGEON STRL SZ 6.5 (GLOVE) IMPLANT
GLOVE BIO SURGEON STRL SZ7 (GLOVE) IMPLANT
GLOVE BIO SURGEON STRL SZ7.5 (GLOVE) IMPLANT
GLOVE SURG MICRO LTX SZ7 (GLOVE) ×6 IMPLANT
GLOVE SURG SS PI 7.5 STRL IVOR (GLOVE) IMPLANT
GOWN STRL REUS W/ TWL LRG LVL3 (GOWN DISPOSABLE) ×12 IMPLANT
GOWN STRL REUS W/ TWL XL LVL3 (GOWN DISPOSABLE) ×3 IMPLANT
GOWN STRL REUS W/TWL LRG LVL3 (GOWN DISPOSABLE) ×6
GOWN STRL REUS W/TWL XL LVL3 (GOWN DISPOSABLE) ×2
HEMOSTAT POWDER SURGIFOAM 1G (HEMOSTASIS) ×9 IMPLANT
HEMOSTAT SURGICEL 2X14 (HEMOSTASIS) ×3 IMPLANT
IV ADAPTER SYR DOUBLE MALE LL (MISCELLANEOUS) IMPLANT
KIT BASIN OR (CUSTOM PROCEDURE TRAY) ×3 IMPLANT
KIT CATH CPB BARTLE (MISCELLANEOUS) ×3 IMPLANT
KIT SUCTION CATH 14FR (SUCTIONS) ×3 IMPLANT
KIT SUT CK MINI COMBO 4X17 (Prosthesis & Implant Heart) IMPLANT
KIT TURNOVER KIT B (KITS) ×3 IMPLANT
LINE VENT (MISCELLANEOUS) IMPLANT
NS IRRIG 1000ML POUR BTL (IV SOLUTION) ×18 IMPLANT
PACK E OPEN HEART (SUTURE) ×3 IMPLANT
PACK OPEN HEART (CUSTOM PROCEDURE TRAY) ×3 IMPLANT
PAD ARMBOARD 7.5X6 YLW CONV (MISCELLANEOUS) ×6 IMPLANT
POSITIONER HEAD DONUT 9IN (MISCELLANEOUS) ×3 IMPLANT
SET MPS 3-ND DEL (MISCELLANEOUS) IMPLANT
SPONGE T-LAP 18X18 ~~LOC~~+RFID (SPONGE) ×12 IMPLANT
SPONGE T-LAP 4X18 ~~LOC~~+RFID (SPONGE) ×3 IMPLANT
STOPCOCK 4 WAY LG BORE MALE ST (IV SETS) IMPLANT
SUT BONE WAX W31G (SUTURE) ×3 IMPLANT
SUT EB EXC GRN/WHT 2-0 V-5 (SUTURE) ×6 IMPLANT
SUT ETHIBOND 2-0 30 1/2 V-5 (SUTURE) IMPLANT
SUT ETHIBOND V-5 VALVE (SUTURE) IMPLANT
SUT PROLENE 3 0 SH DA (SUTURE) IMPLANT
SUT PROLENE 3 0 SH1 36 (SUTURE) ×3 IMPLANT
SUT PROLENE 4 0 RB 1 (SUTURE) ×6
SUT PROLENE 4-0 RB1 .5 CRCL 36 (SUTURE) ×9 IMPLANT
SUT STEEL 6MS V (SUTURE) IMPLANT
SUT STEEL SZ 6 DBL 3X14 BALL (SUTURE) IMPLANT
SUT VIC AB 1 CTX 36 (SUTURE) ×4
SUT VIC AB 1 CTX36XBRD ANBCTR (SUTURE) ×6 IMPLANT
SYR BULB IRRIG 60ML STRL (SYRINGE) IMPLANT
SYSTEM SAHARA CHEST DRAIN ATS (WOUND CARE) ×3 IMPLANT
TAPE CLOTH SURG 4X10 WHT LF (GAUZE/BANDAGES/DRESSINGS) IMPLANT
TAPE PAPER 2X10 WHT MICROPORE (GAUZE/BANDAGES/DRESSINGS) IMPLANT
TOWEL GREEN STERILE (TOWEL DISPOSABLE) ×3 IMPLANT
TOWEL GREEN STERILE FF (TOWEL DISPOSABLE) ×3 IMPLANT
TRAY FOLEY SLVR 14FR TEMP STAT (SET/KITS/TRAYS/PACK) IMPLANT
TRAY FOLEY SLVR 16FR TEMP STAT (SET/KITS/TRAYS/PACK) ×3 IMPLANT
UNDERPAD 30X36 HEAVY ABSORB (UNDERPADS AND DIAPERS) ×3 IMPLANT
VALVE AORTIC SZ23 INSP/RESIL (Prosthesis & Implant Heart) IMPLANT
VENT LEFT HEART 12002 (CATHETERS) ×2
WATER STERILE IRR 1000ML POUR (IV SOLUTION) ×6 IMPLANT

## 2022-08-11 NOTE — Progress Notes (Signed)
EVENING ROUNDS NOTE :     [address]       [place] [address]             [phone]                 Day of Surgery Procedure(s) (LRB): AORTIC VALVE REPLACEMENT (AVR) USING INSPIRIS SIZE 23MM (N/A) TRANSESOPHAGEAL ECHOCARDIOGRAM (N/A)   Total Length of Stay:  LOS: 0 days  Events:   Extubated Good hemodynamics Minimal CT output    BP 127/81   Pulse 88   Temp (!) 97.5 F (36.4 C)   Resp 13   Ht '5\' 5"'$  (1.651 m)   Wt 108.9 kg   LMP 10/07/2012   SpO2 97%   BMI 39.94 kg/m   PAP: (16-26)/(8-13) 20/9 CO:  [3.9 L/min-5.8 L/min] 5.1 L/min CI:  [1.8 L/min/m2-2.7 L/min/m2] 2.4 L/min/m2  Vent Mode: PSV;CPAP FiO2 (%):  [40 %-50 %] 40 % Set Rate:  [4 bmp-16 bmp] 4 bmp Vt Set:  [450 mL] 450 mL PEEP:  [5 cmH20] 5 cmH20 Pressure Support:  [10 cmH20] 10 cmH20 Plateau Pressure:  [14 cmH20-17 cmH20] 14 cmH20   sodium chloride     [START ON 08/12/2022] sodium chloride     sodium chloride 20 mL/hr at 08/11/22 1600   albumin human 60 mL/hr at 08/11/22 1600    ceFAZolin (ANCEF) IV Stopped (08/11/22 1226)   dexmedetomidine (PRECEDEX) IV infusion Stopped (08/11/22 1320)   famotidine (PEPCID) IV Stopped (08/11/22 1303)   insulin 1.1 Units/hr (08/11/22 1600)   lactated ringers     lactated ringers     lactated ringers 20 mL/hr at 08/11/22 1600   magnesium sulfate 20 mL/hr at 08/11/22 1600   nitroGLYCERIN     phenylephrine (NEO-SYNEPHRINE) Adult infusion 25 mcg/min (08/11/22 1600)   vancomycin      No intake/output data recorded.      Latest Ref Rng & Units 08/11/2022   11:52 AM 08/11/2022   11:51 AM 08/11/2022   10:34 AM  CBC  WBC 4.0 - 10.5 K/uL 14.3     Hemoglobin 12.0 - 15.0 g/dL 10.1  10.5  8.8   Hematocrit 36.0 - 46.0 % 30.3  31.0  26.0   Platelets 150 - 400 K/uL 164          Latest Ref Rng & Units 08/11/2022   11:51 AM 08/11/2022   10:34 AM 08/11/2022   10:11 AM  BMP  Glucose 70 - 99 mg/dL  114    BUN 6 - 20 mg/dL  12    Creatinine 0.44 - 1.00  mg/dL  0.50    Sodium 135 - 145 mmol/L 141  140  138   Potassium 3.5 - 5.1 mmol/L 3.8  4.2  5.0   Chloride 98 - 111 mmol/L  104      ABG    Component Value Date/Time   PHART 7.299 (L) 08/11/2022 1151   PCO2ART 46.7 08/11/2022 1151   PO2ART 92 08/11/2022 1151   HCO3 23.3 08/11/2022 1151   TCO2 25 08/11/2022 1151   ACIDBASEDEF 4.0 (H) 08/11/2022 1151   O2SAT 97 08/11/2022 1151       Melodie Bouillon, MD 08/11/2022 5:00 PM

## 2022-08-11 NOTE — Op Note (Signed)
CARDIOVASCULAR SURGERY OPERATIVE NOTE  08/11/2022 Nancy Oliver [id number]  Surgeon:  Gaye Pollack, MD  First Assistant: Enid Cutter,  PA-C   Preoperative Diagnosis:  Bicuspid aortic valve with severe aortic stenosis   Postoperative Diagnosis:  Same   Procedure:  Median Sternotomy Extracorporeal circulation 3.   Aortic valve replacement using a 23 mm Edwards INSPIRIS RESILIA pericardial valve.  Anesthesia:  General Endotracheal   Clinical History/Surgical Indication:  This 62 year old woman has stage D, severe, symptomatic aortic stenosis with NYHA class III symptoms of exertional fatigue and shortness of breath consistent with chronic diastolic congestive heart failure.  She has also been having episodes of dizziness with standing up or bending over as well as lower extremity edema.  I agree that aortic valve replacement is indicated in this patient for relief of her symptoms and to prevent left ventricular dysfunction.  Given her young age I do not think she would be a good candidate for TAVR and I would recommend open surgical aortic valve replacement.  We discussed the alternatives of mechanical and bioprosthetic valves and she would like to use a bioprosthetic valve so she does not need to be on Coumadin.  I think that is a reasonable alternative for her. CTA of the chest shows no aortic aneurysm.   I discussed the operative procedure with the patient and her daughter including alternatives, benefits and risks; including but not limited to bleeding, blood transfusion, infection, stroke, myocardial infarction, graft failure, heart block requiring a permanent pacemaker, organ dysfunction, and death.  Nancy Oliver understands and agrees to proceed.    Preparation:  The patient was seen in the preoperative holding area and the correct patient, correct operation were confirmed with the patient after reviewing the medical record and catheterization. The consent was signed  by me. Preoperative antibiotics were given. A pulmonary arterial line and radial arterial line were placed by the anesthesia team. The patient was taken back to the operating room and positioned supine on the operating room table. After being placed under general endotracheal anesthesia by the anesthesia team a foley catheter was placed. The neck, chest, abdomen, and both legs were prepped with betadine soap and solution and draped in the usual sterile manner. A surgical time-out was taken and the correct patient and operative procedure were confirmed with the nursing and anesthesia staff.   Pre-bypass TEE:   Complete TEE assessment was performed by Dr. Myrtie Soman. This showed a bicuspid aortic valve with severe stenosis and trivial AI. LV systolic function normal    Post-bypass TEE:   Normal functioning prosthetic aortic valve with no perivalvular leak or regurgitation through the valve. Left ventricular function preserved. Unchanged trivial mitral regurgitation.    Cardiopulmonary Bypass:  A median sternotomy was performed. The pericardium was opened in the midline. Right ventricular function appeared normal. The ascending aorta was of normal size and had no palpable plaque. There were no contraindications to aortic cannulation or cross-clamping. The patient was fully systemically heparinized and the ACT was maintained > 400 sec. The proximal aortic arch was cannulated with a 20 F aortic cannula for arterial inflow. Venous cannulation was performed via the right atrial appendage using a two-staged venous cannula. An antegrade cardioplegia/vent cannula was inserted into the mid-ascending aorta. A left ventricular vent was placed via the right superior pulmonary vein. A retrograde cardioplegia cannnula was placed into the coronary sinus via the right atrium. Aortic occlusion was performed with a single cross-clamp. Systemic cooling to 32 degrees Centigrade  and topical cooling of the heart with  iced saline were used. 1400 cc of cold antegrade KBC cardioplegia was used to induce diastolic arrest. A temperature probe was inserted into the interventricular septum and an insulating pad was placed in the pericardium. Carbon dioxide was insufflated into the pericardium at 5L/min throughout the procedure to minimize intracardiac air.   Aortic Valve Replacement:   A transverse aortotomy was performed 1 cm above the take-off of the right coronary artery. The native valve was bicuspid with fusion of the left and right cusps with a single raphe.  The leaflets were moderately calcified and mild annular calcification.  The ostia of the coronary arteries were in normal position and were not obstructed. The native valve leaflets were excised and the annulus was decalcified with rongeurs. Care was taken to remove all particulate debris. The left ventricle was directly inspected for debris and then irrigated with ice saline solution. The annulus was sized and a size 37m valve was chosen. The model number was [id number] and the serial number was [id number] While the valve was being prepared 2-0 Ethibond pledgeted horizontal mattress sutures were placed around the annulus with the pledgets in a sub-annular position. The sutures were placed through the sewing ring and the valve lowered into place. The sutures were tied using the CorKnot system. The valve seated nicely and the coronary ostia were not obstructed. The prosthetic valve leaflets moved normally and there was no sub-valvular obstruction. The aortotomy was closed using 4-0 Prolene suture in 2 layers with felt strips to reinforce the closure.  Completion:  The patient was rewarmed to 37 degrees Centigrade. A dose of warm retrograde reanimation cardioplegia was given. De-airing maneuvers were performed and the head placed in trendelenburg position. The crossclamp was removed with a time of 60 minutes. There was spontaneous return of sinus rhythm. The aortotomy was  checked for hemostasis. Two temporary epicardial pacing wires were placed on the right atrium and two on the right ventricle. The left ventricular vent and retrograde cardioplegia cannulas were removed. The patient was weaned from CPB without difficulty on no inotropes. CPB time was 81 minutes. Cardiac output was 5 LPM. Heparin was fully reversed with protamine and the aortic and venous cannulas removed. Hemostasis was achieved. Mediastinal drainage tubes were placed. The sternum was closed with single and double #6 stainless steel wires. The fascia was closed with continuous # 1 vicryl suture. The subcutaneous tissue was closed with 2-0 vicryl continuous suture. The skin was closed with 3-0 vicryl subcuticular suture. All sponge, needle, and instrument counts were reported correct at the end of the case. Dry sterile dressings were placed over the incisions and around the chest tubes which were connected to pleurevac suction. The patient was then transported to the surgical intensive care unit in stable condition.

## 2022-08-11 NOTE — Anesthesia Procedure Notes (Signed)
Procedure Name: Intubation Date/Time: 08/11/2022 7:44 AM  Performed by: Colin Benton, CRNAPre-anesthesia Checklist: Patient identified, Emergency Drugs available, Suction available and Patient being monitored Patient Re-evaluated:Patient Re-evaluated prior to induction Oxygen Delivery Method: Circle system utilized Preoxygenation: Pre-oxygenation with 100% oxygen Induction Type: IV induction Ventilation: Mask ventilation without difficulty and Oral airway inserted - appropriate to patient size Laryngoscope Size: Sabra Heck and 2 Grade View: Grade I Tube type: Oral Tube size: 7.0 mm Number of attempts: 1 Airway Equipment and Method: Stylet and Oral airway Placement Confirmation: ETT inserted through vocal cords under direct vision, positive ETCO2 and breath sounds checked- equal and bilateral Secured at: 21 cm Tube secured with: Tape Dental Injury: Teeth and Oropharynx as per pre-operative assessment

## 2022-08-11 NOTE — Procedures (Signed)
Extubation Procedure Note  Patient Details:   Name: Nancy Oliver DOB: July 08, 1960 MRN: [id number]   Airway Documentation:  Airway 7 mm (Active)  Secured at (cm) 22 cm 08/11/22 1135  Measured From Lips 08/11/22 1135  Secured Location Right 08/11/22 1135  Secured By Pink Tape 08/11/22 1135  Site Condition Cool;Dry 08/11/22 1135   Vent end date: (not recorded) Vent end time: (not recorded)   Evaluation  O2 sats: stable throughout Complications: No apparent complications Patient did tolerate procedure well. Bilateral Breath Sounds: Clear, Diminished   Yes  Rosann Auerbach 08/11/2022, 2:31 PM

## 2022-08-11 NOTE — Anesthesia Procedure Notes (Signed)
Anesthesia Procedure Image    

## 2022-08-11 NOTE — Brief Op Note (Signed)
08/11/2022  11:12 AM  PATIENT:  Nancy Oliver  62 y.o. female  PRE-OPERATIVE DIAGNOSIS:  SEVERE AS  POST-OPERATIVE DIAGNOSIS:  SEVERE AS  PROCEDURE:  Procedure(s): AORTIC VALVE REPLACEMENT (AVR) USING INSPIRIS SIZE 23MM (N/A) TRANSESOPHAGEAL ECHOCARDIOGRAM (N/A)  SURGEON:  Surgeon(s) and Role:    * Marcello Tuzzolino, Fernande Boyden, MD - Primary  PHYSICIAN ASSISTANT: Enid Cutter, PA-C  ASSISTANTS: none   ANESTHESIA:   general    BLOOD ADMINISTERED:none  DRAINS:  two Chest Tube(s) in the mediastinum    LOCAL MEDICATIONS USED:  NONE  SPECIMEN:  Source of Specimen:  aortic valve  DISPOSITION OF SPECIMEN:  PATHOLOGY  COUNTS:  YES  TOURNIQUET:  * No tourniquets in log *  DICTATION: .Note written in EPIC  PLAN OF CARE: Admit to inpatient   PATIENT DISPOSITION:  ICU - intubated and hemodynamically stable.   Delay start of Pharmacological VTE agent (>24hrs) due to surgical blood loss or risk of bleeding: yes

## 2022-08-11 NOTE — Interval H&P Note (Signed)
History and Physical Interval Note:  08/11/2022 6:33 AM  Nancy Oliver  has presented today for surgery, with the diagnosis of SEVERE AS.  The various methods of treatment have been discussed with the patient and family. After consideration of risks, benefits and other options for treatment, the patient has consented to  Procedure(s): AORTIC VALVE REPLACEMENT (AVR) (N/A) TRANSESOPHAGEAL ECHOCARDIOGRAM (N/A) as a surgical intervention.  The patient's history has been reviewed, patient examined, no change in status, stable for surgery.  I have reviewed the patient's chart and labs.  Questions were answered to the patient's satisfaction.     Gaye Pollack

## 2022-08-11 NOTE — Anesthesia Preprocedure Evaluation (Addendum)
Anesthesia Evaluation  Patient identified by MRN, date of birth, ID band Patient awake    Reviewed: Allergy & Precautions, H&P , NPO status , Patient's Chart, lab work & pertinent test results  Airway Mallampati: III  TM Distance: <3 FB Neck ROM: Full    Dental no notable dental hx.    Pulmonary shortness of breath   Pulmonary exam normal breath sounds clear to auscultation       Cardiovascular hypertension, + Valvular Problems/Murmurs AS  Rhythm:Regular Rate:Normal + Systolic murmurs Left Ventricle: Left ventricular ejection fraction, by estimation, is 60  to 65%. The left ventricle has normal function. The left ventricle has no  regional wall motion abnormalities. The average left ventricular global  longitudinal strain is -20.4 %.  The global longitudinal strain is normal. The left ventricular internal  cavity size was normal in size. There is mild left ventricular  hypertrophy. Left ventricular diastolic parameters are consistent with  Grade I diastolic dysfunction (impaired  relaxation).   Right Ventricle: The right ventricular size is normal. Right ventricular  systolic function is normal.   Left Atrium: Left atrial size was normal in size.   Right Atrium: Right atrial size was normal in size.   Pericardium: There is no evidence of pericardial effusion.   Mitral Valve: The mitral valve is normal in structure. Moderate mitral  annular calcification. No evidence of mitral valve regurgitation. No  evidence of mitral valve stenosis.   Tricuspid Valve: The tricuspid valve is normal in structure. Tricuspid  valve regurgitation is trivial. No evidence of tricuspid stenosis.   Aortic Valve: The aortic valve is bicuspid. Aortic valve regurgitation is  trivial. Severe aortic stenosis is present. Aortic valve mean gradient  measures 38.0 mmHg. Aortic valve peak gradient measures 67.6 mmHg. Aortic  valve area, by VTI measures  1.22  cm.     Neuro/Psych negative neurological ROS  negative psych ROS   GI/Hepatic negative GI ROS, Neg liver ROS,,,  Endo/Other  diabetes  Morbid obesity  Renal/GU negative Renal ROS  negative genitourinary   Musculoskeletal negative musculoskeletal ROS (+)    Abdominal   Peds negative pediatric ROS (+)  Hematology negative hematology ROS (+)   Anesthesia Other Findings   Reproductive/Obstetrics negative OB ROS                             Anesthesia Physical Anesthesia Plan  ASA: 4  Anesthesia Plan: General   Post-op Pain Management: Minimal or no pain anticipated   Induction: Intravenous  PONV Risk Score and Plan: 3 and Ondansetron, Dexamethasone and Treatment may vary due to age or medical condition  Airway Management Planned: Oral ETT  Additional Equipment: Arterial line, CVP, PA Cath, TEE and Ultrasound Guidance Line Placement  Intra-op Plan:   Post-operative Plan: Post-operative intubation/ventilation  Informed Consent: I have reviewed the patients History and Physical, chart, labs and discussed the procedure including the risks, benefits and alternatives for the proposed anesthesia with the patient or authorized representative who has indicated his/her understanding and acceptance.     Dental advisory given  Plan Discussed with: CRNA and Surgeon  Anesthesia Plan Comments:        Anesthesia Quick Evaluation

## 2022-08-11 NOTE — Transfer of Care (Signed)
Immediate Anesthesia Transfer of Care Note  Patient: Nancy Oliver  Procedure(s) Performed: AORTIC VALVE REPLACEMENT (AVR) USING INSPIRIS SIZE 23MM (Chest) TRANSESOPHAGEAL ECHOCARDIOGRAM  Patient Location: ICU  Anesthesia Type:General  Level of Consciousness: sedated and Patient remains intubated per anesthesia plan  Airway & Oxygen Therapy: Patient remains intubated per anesthesia plan and Patient placed on Ventilator (see vital sign flow sheet for setting)  Post-op Assessment: Report given to RN and Post -op Vital signs reviewed and stable  Post vital signs: Reviewed and stable  Last Vitals:  Vitals Value Taken Time  BP 109/51 67 08/11/22 1138  Temp    Pulse 69 08/11/22 1138  Resp 15 08/11/22 1138  SpO2 94 % 08/11/22 1138  Vitals shown include unvalidated device data.  Last Pain:  Vitals:   08/11/22 0606  TempSrc:   PainSc: 0-No pain         Complications: No notable events documented.

## 2022-08-11 NOTE — Anesthesia Procedure Notes (Signed)
Arterial Line Insertion Start/End3/09/2022 6:50 AM, 08/11/2022 7:00 AM Performed by: Colin Benton, CRNA, CRNA  Patient location: Pre-op. Preanesthetic checklist: patient identified, IV checked, site marked, risks and benefits discussed, surgical consent, monitors and equipment checked, pre-op evaluation, timeout performed and anesthesia consent Lidocaine 1% used for infiltration and patient sedated Left, radial was placed Catheter size: 20 G Hand hygiene performed , maximum sterile barriers used  and Seldinger technique used Allen's test indicative of satisfactory collateral circulation Attempts: 2 Procedure performed without using ultrasound guided technique. Following insertion, dressing applied and Biopatch. Post procedure assessment: normal and unchanged  Patient tolerated the procedure well with no immediate complications.

## 2022-08-11 NOTE — Anesthesia Procedure Notes (Signed)
Central Venous Catheter Insertion Performed by: Myrtie Soman, MD, anesthesiologist Start/End3/09/2022 6:40 AM, 08/11/2022 6:56 AM Patient location: Pre-op. Preanesthetic checklist: patient identified, IV checked, site marked, risks and benefits discussed, surgical consent, monitors and equipment checked, pre-op evaluation, timeout performed and anesthesia consent Position: Trendelenburg Lidocaine 1% used for infiltration and patient sedated Hand hygiene performed  and maximum sterile barriers used  Catheter size: 8.5 Fr PA cath was placed.Sheath introducer Swan type:thermodilution PA Cath depth:40 Procedure performed using ultrasound guided technique. Ultrasound Notes:anatomy identified, needle tip was noted to be adjacent to the nerve/plexus identified, no ultrasound evidence of intravascular and/or intraneural injection and image(s) printed for medical record Attempts: 1 Following insertion, line sutured, dressing applied and Biopatch. Post procedure assessment: blood return through all ports, free fluid flow and no air  Patient tolerated the procedure well with no immediate complications.

## 2022-08-11 NOTE — Anesthesia Postprocedure Evaluation (Signed)
Anesthesia Post Note  Patient: Nancy Oliver  Procedure(s) Performed: AORTIC VALVE REPLACEMENT (AVR) USING INSPIRIS SIZE 23MM (Chest) TRANSESOPHAGEAL ECHOCARDIOGRAM     Patient location during evaluation: SICU Anesthesia Type: General Level of consciousness: sedated Pain management: pain level controlled Vital Signs Assessment: post-procedure vital signs reviewed and stable Respiratory status: patient remains intubated per anesthesia plan Cardiovascular status: stable Postop Assessment: no apparent nausea or vomiting Anesthetic complications: no  No notable events documented.  Last Vitals:  Vitals:   08/11/22 1415 08/11/22 1430  BP:    Pulse: 88 88  Resp: 16 17  Temp: (!) 36.3 C (!) 36.4 C  SpO2: 96% 96%    Last Pain:  Vitals:   08/11/22 0606  TempSrc:   PainSc: 0-No pain                 Dawson Hollman S

## 2022-08-12 ENCOUNTER — Inpatient Hospital Stay (HOSPITAL_COMMUNITY): Payer: Medicare HMO

## 2022-08-12 ENCOUNTER — Other Ambulatory Visit: Payer: Self-pay | Admitting: Cardiology

## 2022-08-12 ENCOUNTER — Encounter (HOSPITAL_COMMUNITY): Payer: Self-pay | Admitting: Surgery

## 2022-08-12 DIAGNOSIS — Z952 Presence of prosthetic heart valve: Secondary | ICD-10-CM

## 2022-08-12 LAB — GLUCOSE, CAPILLARY
Glucose-Capillary: 107 mg/dL — ABNORMAL HIGH (ref 70–99)
Glucose-Capillary: 112 mg/dL — ABNORMAL HIGH (ref 70–99)
Glucose-Capillary: 116 mg/dL — ABNORMAL HIGH (ref 70–99)
Glucose-Capillary: 127 mg/dL — ABNORMAL HIGH (ref 70–99)
Glucose-Capillary: 129 mg/dL — ABNORMAL HIGH (ref 70–99)
Glucose-Capillary: 129 mg/dL — ABNORMAL HIGH (ref 70–99)
Glucose-Capillary: 130 mg/dL — ABNORMAL HIGH (ref 70–99)
Glucose-Capillary: 137 mg/dL — ABNORMAL HIGH (ref 70–99)
Glucose-Capillary: 146 mg/dL — ABNORMAL HIGH (ref 70–99)
Glucose-Capillary: 153 mg/dL — ABNORMAL HIGH (ref 70–99)
Glucose-Capillary: 166 mg/dL — ABNORMAL HIGH (ref 70–99)

## 2022-08-12 LAB — BASIC METABOLIC PANEL
Anion gap: 3 — ABNORMAL LOW (ref 5–15)
Anion gap: 4 — ABNORMAL LOW (ref 5–15)
BUN: 8 mg/dL (ref 6–20)
BUN: 9 mg/dL (ref 6–20)
CO2: 26 mmol/L (ref 22–32)
CO2: 28 mmol/L (ref 22–32)
Calcium: 8 mg/dL — ABNORMAL LOW (ref 8.9–10.3)
Calcium: 7.6 mg/dL — ABNORMAL LOW (ref 8.9–10.3)
Chloride: 106 mmol/L (ref 98–111)
Chloride: 105 mmol/L (ref 98–111)
Creatinine, Ser: 0.63 mg/dL (ref 0.44–1.00)
Creatinine, Ser: 0.82 mg/dL (ref 0.44–1.00)
GFR, Estimated: >60 mL/min (ref >60)
GFR, Estimated: >60 mL/min (ref >60)
Glucose, Bld: 117 mg/dL — ABNORMAL HIGH (ref 70–99)
Glucose, Bld: 153 mg/dL — ABNORMAL HIGH (ref 70–99)
Potassium: 4 mmol/L (ref 3.5–5.1)
Potassium: 3.8 mmol/L (ref 3.5–5.1)
Sodium: 135 mmol/L (ref 135–145)
Sodium: 137 mmol/L (ref 135–145)

## 2022-08-12 LAB — CBC
HCT: 32.3 % — ABNORMAL LOW (ref 36.0–46.0)
HCT: 32.3 % — ABNORMAL LOW (ref 36.0–46.0)
Hemoglobin: 10.3 g/dL — ABNORMAL LOW (ref 12.0–15.0)
Hemoglobin: 10.2 g/dL — ABNORMAL LOW (ref 12.0–15.0)
MCH: 30.2 pg (ref 26.0–34.0)
MCH: 30.3 pg (ref 26.0–34.0)
MCHC: 31.9 g/dL (ref 30.0–36.0)
MCHC: 31.6 g/dL (ref 30.0–36.0)
MCV: 94.7 fL (ref 80.0–100.0)
MCV: 95.8 fL (ref 80.0–100.0)
Platelets: 171 10*3/uL (ref 150–400)
Platelets: 174 10*3/uL (ref 150–400)
RBC: 3.41 MIL/uL — ABNORMAL LOW (ref 3.87–5.11)
RBC: 3.37 MIL/uL — ABNORMAL LOW (ref 3.87–5.11)
RDW: 13.5 % (ref 11.5–15.5)
RDW: 13.7 % (ref 11.5–15.5)
WBC: 12.4 10*3/uL — ABNORMAL HIGH (ref 4.0–10.5)
WBC: 13.4 10*3/uL — ABNORMAL HIGH (ref 4.0–10.5)
nRBC: 0 % (ref 0.0–0.2)
nRBC: 0 % (ref 0.0–0.2)

## 2022-08-12 LAB — MAGNESIUM
Magnesium: 2.5 mg/dL — ABNORMAL HIGH (ref 1.7–2.4)
Magnesium: 2.1 mg/dL (ref 1.7–2.4)

## 2022-08-12 LAB — SURGICAL PATHOLOGY

## 2022-08-12 MED ORDER — ENOXAPARIN SODIUM 40 MG/0.4ML IJ SOSY
40.0000 mg | PREFILLED_SYRINGE | Freq: Every day | INTRAMUSCULAR | Status: DC
  Administered 2022-08-12 – 2022-08-16 (×5): 40 mg via SUBCUTANEOUS
  Filled 2022-08-12 (×5): qty 0.4

## 2022-08-12 MED ORDER — INSULIN ASPART 100 UNIT/ML IJ SOLN
0.0000 [IU] | INTRAMUSCULAR | Status: DC
  Administered 2022-08-12 – 2022-08-13 (×3): 2 [IU] via SUBCUTANEOUS
  Administered 2022-08-13: 8 [IU] via SUBCUTANEOUS
  Administered 2022-08-13: 4 [IU] via SUBCUTANEOUS

## 2022-08-12 MED ORDER — POTASSIUM CHLORIDE CRYS ER 20 MEQ PO TBCR
20.0000 meq | EXTENDED_RELEASE_TABLET | Freq: Two times a day (BID) | ORAL | Status: AC
  Administered 2022-08-12 (×2): 20 meq via ORAL
  Filled 2022-08-12 (×2): qty 1

## 2022-08-12 MED ORDER — INSULIN DETEMIR 100 UNIT/ML ~~LOC~~ SOLN
20.0000 [IU] | Freq: Every day | SUBCUTANEOUS | Status: DC
  Administered 2022-08-12 – 2022-08-17 (×6): 20 [IU] via SUBCUTANEOUS
  Filled 2022-08-12 (×6): qty 0.2

## 2022-08-12 MED ORDER — TRAMADOL HCL 50 MG PO TABS
50.0000 mg | ORAL_TABLET | ORAL | Status: DC | PRN
  Administered 2022-08-12: 50 mg via ORAL
  Filled 2022-08-12: qty 1

## 2022-08-12 MED ORDER — FUROSEMIDE 10 MG/ML IJ SOLN
40.0000 mg | Freq: Once | INTRAMUSCULAR | Status: AC
  Administered 2022-08-12: 40 mg via INTRAVENOUS
  Filled 2022-08-12: qty 4

## 2022-08-12 MED ORDER — ORAL CARE MOUTH RINSE: 15.0000 mL | OROMUCOSAL | Status: DC | PRN

## 2022-08-12 MED FILL — Thrombin (Recombinant) For Soln 20000 Unit: CUTANEOUS | Qty: 1 | Status: AC

## 2022-08-12 NOTE — Discharge Summary (Signed)
Physician Discharge Summary  Patient ID: MELICENT EISENHUTH MRN: [id number] DOB/AGE: 1961/06/01 62 y.o.  Admit date: 08/11/2022 Discharge date: 08/15/2022  Admission Diagnoses: Severe aortic stenosis Bicuspid aortic valve Dyslipidemia Hypertension Type 2 diabetes mellitus  Discharge Diagnoses:   Severe aortic stenosis Bicuspid aortic valve Dyslipidemia Hypertension Type 2 diabetes mellitus S/P AVR Expected acute blood loss anemia, mild Post-operative atrial fibrillation  Discharged Condition: {condition:18240}  PCP is Lennie Odor, PA Referring Provider is Early Osmond, MD  History of Present Illness:   The patient is a 62 year old woman with a history of morbid obesity, diabetes, hyperlipidemia, hypertension, and aortic stenosis who was referred for consideration of aortic valve replacement.  She reports a 6-12 month history of progressive exertional fatigue and shortness of breath as well as episodes of dizziness when bending over and bilateral lower extremity edema.  Her most recent echocardiogram on 04/29/2022 showed a bicuspid aortic valve with a mean gradient of 38 mmHg and a peak gradient of 68 mmHg.  Aortic valve area by VTI was 1.22 cm.  The aortic valve leaflets were calcified and thickened with poor leaflet mobility.  Left ventricular ejection fraction was 60 to 65% with grade 1 diastolic dysfunction.  There was no mitral regurgitation.  Cardiac catheterization showed a normal left dominant circulation with normal filling pressures and a cardiac index of 3.3.  She said that she has been trying to lose weight and has lost about 70 pounds but has not noted any improvement in her symptoms.  This 62 year old woman has stage D, severe, symptomatic aortic stenosis with NYHA class III symptoms of exertional fatigue and shortness of breath consistent with chronic diastolic congestive heart failure.  She has also been having episodes of dizziness with standing up or bending over  as well as lower extremity edema.  I agree that aortic valve replacement is indicated in this patient for relief of her symptoms and to prevent left ventricular dysfunction.  Given her young age I do not think she would be a good candidate for TAVR and I would recommend open surgical aortic valve replacement.  We discussed the alternatives of mechanical and bioprosthetic valves and she would like to use a bioprosthetic valve so she does not need to be on Coumadin.  I think that is a reasonable alternative for her. CTA of the chest shows no aortic aneurysm.   I discussed the operative procedure with the patient and her daughter including alternatives, benefits and risks; including but not limited to bleeding, blood transfusion, infection, stroke, myocardial infarction, graft failure, heart block requiring a permanent pacemaker, organ dysfunction, and death.  [name] understands and agrees to proceed.    Hospital Course: Ms. Carmack was admitted for elective surgery on 08/11/2022.  She was taken to the operative room where aortic valve replacement was carried out utilizing a 23 mm an Edwards Inspiris Resilia pericardial tissue valve.  The procedure was without complication and well-tolerated.  Following the operation, she separated from cardiopulmonary bypass without difficulty and was transferred to the surgical ICU in stable condition.  She was weaned from the ventilator and extubated by 2:30 in the afternoon on the day of surgery.  On the first postoperative day, the chest tubes and monitoring lines were removed.  Diuresis was begun.  Glucose was initially managed with an insulin drip and later transitioned to sliding scale insulin.  She developed atrial atrial fibrillation on the evening of the first post-op day and was stared on IV amiodarone loading. She  converted back to SR and was transitioned to oral amiodarone. She was transferred to 4E Progressive Care on post-op day 2. She progressed to  independent mobility.  Expected post-op volume excess was treated with diuretics with good response. Renal function remained stable. Diet was advanced routinely and well tolerate and she had return of normal bowel and bladder function.    Consults: None  Significant Diagnostic Studies: CLINICAL DATA:  S/p AVR   EXAM: PORTABLE CHEST 1 VIEW   COMPARISON:  08/11/21 CXR   FINDINGS: Status post median sternotomy and aortic valve repair. Interval removal of thePA catheter; sheath remains. Interval removal of the mediastinal drain.   Unchanged enlarged cardiac contours. New hazy opacity at the left lung base could represent atelectasis or infection. No radiographically apparent displaced rib fracture. Visualized upper abdomen is unremarkable.   IMPRESSION: 1. Interval removal of the PA catheter and mediastinal drain. 2. New hazy opacity at the left lung base could represent atelectasis or infection.     Electronically Signed   By: Marin Roberts M.D.   On: 08/13/2022 08:41    Treatments: Surgery  CARDIOVASCULAR SURGERY OPERATIVE NOTE   08/11/2022 KRISTYANNA MCNEMAR [id number]   Surgeon:  Gaye Pollack, MD   First Assistant: Enid Cutter,  PA-C     Preoperative Diagnosis:  Bicuspid aortic valve with severe aortic stenosis     Postoperative Diagnosis:  Same     Procedure:   Median Sternotomy Extracorporeal circulation 3.   Aortic valve replacement using a 23 mm Edwards INSPIRIS RESILIA pericardial valve.   Anesthesia:  General Endotracheal    Discharge Exam: Blood pressure 119/67, pulse 70, temperature 98.1 F (36.7 C), temperature source Oral, resp. rate 20, height '5\' 5"'$  (1.651 m), weight 114.2 kg, last menstrual period 10/07/2012, SpO2 92 %. {physical [id number]  Disposition:  Discharged to home in stable condition.  Discharge Instructions     Amb Referral to Cardiac Rehabilitation   Complete by: As directed    Diagnosis: Valve Replacement   Valve:  Aortic   After initial evaluation and assessments completed: Virtual Based Care may be provided alone or in conjunction with Phase 2 Cardiac Rehab based on patient barriers.: Yes   Intensive Cardiac Rehabilitation (ICR) [facility] location only OR Traditional Cardiac Rehabilitation (TCR) *If criteria for ICR are not met will enroll in TCR [facility] only): Yes      Allergies as of 08/15/2022   No Known Allergies   Med Rec must be completed prior to using this [facility]***       Follow-up Information     Gaye Pollack, MD. Go on 09/10/2022.   Specialty: Cardiothoracic Surgery Why: Your appointment is at 2:30pm Please obtain a chest x-ray 1 hour before the appointment at [facility] located at [address] Contact information: [address] [place] [address] [place] [facility]. Go on 08/25/2022.   Specialty: Cardiothoracic Surgery Why: Your appointment for suture removal is at 11am Contact information: [place], [place], [place], NP. Go on 09/01/2022.   Specialties: Cardiology, Family Medicine Why: Your cardiology follow up appointment is at 11:20am. Contact information: [address] [place] [address] [phone]                 The patient has been discharged on:  1.Beta Blocker:  Yes [  x ]                              No   [   ]                              If No, reason:  2.Ace Inhibitor/ARB: Yes [   ]                                     No  [  x  ]                                     If No, reason:  Soft BP, titrating metoprolol.  3.Statin:   Yes [   ]                  No  [  x ]                  If No, reason:  not indicated  4.[place]:  Yes  [  x ]                  No   [   ]                  If No, reason:  5. ACS on Admission? No  P2Y12 Inhibitor:  Yes  [   ]                                 No  [ x ]    Signed: Myron G. Roddenberry 08/15/2022, 2:10 PM

## 2022-08-12 NOTE — Progress Notes (Signed)
1 Day Post-Op Procedure(s) (LRB): AORTIC VALVE REPLACEMENT (AVR) USING INSPIRIS SIZE 23MM (N/A) TRANSESOPHAGEAL ECHOCARDIOGRAM (N/A) Subjective: No complaints. Slept some.  Objective: Vital signs in last 24 hours: Temp:  [95.9 F (35.5 C)-99.5 F (37.5 C)] 99.5 F (37.5 C) (03/05 0500) Pulse Rate:  [69-89] 88 (03/05 0500) Cardiac Rhythm: Atrial paced (03/05 0000) Resp:  [8-23] 16 (03/05 0500) BP: (127-132)/(81) 127/81 (03/04 0710) SpO2:  [93 %-99 %] 97 % (03/05 0500) Arterial Line BP: (96-160)/(46-75) 129/49 (03/05 0500) FiO2 (%):  [40 %-50 %] 40 % (03/04 1356)  Hemodynamic parameters for last 24 hours: PAP: (16-26)/(8-13) 23/9 CO:  [3.9 L/min-6 L/min] 6 L/min CI:  [1.8 L/min/m2-2.8 L/min/m2] 2.8 L/min/m2  Intake/Output from previous day: 03/04 0701 - 03/05 0700 In: 4503 [I.V.:2053.1; Blood:330; IV Piggyback:2120] Out: [id number]; Blood:550; Chest Tube:170] Intake/Output this shift: Total I/O In: 356.9 [I.V.:156.8; IV Piggyback:200.1] Out: 355 [Urine:345; Chest Tube:10]  General appearance: alert and cooperative Neurologic: intact Heart: regular rate and rhythm, S1, S2 normal, no murmur Lungs: clear to auscultation bilaterally Extremities: edema mild Wound: dressing dry  Lab Results: Recent Labs    08/11/22 1724 08/12/22 0519  WBC 17.9* 12.4*  HGB 10.9* 10.3*  HCT 33.6* 32.3*  PLT 208 171   BMET:  Recent Labs    08/11/22 1724 08/12/22 0519  NA 140 135  K 4.2 4.0  CL 109 106  CO2 23 26  GLUCOSE 117* 117*  BUN 12 8  CREATININE 0.71 0.63  CALCIUM 7.9* 8.0*    PT/INR:  Recent Labs    08/11/22 1152  LABPROT 16.5*  INR 1.3*   ABG    Component Value Date/Time   PHART 7.278 (L) 08/11/2022 1533   HCO3 22.3 08/11/2022 1533   TCO2 24 08/11/2022 1533   ACIDBASEDEF 5.0 (H) 08/11/2022 1533   O2SAT 99 08/11/2022 1533   CBG (last 3)  Recent Labs    08/12/22 0128 08/12/22 0313 08/12/22 0523  GLUCAP 130* 116* 112*   CXR: clear  ECG:  pending  Assessment/Plan: S/P Procedure(s) (LRB): AORTIC VALVE REPLACEMENT (AVR) USING INSPIRIS SIZE 23MM (N/A) TRANSESOPHAGEAL ECHOCARDIOGRAM (N/A)  POD 1 Hemodynamically stable in sinus rhythm. Will hold on Lopressor today with low normal BP. Plan to start tomorrow.  Diurese some.  DC chest tubes, swan, arterial line.  DM: preop Hgb A1c 6.4. Transition to Levemir and SSI.  IS, OOB, mobilize.   LOS: 1 day    Gaye Pollack 08/12/2022

## 2022-08-12 NOTE — Hospital Course (Signed)
PCP is Lennie Odor, [place] Referring Provider is Early Osmond, MD  History of Present Illness:   The patient is a 62 year old woman with a history of morbid obesity, diabetes, hyperlipidemia, hypertension, and aortic stenosis who was referred for consideration of aortic valve replacement.  She reports a 6-12 month history of progressive exertional fatigue and shortness of breath as well as episodes of dizziness when bending over and bilateral lower extremity edema.  Her most recent echocardiogram on 04/29/2022 showed a bicuspid aortic valve with a mean gradient of 38 mmHg and a peak gradient of 68 mmHg.  Aortic valve area by VTI was 1.22 cm.  The aortic valve leaflets were calcified and thickened with poor leaflet mobility.  Left ventricular ejection fraction was 60 to 65% with grade 1 diastolic dysfunction.  There was no mitral regurgitation.  Cardiac catheterization showed a normal left dominant circulation with normal filling pressures and a cardiac index of 3.3.  She said that she has been trying to lose weight and has lost about 70 pounds but has not noted any improvement in her symptoms.  This 62 year old woman has stage D, severe, symptomatic aortic stenosis with NYHA class III symptoms of exertional fatigue and shortness of breath consistent with chronic diastolic congestive heart failure.  She has also been having episodes of dizziness with standing up or bending over as well as lower extremity edema.  I agree that aortic valve replacement is indicated in this patient for relief of her symptoms and to prevent left ventricular dysfunction.  Given her young age I do not think she would be a good candidate for TAVR and I would recommend open surgical aortic valve replacement.  We discussed the alternatives of mechanical and bioprosthetic valves and she would like to use a bioprosthetic valve so she does not need to be on Coumadin.  I think that is a reasonable alternative for her. CTA of the chest  shows no aortic aneurysm.   I discussed the operative procedure with the patient and her daughter including alternatives, benefits and risks; including but not limited to bleeding, blood transfusion, infection, stroke, myocardial infarction, graft failure, heart block requiring a permanent pacemaker, organ dysfunction, and death.  Nancy Oliver understands and agrees to proceed.    Hospital Course: Nancy Oliver was admitted for elective surgery on 08/11/2022.  She was taken to the operative room where aortic valve replacement was carried out utilizing a 23 mm an Edwards Inspiris Resilia pericardial tissue valve.  The procedure was without complication and well-tolerated.  Following the operation, she separated from cardiopulmonary bypass without difficulty and was transferred to the surgical ICU in stable condition.  She was weaned from the ventilator and extubated by 2:30 in the afternoon on the day of surgery.  On the first postoperative day, the chest tubes and monitoring lines were removed.  Diuresis was begun.  Glucose was initially managed with an insulin drip and later transitioned to sliding scale insulin.  She was mobilized and progressed well with ambulation and transfers. She developed atrial fibrillation treated with IV amiodarone loading followed by transition to the oral medication. She converted back to SR. Nancy Oliver was transferred to 4E on post-op day 2. Diuresis continued for expected volume excess.

## 2022-08-12 NOTE — Progress Notes (Signed)
      [address]       [place] [address]             [phone]      POD # 1 AVR  BP (!) 88/76 (BP Location: Right Arm)   Pulse 93   Temp 98.7 F (37.1 C) (Oral)   Resp 15   Ht '5\' 5"'$  (1.651 m)   Wt 115.4 kg   LMP 10/07/2012   SpO2 98%   BMI 42.34 kg/m   Intake/Output Summary (Last 24 hours) at 08/12/2022 1747 Last data filed at 08/12/2022 1600 Gross per 24 hour  Intake 2499.31 ml  Output 3275 ml  Net -775.69 ml   CBG well controlled  PM labs pending  Remo Lipps C. Roxan Hockey, MD [facility] [phone]

## 2022-08-12 NOTE — Discharge Instructions (Signed)

## 2022-08-13 ENCOUNTER — Inpatient Hospital Stay (HOSPITAL_COMMUNITY): Payer: Medicare HMO

## 2022-08-13 LAB — CBC
HCT: 30.1 % — ABNORMAL LOW (ref 36.0–46.0)
Hemoglobin: 9.7 g/dL — ABNORMAL LOW (ref 12.0–15.0)
MCH: 30.4 pg (ref 26.0–34.0)
MCHC: 32.2 g/dL (ref 30.0–36.0)
MCV: 94.4 fL (ref 80.0–100.0)
Platelets: 151 10*3/uL (ref 150–400)
RBC: 3.19 MIL/uL — ABNORMAL LOW (ref 3.87–5.11)
RDW: 13.6 % (ref 11.5–15.5)
WBC: 12.9 10*3/uL — ABNORMAL HIGH (ref 4.0–10.5)
nRBC: 0 % (ref 0.0–0.2)

## 2022-08-13 LAB — POCT I-STAT, CHEM 8
BUN: 12 mg/dL (ref 6–20)
Calcium, Ion: 1.12 mmol/L — ABNORMAL LOW (ref 1.15–1.40)
Chloride: 100 mmol/L (ref 98–111)
Creatinine, Ser: 0.8 mg/dL (ref 0.44–1.00)
Glucose, Bld: 185 mg/dL — ABNORMAL HIGH (ref 70–99)
HCT: 29 % — ABNORMAL LOW (ref 36.0–46.0)
Hemoglobin: 9.9 g/dL — ABNORMAL LOW (ref 12.0–15.0)
Potassium: 4.1 mmol/L (ref 3.5–5.1)
Sodium: 135 mmol/L (ref 135–145)
TCO2: 24 mmol/L (ref 22–32)

## 2022-08-13 LAB — BASIC METABOLIC PANEL
Anion gap: 9 (ref 5–15)
BUN: 11 mg/dL (ref 6–20)
CO2: 26 mmol/L (ref 22–32)
Calcium: 8.2 mg/dL — ABNORMAL LOW (ref 8.9–10.3)
Chloride: 100 mmol/L (ref 98–111)
Creatinine, Ser: 0.79 mg/dL (ref 0.44–1.00)
GFR, Estimated: >60 mL/min (ref >60)
Glucose, Bld: 205 mg/dL — ABNORMAL HIGH (ref 70–99)
Potassium: 4.3 mmol/L (ref 3.5–5.1)
Sodium: 135 mmol/L (ref 135–145)

## 2022-08-13 LAB — GLUCOSE, CAPILLARY
Glucose-Capillary: 195 mg/dL — ABNORMAL HIGH (ref 70–99)
Glucose-Capillary: 232 mg/dL — ABNORMAL HIGH (ref 70–99)
Glucose-Capillary: 169 mg/dL — ABNORMAL HIGH (ref 70–99)
Glucose-Capillary: 101 mg/dL — ABNORMAL HIGH (ref 70–99)
Glucose-Capillary: 146 mg/dL — ABNORMAL HIGH (ref 70–99)

## 2022-08-13 MED ORDER — ASPIRIN 325 MG PO TBEC
325.0000 mg | DELAYED_RELEASE_TABLET | Freq: Every day | ORAL | Status: DC
  Administered 2022-08-14 – 2022-08-17 (×4): 325 mg via ORAL
  Filled 2022-08-13 (×4): qty 1

## 2022-08-13 MED ORDER — INSULIN ASPART 100 UNIT/ML IJ SOLN
0.0000 [IU] | Freq: Three times a day (TID) | INTRAMUSCULAR | Status: DC
  Administered 2022-08-13: 4 [IU] via SUBCUTANEOUS
  Administered 2022-08-13 – 2022-08-16 (×10): 2 [IU] via SUBCUTANEOUS

## 2022-08-13 MED ORDER — METFORMIN HCL 500 MG PO TABS
1000.0000 mg | ORAL_TABLET | Freq: Two times a day (BID) | ORAL | Status: DC
  Administered 2022-08-13 – 2022-08-17 (×9): 1000 mg via ORAL
  Filled 2022-08-13 (×9): qty 2

## 2022-08-13 MED ORDER — AMIODARONE HCL IN DEXTROSE 360-4.14 MG/200ML-% IV SOLN
60.0000 mg/h | INTRAVENOUS | Status: AC
  Administered 2022-08-13 (×2): 60 mg/h via INTRAVENOUS
  Filled 2022-08-13 (×2): qty 200

## 2022-08-13 MED ORDER — SENNOSIDES-DOCUSATE SODIUM 8.6-50 MG PO TABS
1.0000 | ORAL_TABLET | Freq: Two times a day (BID) | ORAL | Status: DC | PRN
  Administered 2022-08-14: 1 via ORAL
  Filled 2022-08-13: qty 1

## 2022-08-13 MED ORDER — METOPROLOL TARTRATE 12.5 MG HALF TABLET
12.5000 mg | ORAL_TABLET | Freq: Two times a day (BID) | ORAL | Status: DC
  Administered 2022-08-13 – 2022-08-15 (×6): 12.5 mg via ORAL
  Filled 2022-08-13 (×6): qty 1

## 2022-08-13 MED ORDER — SODIUM CHLORIDE 0.9% FLUSH
3.0000 mL | Freq: Two times a day (BID) | INTRAVENOUS | Status: DC
  Administered 2022-08-13 – 2022-08-16 (×5): 3 mL via INTRAVENOUS

## 2022-08-13 MED ORDER — AMIODARONE LOAD VIA INFUSION
150.0000 mg | Freq: Once | INTRAVENOUS | Status: AC
  Administered 2022-08-13: 150 mg via INTRAVENOUS
  Filled 2022-08-13: qty 83.34

## 2022-08-13 MED ORDER — FUROSEMIDE 40 MG PO TABS
40.0000 mg | ORAL_TABLET | Freq: Every day | ORAL | Status: AC
  Administered 2022-08-13 – 2022-08-15 (×3): 40 mg via ORAL
  Filled 2022-08-13 (×3): qty 1

## 2022-08-13 MED ORDER — ~~LOC~~ CARDIAC SURGERY, PATIENT & FAMILY EDUCATION: Freq: Once | Status: AC

## 2022-08-13 MED ORDER — SODIUM CHLORIDE 0.9 % IV SOLN: 250.0000 mL | INTRAVENOUS | Status: DC | PRN

## 2022-08-13 MED ORDER — AMIODARONE HCL IN DEXTROSE 360-4.14 MG/200ML-% IV SOLN
30.0000 mg/h | INTRAVENOUS | Status: DC
  Administered 2022-08-13 – 2022-08-14 (×2): 30 mg/h via INTRAVENOUS
  Filled 2022-08-13 (×2): qty 200

## 2022-08-13 MED ORDER — SODIUM CHLORIDE 0.9% FLUSH: 3.0000 mL | INTRAVENOUS | Status: DC | PRN

## 2022-08-13 MED ORDER — POTASSIUM CHLORIDE CRYS ER 20 MEQ PO TBCR
20.0000 meq | EXTENDED_RELEASE_TABLET | Freq: Two times a day (BID) | ORAL | Status: DC
  Administered 2022-08-13 – 2022-08-14 (×4): 20 meq via ORAL
  Filled 2022-08-13 (×4): qty 1

## 2022-08-13 NOTE — Progress Notes (Signed)
Mobility Specialist Progress Note:   08/13/22 1616  Mobility  Activity Ambulated with assistance in hallway  Level of Assistance Standby assist, set-up cues, supervision of patient - no hands on  Assistive Device Front wheel walker  Distance Ambulated (ft) 500 ft  Activity Response Tolerated well  $Mobility charge 1 Mobility   Pt in bed willing to participate in mobility. No complaints of pain. Left in bed with call bell in reach and all needs met.   Gareth Eagle Aric Jost Mobility Specialist Please contact via [facility] [employer] or  Rehab Office at [phone]

## 2022-08-13 NOTE — Progress Notes (Signed)
2 Days Post-Op Procedure(s) (LRB): AORTIC VALVE REPLACEMENT (AVR) USING INSPIRIS SIZE 23MM (N/A) TRANSESOPHAGEAL ECHOCARDIOGRAM (N/A) Subjective: No complaints. Ambulated this am.  Martin Majestic into atrial fib with RVR last night and started on amio. Converted about 5:30 am.  Objective: Vital signs in last 24 hours: Temp:  [98.1 F (36.7 C)-99.3 F (37.4 C)] 98.1 F (36.7 C) (03/06 0418) Pulse Rate:  [65-128] 72 (03/06 0600) Cardiac Rhythm: Atrial fibrillation (03/06 0000) Resp:  [9-22] 19 (03/06 0600) BP: (83-128)/(51-103) 126/67 (03/06 0600) SpO2:  [94 %-98 %] 96 % (03/06 0600) Arterial Line BP: (128-138)/(54-55) 128/54 (03/05 0900) Weight:  [114.4 kg] 114.4 kg (03/06 0500)  Hemodynamic parameters for last 24 hours: PAP: (23-25)/(11-14) 23/14  Intake/Output from previous day: 03/05 0701 - 03/06 0700 In: 1253.4 [P.O.:310; I.V.:495.7; IV Piggyback:437.7] Out: 2280 [Urine:2250; Chest Tube:30] Intake/Output this shift: Total I/O In: 490.2 [I.V.:293.2; IV Piggyback:197] Out: 320 [Urine:320]  General appearance: alert and cooperative Neurologic: intact Heart: regular rate and rhythm, S1, S2 normal, no murmur Lungs: clear to auscultation bilaterally Extremities: edema mild Wound: dressing dry  Lab Results: Recent Labs    08/12/22 1830 08/13/22 0009 08/13/22 0405  WBC 13.4*  --  12.9*  HGB 10.2* 9.9* 9.7*  HCT 32.3* 29.0* 30.1*  PLT 174  --  151   BMET:  Recent Labs    08/12/22 1830 08/13/22 0009 08/13/22 0405  NA 137 135 135  K 3.8 4.1 4.3  CL 105 100 100  CO2 28  --  26  GLUCOSE 153* 185* 205*  BUN '9 12 11  '$ CREATININE 0.82 0.80 0.79  CALCIUM 7.6*  --  8.2*    PT/INR:  Recent Labs    08/11/22 1152  LABPROT 16.5*  INR 1.3*   ABG    Component Value Date/Time   PHART 7.278 (L) 08/11/2022 1533   HCO3 22.3 08/11/2022 1533   TCO2 24 08/13/2022 0009   ACIDBASEDEF 5.0 (H) 08/11/2022 1533   O2SAT 99 08/11/2022 1533   CBG (last 3)  Recent Labs     08/12/22 1944 08/12/22 2352 08/13/22 0410  GLUCAP 129* 146* 195*   CXR: left base atelectasis  Assessment/Plan: S/P Procedure(s) (LRB): AORTIC VALVE REPLACEMENT (AVR) USING INSPIRIS SIZE 23MM (N/A) TRANSESOPHAGEAL ECHOCARDIOGRAM (N/A)  POD 2 Hemodynamically stable.  Postop atrial fib converted with IV amio. Will continue IV via sleeve today and convert to po tomorrow. Start Lopressor 12.5 bid.  Volume excess: -1026 cc yesterday and wt down 2.2 lbs. Still 12 lbs over preop. Continue lasix and KCL.  DM: glucose under adequate control. Increased to 195 this am. Will resume metformin with Levemir and SSI.  Transfer to 4E and continue IS, ambulation.   LOS: 2 days    Gaye Pollack 08/13/2022

## 2022-08-13 NOTE — Progress Notes (Signed)
  Amiodarone Drug - Drug Interaction Consult Note  Recommendations: Monitor for signs of myopathy.  Amiodarone is metabolized by the cytochrome P450 system and therefore has the potential to cause many drug interactions. Amiodarone has an average plasma half-life of 50 days (range 20 to 100 days).   There is potential for drug interactions to occur several weeks or months after stopping treatment and the onset of drug interactions may be slow after initiating amiodarone.   '[x]'$  Statins: Increased risk of myopathy. Simvastatin- restrict dose to '20mg'$  daily. Other statins: counsel patients to report any muscle pain or weakness immediately.  '[]'$  Anticoagulants: Amiodarone can increase anticoagulant effect. Consider warfarin dose reduction. Patients should be monitored closely and the dose of anticoagulant altered accordingly, remembering that amiodarone levels take several weeks to stabilize.  '[]'$  Antiepileptics: Amiodarone can increase plasma concentration of phenytoin, the dose should be reduced. Note that small changes in phenytoin dose can result in large changes in levels. Monitor patient and counsel on signs of toxicity.  '[]'$  Beta blockers: increased risk of bradycardia, AV block and myocardial depression. Sotalol - avoid concomitant use.  '[]'$   Calcium channel blockers (diltiazem and verapamil): increased risk of bradycardia, AV block and myocardial depression.  '[]'$   Cyclosporine: Amiodarone increases levels of cyclosporine. Reduced dose of cyclosporine is recommended.  '[]'$  Digoxin dose should be halved when amiodarone is started.  '[]'$  Diuretics: increased risk of cardiotoxicity if hypokalemia occurs.  '[]'$  Oral hypoglycemic agents (glyburide, glipizide, glimepiride): increased risk of hypoglycemia. Patient's glucose levels should be monitored closely when initiating amiodarone therapy.   '[]'$  Drugs that prolong the QT interval:  Torsades de pointes risk may be increased with concurrent use - avoid  if possible.  Monitor QTc, also keep magnesium/potassium WNL if concurrent therapy can't be avoided.  Antibiotics: e.g. fluoroquinolones, erythromycin.  Antiarrhythmics: e.g. quinidine, procainamide, disopyramide, sotalol.  Antipsychotics: e.g. phenothiazines, haloperidol.   Lithium, tricyclic antidepressants, and methadone.   Wynona Neat, PharmD, BCPS  08/13/2022 1:30 AM

## 2022-08-14 LAB — GLUCOSE, CAPILLARY
Glucose-Capillary: 99 mg/dL (ref 70–99)
Glucose-Capillary: 146 mg/dL — ABNORMAL HIGH (ref 70–99)
Glucose-Capillary: 138 mg/dL — ABNORMAL HIGH (ref 70–99)
Glucose-Capillary: 138 mg/dL — ABNORMAL HIGH (ref 70–99)

## 2022-08-14 MED ORDER — METOLAZONE 5 MG PO TABS
2.5000 mg | ORAL_TABLET | Freq: Once | ORAL | Status: AC
  Administered 2022-08-14: 2.5 mg via ORAL
  Filled 2022-08-14: qty 1

## 2022-08-14 MED ORDER — CHLORHEXIDINE GLUCONATE CLOTH 2 % EX PADS
6.0000 | MEDICATED_PAD | Freq: Every day | CUTANEOUS | Status: DC
  Administered 2022-08-14: 6 via TOPICAL

## 2022-08-14 MED ORDER — FE FUM-VIT C-VIT B12-FA 460-60-0.01-1 MG PO CAPS
1.0000 | ORAL_CAPSULE | Freq: Every day | ORAL | Status: DC
  Administered 2022-08-14 – 2022-08-17 (×4): 1 via ORAL
  Filled 2022-08-14 (×4): qty 1

## 2022-08-14 MED ORDER — BISACODYL 10 MG RE SUPP
10.0000 mg | Freq: Every day | RECTAL | Status: DC | PRN
  Filled 2022-08-14: qty 1

## 2022-08-14 MED ORDER — AMIODARONE HCL 200 MG PO TABS
400.0000 mg | ORAL_TABLET | Freq: Two times a day (BID) | ORAL | Status: DC
  Administered 2022-08-14 – 2022-08-17 (×7): 400 mg via ORAL
  Filled 2022-08-14 (×7): qty 2

## 2022-08-14 NOTE — Progress Notes (Signed)
Mobility Specialist Progress Note:   08/14/22 1519  Mobility  Activity Ambulated with assistance in hallway  Level of Assistance Standby assist, set-up cues, supervision of patient - no hands on  Assistive Device None  Distance Ambulated (ft) 500 ft  Activity Response Tolerated well  $Mobility charge 1 Mobility   Pt in bed willing to participate in mobility. No complaints of pain. Left in bed with call bell in reach and all needs met.   Gareth Eagle Latanya Hemmer Mobility Specialist Please contact via [facility] [employer] or  Rehab Office at [phone]

## 2022-08-14 NOTE — Progress Notes (Signed)
CARDIAC REHAB PHASE I   PRE:  Rate/Rhythm: 78 NSR  BP:  Sitting: 111/64      SaO2: 96 RA  MODE:  Ambulation: 220 ft   AD:  None  POST:  Rate/Rhythm: 96 NSR  BP:  Sitting: 130/67      SaO2: 94 RA  Pt amb with standby assistance, pt denies CP and SOB during amb and was returned to room w/o complaint.   Pt returned to chair and lunch order was made.   [id number]  Christen Bame  11:55 AM 08/14/2022

## 2022-08-14 NOTE — Progress Notes (Signed)
Mobility Specialist Progress Note:   08/14/22 0851  Mobility  Activity Ambulated with assistance in hallway  Level of Assistance Standby assist, set-up cues, supervision of patient - no hands on  Assistive Device Front wheel walker  Distance Ambulated (ft) 500 ft  Activity Response Tolerated well  Mobility Referral Yes  $Mobility charge 1 Mobility   Pt in bed willing to participate in mobility. No complaints of pain. Left in chair with call bell in reach and all needs met.   Nancy Oliver [name] Mobility Specialist Please contact via [employer] or  Rehab Office at [phone]

## 2022-08-14 NOTE — Progress Notes (Signed)
3 Days Post-Op Procedure(s) (LRB): AORTIC VALVE REPLACEMENT (AVR) USING INSPIRIS SIZE 23MM (N/A) TRANSESOPHAGEAL ECHOCARDIOGRAM (N/A) Subjective: No complaints.   Maintained sinus rhythm on IV amio.  No BM yet but feels like she is close.  Objective: Vital signs in last 24 hours: Temp:  [98.1 F (36.7 C)-99.4 F (37.4 C)] 98.3 F (36.8 C) (03/07 0342) Pulse Rate:  [67-88] 69 (03/07 0342) Cardiac Rhythm: Normal sinus rhythm (03/07 0356) Resp:  [17-24] 19 (03/07 0342) BP: (99-144)/(54-72) 125/72 (03/07 0342) SpO2:  [94 %-97 %] 96 % (03/07 0342) Weight:  [114.8 kg] 114.8 kg (03/07 0342)  Hemodynamic parameters for last 24 hours:    Intake/Output from previous day: 03/06 0701 - 03/07 0700 In: 473.3 [P.O.:440; I.V.:33.3] Out: 500 [Urine:500] Intake/Output this shift: Total I/O In: 200 [P.O.:200] Out: 350 [Urine:350]  General appearance: alert and cooperative Neurologic: intact Heart: regular rate and rhythm, S1, S2 normal, no murmur Lungs: clear to auscultation bilaterally Extremities: edema mild Wound: incision ok  Lab Results: Recent Labs    08/12/22 1830 08/13/22 0009 08/13/22 0405  WBC 13.4*  --  12.9*  HGB 10.2* 9.9* 9.7*  HCT 32.3* 29.0* 30.1*  PLT 174  --  151   BMET:  Recent Labs    08/12/22 1830 08/13/22 0009 08/13/22 0405  NA 137 135 135  K 3.8 4.1 4.3  CL 105 100 100  CO2 28  --  26  GLUCOSE 153* 185* 205*  BUN '9 12 11  '$ CREATININE 0.82 0.80 0.79  CALCIUM 7.6*  --  8.2*    PT/INR:  Recent Labs    08/11/22 1152  LABPROT 16.5*  INR 1.3*   ABG    Component Value Date/Time   PHART 7.278 (L) 08/11/2022 1533   HCO3 22.3 08/11/2022 1533   TCO2 24 08/13/2022 0009   ACIDBASEDEF 5.0 (H) 08/11/2022 1533   O2SAT 99 08/11/2022 1533   CBG (last 3)  Recent Labs    08/13/22 1632 08/13/22 2122 08/14/22 0617  GLUCAP 101* 146* 99    Assessment/Plan: S/P Procedure(s) (LRB): AORTIC VALVE REPLACEMENT (AVR) USING INSPIRIS SIZE 23MM  (N/A) TRANSESOPHAGEAL ECHOCARDIOGRAM (N/A)  POD 3  Hemodynamically stable in sinus rhythm. Continue Lopressor.  Postop atrial fib: switch IV to po amio.  DC sleeve today.  Volume excess: Wt unchanged. 13 lbs over preop if accurate. Continue lasix and 2.5 metolazone today. Continue KCL. Check labs tomorrow.  Plan home Saturday most likely, possibly tomorrow if maintaining sinus and feels great, bowels move. She is going home to [place] with her son.   LOS: 3 days    Gaye Pollack 08/14/2022

## 2022-08-14 NOTE — Care Management Important Message (Signed)
Important Message  Patient Details  Name: MICKENZIE PRIMACK MRN: [id number] Date of Birth: [birth year]   Medicare Important Message Given:  Yes     Shelda Altes 08/14/2022, 8:49 AM

## 2022-08-15 LAB — CBC
HCT: 28.9 % — ABNORMAL LOW (ref 36.0–46.0)
Hemoglobin: 9.7 g/dL — ABNORMAL LOW (ref 12.0–15.0)
MCH: 30.9 pg (ref 26.0–34.0)
MCHC: 33.6 g/dL (ref 30.0–36.0)
MCV: 92 fL (ref 80.0–100.0)
Platelets: 208 10*3/uL (ref 150–400)
RBC: 3.14 MIL/uL — ABNORMAL LOW (ref 3.87–5.11)
RDW: 13.5 % (ref 11.5–15.5)
WBC: 12.8 10*3/uL — ABNORMAL HIGH (ref 4.0–10.5)
nRBC: 0 % (ref 0.0–0.2)

## 2022-08-15 LAB — GLUCOSE, CAPILLARY
Glucose-Capillary: 133 mg/dL — ABNORMAL HIGH (ref 70–99)
Glucose-Capillary: 152 mg/dL — ABNORMAL HIGH (ref 70–99)
Glucose-Capillary: 130 mg/dL — ABNORMAL HIGH (ref 70–99)
Glucose-Capillary: 149 mg/dL — ABNORMAL HIGH (ref 70–99)

## 2022-08-15 LAB — BASIC METABOLIC PANEL
Anion gap: 10 (ref 5–15)
BUN: 9 mg/dL (ref 6–20)
CO2: 27 mmol/L (ref 22–32)
Calcium: 8.8 mg/dL — ABNORMAL LOW (ref 8.9–10.3)
Chloride: 102 mmol/L (ref 98–111)
Creatinine, Ser: 0.79 mg/dL (ref 0.44–1.00)
GFR, Estimated: >60 mL/min (ref >60)
Glucose, Bld: 126 mg/dL — ABNORMAL HIGH (ref 70–99)
Potassium: 4 mmol/L (ref 3.5–5.1)
Sodium: 139 mmol/L (ref 135–145)

## 2022-08-15 MED ORDER — METOLAZONE 5 MG PO TABS: 2.5000 mg | ORAL_TABLET | Freq: Once | ORAL | Status: DC

## 2022-08-15 MED ORDER — METOLAZONE 5 MG PO TABS
5.0000 mg | ORAL_TABLET | Freq: Once | ORAL | Status: AC
  Administered 2022-08-15: 5 mg via ORAL
  Filled 2022-08-15: qty 1

## 2022-08-15 MED ORDER — POTASSIUM CHLORIDE CRYS ER 20 MEQ PO TBCR
20.0000 meq | EXTENDED_RELEASE_TABLET | Freq: Three times a day (TID) | ORAL | Status: AC
  Administered 2022-08-15 – 2022-08-16 (×6): 20 meq via ORAL
  Filled 2022-08-15 (×6): qty 1

## 2022-08-15 MED FILL — Electrolyte-R (PH 7.4) Solution: INTRAVENOUS | Qty: 4000 | Status: AC

## 2022-08-15 MED FILL — Sodium Chloride IV Soln 0.9%: INTRAVENOUS | Qty: 2000 | Status: AC

## 2022-08-15 MED FILL — Heparin Sodium (Porcine) Inj 1000 Unit/ML: INTRAMUSCULAR | Qty: 10 | Status: AC

## 2022-08-15 MED FILL — Mannitol IV Soln 20%: INTRAVENOUS | Qty: 500 | Status: AC

## 2022-08-15 MED FILL — Sodium Bicarbonate IV Soln 8.4%: INTRAVENOUS | Qty: 50 | Status: AC

## 2022-08-15 NOTE — Progress Notes (Addendum)
CARDIAC REHAB PHASE I   PRE:  Rate/Rhythm: 74 NSR  BP:  Sitting: 129/70      SaO2: 95 RA  MODE:  Ambulation: 470 ft   AD:  None  POST:  Rate/Rhythm: 86 NSR  BP:  Sitting: 142/67      SaO2: 95 RA  Pt amb with standby assistance, pt denies CP and SOB during amb and was returned to room w/o complaint.   Pt received OHS book and education on restrictions, heart healthy diet, diabetic diet,  ex guidelines, Move in the Tube sheet, incentive spirometer use when d/c and CRPII. Pt denies questions and was encouraged to look in the book for additional information. Referral placed to [facility].    Christen Bame  9:50 AM 08/15/2022    Service time is from 0923 to 0954.

## 2022-08-15 NOTE — Progress Notes (Signed)
4 Days Post-Op Procedure(s) (LRB): AORTIC VALVE REPLACEMENT (AVR) USING INSPIRIS SIZE 23MM (N/A) TRANSESOPHAGEAL ECHOCARDIOGRAM (N/A) Subjective: No complaints. Ambulating well. Bowels working.  No further atrial fib.  Objective: Vital signs in last 24 hours: Temp:  [98.3 F (36.8 C)-98.9 F (37.2 C)] 98.3 F (36.8 C) (03/08 0304) Pulse Rate:  [74-88] 74 (03/08 0304) Cardiac Rhythm: Normal sinus rhythm (03/07 1900) Resp:  [19-23] 19 (03/08 0304) BP: (106-130)/(44-67) 115/61 (03/08 0304) SpO2:  [91 %-99 %] 93 % (03/08 0304) Weight:  [114.2 kg] 114.2 kg (03/08 0500)  Hemodynamic parameters for last 24 hours:    Intake/Output from previous day: No intake/output data recorded. Intake/Output this shift: No intake/output data recorded.  General appearance: alert and cooperative Neurologic: intact Heart: regular rate and rhythm, S1, S2 normal, no murmur Lungs: clear to auscultation bilaterally Extremities: edema mild Wound: incision healing well  Lab Results: Recent Labs    08/13/22 0405 08/15/22 0118  WBC 12.9* 12.8*  HGB 9.7* 9.7*  HCT 30.1* 28.9*  PLT 151 208   BMET:  Recent Labs    08/13/22 0405 08/15/22 0118  NA 135 139  K 4.3 4.0  CL 100 102  CO2 26 27  GLUCOSE 205* 126*  BUN 11 9  CREATININE 0.79 0.79  CALCIUM 8.2* 8.8*    PT/INR: No results for input(s): "LABPROT", "INR" in the last 72 hours. ABG    Component Value Date/Time   PHART 7.278 (L) 08/11/2022 1533   HCO3 22.3 08/11/2022 1533   TCO2 24 08/13/2022 0009   ACIDBASEDEF 5.0 (H) 08/11/2022 1533   O2SAT 99 08/11/2022 1533   CBG (last 3)  Recent Labs    08/14/22 1614 08/14/22 2114 08/15/22 0604  GLUCAP 138* 138* 133*    Assessment/Plan: S/P Procedure(s) (LRB): AORTIC VALVE REPLACEMENT (AVR) USING INSPIRIS SIZE 23MM (N/A) TRANSESOPHAGEAL ECHOCARDIOGRAM (N/A)  POD 4  Hemodynamically stable in sinus rhythm. Continue Lopressor and amiodarone.  Wt down about a lb from yesterday.  Still 10-11 lbs over preop. Will give lasix and metolazone again today and continue KCl. She will need to go home on lasix and KCL for a week or so.  Continue IS, ambulation  DC pacing wires today.  Plan home tomorrow.   LOS: 4 days    Gaye Pollack 08/15/2022

## 2022-08-15 NOTE — Progress Notes (Signed)
Pt in afib RVR from 2012-2100, rates 130s. Gave PO meds early at 2022. Pt stated she felt her chest was "heavy" during episode.  Now currently resting in bed, NSR 80s

## 2022-08-15 NOTE — Progress Notes (Signed)
Pacing wires removed. Site clean dry and intact , Pt tolerated well, educated on bedrest. VSS,will continue to monitor .   Phoebe Sharps, RN

## 2022-08-16 LAB — GLUCOSE, CAPILLARY
Glucose-Capillary: 126 mg/dL — ABNORMAL HIGH (ref 70–99)
Glucose-Capillary: 138 mg/dL — ABNORMAL HIGH (ref 70–99)
Glucose-Capillary: 111 mg/dL — ABNORMAL HIGH (ref 70–99)
Glucose-Capillary: 126 mg/dL — ABNORMAL HIGH (ref 70–99)
Glucose-Capillary: 126 mg/dL — ABNORMAL HIGH (ref 70–99)

## 2022-08-16 MED ORDER — FUROSEMIDE 40 MG PO TABS
40.0000 mg | ORAL_TABLET | Freq: Every day | ORAL | Status: DC
  Administered 2022-08-16 – 2022-08-17 (×2): 40 mg via ORAL
  Filled 2022-08-16 (×2): qty 1

## 2022-08-16 MED ORDER — METOPROLOL TARTRATE 25 MG PO TABS
25.0000 mg | ORAL_TABLET | Freq: Two times a day (BID) | ORAL | Status: DC
  Administered 2022-08-16 – 2022-08-17 (×3): 25 mg via ORAL
  Filled 2022-08-16 (×3): qty 1

## 2022-08-16 NOTE — Progress Notes (Addendum)
      [address]       [place] [address]             [phone]      5 Days Post-Op Procedure(s) (LRB): AORTIC VALVE REPLACEMENT (AVR) USING INSPIRIS SIZE 23MM (N/A) TRANSESOPHAGEAL ECHOCARDIOGRAM (N/A)  Subjective:  Patient sitting up in chair.  Overall she is feeling good.  + ambulation   + BM  Objective: Vital signs in last 24 hours: Temp:  [98 F (36.7 C)-98.9 F (37.2 C)] 98 F (36.7 C) (03/09 0328) Pulse Rate:  [70-83] 71 (03/09 0328) Cardiac Rhythm: Normal sinus rhythm (03/08 2103) Resp:  [18-21] 18 (03/09 0328) BP: (111-146)/(67-79) 122/73 (03/09 0328) SpO2:  [91 %-97 %] 95 % (03/09 0328) Weight:  [112.7 kg] 112.7 kg (03/09 0328)  General appearance: alert, cooperative, and no distress Heart: regular rate and rhythm Lungs: clear to auscultation bilaterally Abdomen: soft, non-tender; bowel sounds normal; no masses,  no organomegaly Extremities: edema trace Wound: clean and dry  Lab Results: Recent Labs    08/15/22 0118  WBC 12.8*  HGB 9.7*  HCT 28.9*  PLT 208   BMET:  Recent Labs    08/15/22 0118  NA 139  K 4.0  CL 102  CO2 27  GLUCOSE 126*  BUN 9  CREATININE 0.79  CALCIUM 8.8*    PT/INR: No results for input(s): "LABPROT", "INR" in the last 72 hours. ABG    Component Value Date/Time   PHART 7.278 (L) 08/11/2022 1533   HCO3 22.3 08/11/2022 1533   TCO2 24 08/13/2022 0009   ACIDBASEDEF 5.0 (H) 08/11/2022 1533   O2SAT 99 08/11/2022 1533   CBG (last 3)  Recent Labs    08/15/22 1607 08/15/22 2136 08/16/22 0611  GLUCAP 130* 149* 126*    Assessment/Plan: S/P Procedure(s) (LRB): AORTIC VALVE REPLACEMENT (AVR) USING INSPIRIS SIZE 23MM (N/A) TRANSESOPHAGEAL ECHOCARDIOGRAM (N/A)  CV- Brief run of Atrial Fibrillation with RVR last night- continue Amiodarone 400 mg BID, increase Lopressor to 25 mg BID Pulm- no acute issues, continue IS Renal-creatinine stable, weight is trending down, continue Lasix,  potassium Expected post operative blood loss anemia  Dispo- patient stable, brief Atrial Fibrillation overnight, increase Lopressor for additional HR control, continue diuretics.[name] will observe today if no further Atrial Fibrillation will plan for d/c in AM   LOS: 5 days    Ellwood Handler, PA-C 08/16/2022  Patient seen and examined, agree with findings, A/P above  Remo Lipps C. Roxan Hockey, MD [facility] [phone]

## 2022-08-16 NOTE — Progress Notes (Signed)
Mobility Specialist - Progress Note   08/16/22 1100  Mobility  Activity Ambulated with assistance in hallway  Level of Assistance Standby assist, set-up cues, supervision of patient - no hands on  Assistive Device None  Distance Ambulated (ft) 500 ft  Activity Response Tolerated well  Mobility Referral Yes  $Mobility charge 1 Mobility    Pt received in recliner and agreeable. No complaints on walk. Left in recliner w/ call bell in reach and all needs met.   [place] Specialist Please contact via SecureChat or Rehab office at [phone]

## 2022-08-16 NOTE — Progress Notes (Signed)
CARDIAC REHAB PHASE I   PRE:  Rate/Rhythm: 74 SR   BP:  Sitting: 128/80      MODE:  Ambulation: 470 ft   POST:  Rate/Rhythm: 83 SR  BP:  Sitting: 148/84  Pt amb in hallway independently with standby assist, tolerated well with no s/sx. Pt up from recliner following sternal precautions well. Returned to [employer], no questions regarding education. Call bell left in reach.  [place], MS 08/16/2022 9:56 AM

## 2022-08-17 LAB — GLUCOSE, CAPILLARY: Glucose-Capillary: 125 mg/dL — ABNORMAL HIGH (ref 70–99)

## 2022-08-17 MED ORDER — AMIODARONE HCL 200 MG PO TABS: 200.0000 mg | ORAL_TABLET | Freq: Two times a day (BID) | ORAL | 2 refills | Status: DC

## 2022-08-17 MED ORDER — FUROSEMIDE 40 MG PO TABS: 40.0000 mg | ORAL_TABLET | Freq: Every day | ORAL | 0 refills | Status: AC

## 2022-08-17 MED ORDER — ASPIRIN 325 MG PO TBEC: 325.0000 mg | DELAYED_RELEASE_TABLET | Freq: Every day | ORAL | Status: DC

## 2022-08-17 MED ORDER — METOPROLOL TARTRATE 25 MG PO TABS: 25.0000 mg | ORAL_TABLET | Freq: Two times a day (BID) | ORAL | 3 refills | Status: DC

## 2022-08-17 MED ORDER — OXYCODONE HCL 5 MG PO TABS: 5.0000 mg | ORAL_TABLET | ORAL | 0 refills | Status: DC | PRN

## 2022-08-17 NOTE — Progress Notes (Signed)
      [address]       [place] [address]             [phone]      6 Days Post-Op Procedure(s) (LRB): AORTIC VALVE REPLACEMENT (AVR) USING INSPIRIS SIZE 23MM (N/A) TRANSESOPHAGEAL ECHOCARDIOGRAM (N/A)  Subjective:  Patient without complaints.  Continues to feel well.  + ambulation   + BM  Objective: Vital signs in last 24 hours: Temp:  [97.6 F (36.4 C)-98.9 F (37.2 C)] 98.1 F (36.7 C) (03/10 0354) Pulse Rate:  [68-79] 68 (03/10 0354) Cardiac Rhythm: Normal sinus rhythm (03/09 1935) Resp:  [19-21] 20 (03/10 0354) BP: (105-134)/(58-80) 108/71 (03/10 0354) SpO2:  [92 %-96 %] 95 % (03/10 0354) Weight:  [111.9 kg] 111.9 kg (03/10 0354)  Intake/Output from previous day: 03/09 0701 - 03/10 0700 In: 1220 [P.O.:1220] Out: 1 [Urine:1]  General appearance: alert, cooperative, and no distress Heart: regular rate and rhythm Lungs: clear to auscultation bilaterally Abdomen: soft, non-tender; bowel sounds normal; no masses,  no organomegaly Extremities: edema trace Wound: clean and dry  Lab Results: Recent Labs    08/15/22 0118  WBC 12.8*  HGB 9.7*  HCT 28.9*  PLT 208   BMET:  Recent Labs    08/15/22 0118  NA 139  K 4.0  CL 102  CO2 27  GLUCOSE 126*  BUN 9  CREATININE 0.79  CALCIUM 8.8*    PT/INR: No results for input(s): "LABPROT", "INR" in the last 72 hours. ABG    Component Value Date/Time   PHART 7.278 (L) 08/11/2022 1533   HCO3 22.3 08/11/2022 1533   TCO2 24 08/13/2022 0009   ACIDBASEDEF 5.0 (H) 08/11/2022 1533   O2SAT 99 08/11/2022 1533   CBG (last 3)  Recent Labs    08/16/22 1533 08/16/22 2110 08/17/22 0613  GLUCAP 126* 126* 125*    Assessment/Plan: S/P Procedure(s) (LRB): AORTIC VALVE REPLACEMENT (AVR) USING INSPIRIS SIZE 23MM (N/A) TRANSESOPHAGEAL ECHOCARDIOGRAM (N/A)  CV- PAF, maintaining NSR- continue Amiodarone, Lopressor Pulm- no acute issues, continue IS  Renal- weight remains elevated trending down,  continue Lasix, potassium Dispo- patient doing well, no further Atrial Fibrillation, continue Lasix.. for d/c home today   LOS: 6 days    Ellwood Handler, PA-C 08/17/2022

## 2022-08-17 NOTE — Final Progress Note (Signed)
Patient alert and oriented, verbalized understanding of dc instructions. All belongings given to patient 

## 2022-08-18 MED FILL — Lidocaine HCl Local Preservative Free (PF) Inj 2%: INTRAMUSCULAR | Qty: 14 | Status: AC

## 2022-08-18 MED FILL — Heparin Sodium (Porcine) Inj 1000 Unit/ML: Qty: 1000 | Status: AC

## 2022-08-18 MED FILL — Potassium Chloride Inj 2 mEq/ML: INTRAVENOUS | Qty: 40 | Status: AC

## 2022-08-18 MED FILL — Magnesium Sulfate Inj 50%: INTRAMUSCULAR | Qty: 10 | Status: CN

## 2022-08-25 ENCOUNTER — Ambulatory Visit: Payer: Self-pay | Admitting: *Deleted

## 2022-08-25 DIAGNOSIS — Z4802 Encounter for removal of sutures: Secondary | ICD-10-CM

## 2022-08-25 NOTE — Progress Notes (Signed)
Patient arrived for nurse visit to remove sutures post- AVR 3/4 by Dr. Cyndia Bent.  Two sutures removed with no signs or symptoms of infection noted.  Incisions well approximated. Patient tolerated suture removal well.  Patient instructed to keep the incision site clean and dry. Patient acknowledged instructions given.  All questions answered.

## 2022-08-27 ENCOUNTER — Encounter: Payer: Self-pay | Admitting: Obstetrics & Gynecology

## 2022-09-01 ENCOUNTER — Ambulatory Visit: Payer: Medicare HMO | Attending: Nurse Practitioner | Admitting: Nurse Practitioner

## 2022-09-01 ENCOUNTER — Encounter: Payer: Self-pay | Admitting: Nurse Practitioner

## 2022-09-01 VITALS — BP 117/83 | HR 125 | Ht 65.0 in | Wt 243.0 lb

## 2022-09-01 DIAGNOSIS — E785 Hyperlipidemia, unspecified: Secondary | ICD-10-CM | POA: Diagnosis not present

## 2022-09-01 DIAGNOSIS — I10 Essential (primary) hypertension: Secondary | ICD-10-CM | POA: Diagnosis not present

## 2022-09-01 DIAGNOSIS — I9789 Other postprocedural complications and disorders of the circulatory system, not elsewhere classified: Secondary | ICD-10-CM

## 2022-09-01 DIAGNOSIS — I4891 Unspecified atrial fibrillation: Secondary | ICD-10-CM

## 2022-09-01 DIAGNOSIS — Z952 Presence of prosthetic heart valve: Secondary | ICD-10-CM

## 2022-09-01 DIAGNOSIS — E118 Type 2 diabetes mellitus with unspecified complications: Secondary | ICD-10-CM | POA: Diagnosis not present

## 2022-09-01 DIAGNOSIS — I350 Nonrheumatic aortic (valve) stenosis: Secondary | ICD-10-CM

## 2022-09-01 DIAGNOSIS — E1169 Type 2 diabetes mellitus with other specified complication: Secondary | ICD-10-CM | POA: Diagnosis not present

## 2022-09-01 MED ORDER — APIXABAN 5 MG PO TABS: 5.0000 mg | ORAL_TABLET | Freq: Two times a day (BID) | ORAL | 6 refills | Status: DC

## 2022-09-01 MED ORDER — METOPROLOL TARTRATE 25 MG PO TABS: 37.5000 mg | ORAL_TABLET | Freq: Two times a day (BID) | ORAL | 3 refills | Status: DC

## 2022-09-01 NOTE — Patient Instructions (Signed)
Medication Instructions:  Start Eliquis 5 MG twice daily.  Increase Metoprolol 37.5 MG twice daily.  *If you need a refill on your cardiac medications before your next appointment, please call your pharmacy*   Lab Work: None ordered If you have labs (blood work) drawn today and your tests are completely normal, you will receive your results only by: [place] (if you have MyChart) OR A paper copy in the mail If you have any lab test that is abnormal or we need to change your treatment, we will call you to review the results.   Testing/Procedures: None ordered   Follow-Up: At [facility], you and your health needs are our priority.  As part of our continuing mission to provide you with exceptional heart care, we have created designated Provider Care Teams.  These Care Teams include your primary Cardiologist (physician) and Advanced Practice Providers (APPs -  Physician Assistants and Nurse Practitioners) who all work together to provide you with the care you need, when you need it.  We recommend signing up for the patient portal called "MyChart".  Sign up information is provided on this After Visit Summary.  MyChart is used to connect with patients for Virtual Visits (Telemedicine).  Patients are able to view lab/test results, encounter notes, upcoming appointments, etc.  Non-urgent messages can be sent to your provider as well.   To learn more about what you can do with MyChart, go to [email or web address].    Your next appointment:   3 week(s)  Provider:   Diona Browner, NP        Other Instructions

## 2022-09-01 NOTE — Progress Notes (Signed)
Office Visit    Patient Name: Nancy Oliver Date of Encounter: 09/01/2022  Primary Care Provider:  Lennie Odor, [place] Primary Cardiologist:  Minus Breeding, MD  Chief Complaint    62 year old female with a history of bicuspid aortic valve, severe aortic stenosis s/p AVR in 08/2022, postop atrial fibrillation, palpitations, hypertension, hyperlipidemia, and type 2 diabetes who presents for hospital follow-up related to AS s/p AVR.  Past Medical History    Past Medical History:  Diagnosis Date   Anxiety    Arthritis    Depression    Diabetes mellitus without complication (HCC)    GERD (gastroesophageal reflux disease)    Heart murmur    Hyperlipidemia    Hypertension    Migraines    Neuropathy    Obesity    Right sided sciatica    Past Surgical History:  Procedure Laterality Date   AORTIC VALVE REPLACEMENT N/A 08/11/2022   Procedure: AORTIC VALVE REPLACEMENT (AVR) USING INSPIRIS SIZE 23MM;  Surgeon: Gaye Pollack, MD;  Location: [facility];  Service: Open Heart Surgery;  Laterality: N/A;   BREAST SURGERY Left 04/2014   biopsy    CESAREAN SECTION     HYSTEROSCOPY WITH D & C N/A 08/20/2021   Procedure: DILATATION AND CURETTAGE /HYSTEROSCOPY;  Surgeon: Janyth Pupa, DO;  Location: [facility];  Service: Gynecology;  Laterality: N/A;   KNEE SURGERY     LEFT HEART CATH AND CORONARY ANGIOGRAPHY N/A 07/09/2022   Procedure: LEFT HEART CATH AND CORONARY ANGIOGRAPHY;  Surgeon: Early Osmond, MD;  Location: [facility];  Service: Cardiovascular;  Laterality: N/A;   NASAL SEPTOPLASTY W/ TURBINOPLASTY     POLYPECTOMY N/A 08/20/2021   Procedure: POLYPECTOMY(WITH MYOSURE DEVICE);  Surgeon: Janyth Pupa, DO;  Location: [facility];  Service: Gynecology;  Laterality: N/A;   RIGHT HEART CATH N/A 07/09/2022   Procedure: RIGHT HEART CATH;  Surgeon: Early Osmond, MD;  Location: [facility];  Service: Cardiovascular;  Laterality: N/A;   TEE WITHOUT CARDIOVERSION N/A 08/11/2022    Procedure: TRANSESOPHAGEAL ECHOCARDIOGRAM;  Surgeon: Gaye Pollack, MD;  Location: [facility];  Service: Open Heart Surgery;  Laterality: N/A;    Allergies  No Known Allergies   Labs/Other Studies Reviewed    The following studies were reviewed today: Echo 04/2022: IMPRESSIONS     1. Bicuspid aortic valve with severe AS (mean gradient 38 mmHg; peak  velocity of 4.1 m/s); LVOT gradient of 3.2 m/s with valsalva.   2. Left ventricular ejection fraction, by estimation, is 60 to 65%. The  left ventricle has normal function. The left ventricle has no regional  wall motion abnormalities. There is mild left ventricular hypertrophy.  Left ventricular diastolic parameters  are consistent with Grade I diastolic dysfunction (impaired relaxation).  The average left ventricular global longitudinal strain is -20.4 %. The  global longitudinal strain is normal.   3. Right ventricular systolic function is normal. The right ventricular  size is normal.   4. The mitral valve is normal in structure. No evidence of mitral valve  regurgitation. No evidence of mitral stenosis. Moderate mitral annular  calcification.   5. The aortic valve is bicuspid. Aortic valve regurgitation is trivial.  Severe aortic valve stenosis.   6. The inferior vena cava is normal in size with greater than 50%  respiratory variability, suggesting right atrial pressure of 3 mmHg.   Comparison(s): 10/08/21 EF 60-65%. Moderate-severe AS 24mmHg mean PG, 21mmHg  peak PG.   R/LHC 06/2022:  1.  Normal left dominant circulation. 2.  Fick cardiac output of 7.2 L/min, Fick cardiac index of 3.3 L/min/m with mean RA pressure of 5 mmHg, RV pressure of 30/1 with an RV end-diastolic pressure of 8 mmHg, mean wedge pressure of 5 mmHg, and mean PA pressure of 16 mmHg.   Recommendation: Cardiothoracic surgical evaluation.    Recent Labs: 08/07/2022: ALT 16 08/12/2022: Magnesium 2.1 08/15/2022: BUN 9; Creatinine, Ser 0.79; Hemoglobin 9.7; Platelets  208; Potassium 4.0; Sodium 139  Recent Lipid Panel    Component Value Date/Time   CHOL 189 06/17/2013 0946   TRIG 82 06/17/2013 0946   HDL 56 06/17/2013 0946   CHOLHDL 3.4 06/17/2013 0946   VLDL 16 06/17/2013 0946   LDLCALC 117 (H) 06/17/2013 0946    History of Present Illness    62 year old female with the above past medical history including bicuspid aortic valve, severe aortic stenosis s/p AVR in 08/2022, postop atrial fibrillation, palpitations, hypertension, hyperlipidemia, and type 2 diabetes.  Echocardiogram in 2020 showed EF 60 to 65%, mild aortic stenosis, tricuspid valve with mild calcification.  ETT in 08/2020 was normal.  Most recent echocardiogram in 11/23 revealed bicuspid aortic valve with severe AS (mean gradient 38 mmHg), EF 60 to 65%, normal LV function, no RWMA, mild LVH, G1 DD, normal RV function. She was last seen in the office on 07/03/2022 and was stable from a cardiac standpoint though she did note dyspnea on exertion. R/LHC in anticipation of surgery in 06/2022 showed no evidence of CAD.  She was referred to CT surgery and underwent elective aortic valve replacement with 23 mm Inspiris valve on 08/11/2022.  Postop course was complicated by atrial fibrillation.  She was started on amiodarone and converted to NSR.  Anticoagulation was deferred.  She was discharged home in stable condition on 08/17/2022.  She presents today for follow-up accompanied by her son.  Since her hospitalization she has been stable from a cardiac standpoint.  Fortunately, she is in atrial flutter, HR 125 bpm.  She does note increased fatigue over the weekend.  She states her heart rate has been mildly elevated.  She denies any chest pain, palpitations, dizziness, presyncope, syncope, dyspnea, edema, PND, orthopnea, weight gain.  Other than her recent fatigue, she reports feeling well.  Home Medications    Current Outpatient Medications  Medication Sig Dispense Refill   ACCU-CHEK AVIVA PLUS test strip       allopurinol (ZYLOPRIM) 300 MG tablet Take 300 mg by mouth daily.     ALPRAZolam (XANAX) 0.5 MG tablet TAKE ONE TABLET BY MOUTH EVERY 8 HOURS AS NEEDED 60 tablet 0   amiodarone (PACERONE) 200 MG tablet Take 200 mg by mouth 2 (two) times daily.     apixaban (ELIQUIS) 5 MG TABS tablet Take 1 tablet (5 mg total) by mouth 2 (two) times daily. 60 tablet 6   aspirin EC 325 MG tablet Take 1 tablet (325 mg total) by mouth daily.     atorvastatin (LIPITOR) 80 MG tablet Take 80 mg by mouth daily.     Blood Glucose Monitoring Suppl (ACCU-CHEK AVIVA PLUS) w/Device KIT See admin instructions.     DULoxetine (CYMBALTA) 60 MG capsule Take 60 mg by mouth daily as needed (anxiety).     furosemide (LASIX) 40 MG tablet Take 1 tablet (40 mg total) by mouth daily. X 7 days, then you may resume 20 mg daily prn edema 7 tablet 0   gabapentin (NEURONTIN) 300 MG capsule Take 300-600 mg by  mouth See admin instructions. Take 300 mg in the morning, 300 mg in the afternoon, and 600 mg at bedtime     metFORMIN (GLUCOPHAGE) 1000 MG tablet Take 1,000 mg by mouth 2 (two) times daily with a meal.     metoprolol tartrate (LOPRESSOR) 25 MG tablet Take 1.5 tablets (37.5 mg total) by mouth 2 (two) times daily. 180 tablet 3   norethindrone (MICRONOR) 0.35 MG tablet Take 1 tablet by mouth daily.     omeprazole (PRILOSEC) 40 MG capsule Take 40 mg by mouth daily.     oxyCODONE (OXY IR/ROXICODONE) 5 MG immediate release tablet Take 1 tablet (5 mg total) by mouth every 4 (four) hours as needed for severe pain. 30 tablet 0   Polyethyl Glycol-Propyl Glycol (SYSTANE OP) Place 1 drop into both eyes daily.     progesterone (PROMETRIUM) 100 MG capsule Take 1 capsule (100 mg total) by mouth daily. 90 capsule 4   TRESIBA FLEXTOUCH 100 UNIT/ML SOPN FlexTouch Pen Inject 25 Units into the skin daily.     TRULICITY 4.5 0000000 SOPN Inject 4.5 mg into the skin every Sunday.     No current facility-administered medications for this visit.      Review of Systems    She denies chest pain, palpitations, dyspnea, pnd, orthopnea, n, v, dizziness, syncope, edema, weight gain, or early satiety. All other systems reviewed and are otherwise negative except as noted above.      Cardiac Rehabilitation Eligibility Assessment  The patient is ready to start cardiac rehabilitation pending clearance from the cardiac surgeon.     Physical Exam    VS:  BP 117/83 (BP Location: Left Arm, Patient Position: Sitting, Cuff Size: Normal)   Pulse (!) 125   Ht 5\' 5"  (1.651 m)   Wt 243 lb (110.2 kg)   LMP 10/07/2012   BMI 40.44 kg/m   GEN: Well nourished, well developed, in no acute distress. HEENT: normal. Neck: Supple, no JVD, carotid bruits, or masses. Cardiac: RRR, no murmurs, rubs, or gallops. No clubbing, cyanosis, edema.  Radials/DP/PT 2+ and equal bilaterally.  Respiratory:  Respirations regular and unlabored, clear to auscultation bilaterally. GI: Soft, nontender, nondistended, BS + x 4. MS: no deformity or atrophy. Skin: warm and dry, no rash. Neuro:  Strength and sensation are intact. Psych: Normal affect.  Accessory Clinical Findings    ECG personally reviewed by me today -atrial flutter, 125 bpm- no acute changes.   Lab Results  Component Value Date   WBC 12.8 (H) 08/15/2022   HGB 9.7 (L) 08/15/2022   HCT 28.9 (L) 08/15/2022   MCV 92.0 08/15/2022   PLT 208 08/15/2022   Lab Results  Component Value Date   CREATININE 0.79 08/15/2022   BUN 9 08/15/2022   NA 139 08/15/2022   K 4.0 08/15/2022   CL 102 08/15/2022   CO2 27 08/15/2022   Lab Results  Component Value Date   ALT 16 08/07/2022   AST 20 08/07/2022   ALKPHOS 94 08/07/2022   BILITOT 0.5 08/07/2022   Lab Results  Component Value Date   CHOL 189 06/17/2013   HDL 56 06/17/2013   LDLCALC 117 (H) 06/17/2013   TRIG 82 06/17/2013   CHOLHDL 3.4 06/17/2013    Lab Results  Component Value Date   HGBA1C 6.4 (H) 08/07/2022    Assessment & Plan    1.  Aortic stenosis: Severe, s/p AVR in on 08/11/2022.  She has follow-up scheduled with CT surgery.  Euvolemic and well  compensated on exam. Postop echo has been scheduled for 09/21/2022.  Continue Lasix as needed.  2. Paroxysmal atrial fibrillation/atrial flutter: Occurred in the postop setting, she initially converted with p.o. amiodarone, however, she is in atrial flutter today, HR 125 bpm.  CHA2DS2VASc = 3.  She does note some increased fatigue over the past several days.  Discussed with Dr. Claiborne Billings, DOD.  Will start Eliquis 5 mg twice daily.  Will increase metoprolol to 37.5 mg twice daily.  Will increase amiodarone to 200 mg twice daily until follow-up.  Discused possible need for DCCV, possible referral to A-fib clinic.  Continue to monitor BP, HR. Plan for close follow-up.   3. Hypertension: BP well controlled. Continue current antihypertensive regimen.   4. Hyperlipidemia: LDL was 122 in 04/2022.  Continue Lipitor.  Consider repeat fasting lipids, LFTs at next follow-up visit.  5. Type 2 diabetes: A1c was 6.4 in 07/2022. Monitored and managed per PCP.   6. Disposition: Follow-up in 3 weeks.      Lenna Sciara, NP 09/01/2022, 12:07 PM

## 2022-09-02 ENCOUNTER — Ambulatory Visit: Payer: Medicare HMO | Admitting: Obstetrics & Gynecology

## 2022-09-02 ENCOUNTER — Encounter: Payer: Self-pay | Admitting: Obstetrics & Gynecology

## 2022-09-02 VITALS — BP 102/77 | HR 118 | Ht 65.0 in | Wt 244.6 lb

## 2022-09-02 DIAGNOSIS — N898 Other specified noninflammatory disorders of vagina: Secondary | ICD-10-CM

## 2022-09-02 DIAGNOSIS — N950 Postmenopausal bleeding: Secondary | ICD-10-CM

## 2022-09-02 DIAGNOSIS — E118 Type 2 diabetes mellitus with unspecified complications: Secondary | ICD-10-CM

## 2022-09-02 DIAGNOSIS — Z952 Presence of prosthetic heart valve: Secondary | ICD-10-CM

## 2022-09-02 NOTE — Progress Notes (Signed)
   GYN VISIT Patient name: Nancy Oliver MRN [id number]  Date of birth: 1960/12/18 Chief Complaint:   Vaginal Discharge (Having brown discharge)  History of Present Illness:   Nancy Oliver is a 62 y.o. [id number] PM female being seen today for the following concerns:     [place] discharge: few days last week- when she voided and wiped some some on the toilet paper.  Did not require a pad.  No further bleeding/spotting since then.  Taking progesterone regularly.  03/2022: SHG- thickened 6.72mm lining with hypoechoic irregularity posterior wall. 08/2021: Hysteroscopy, D&C polypectomy- benign  Of note, pt recently underwent valve replacement- started on blood thinner.  Patient's last menstrual period was 10/07/2012.     06/19/2021   11:19 AM 09/13/2015    7:17 PM  Depression screen PHQ 2/9  Decreased Interest 3 0  Down, Depressed, Hopeless 3 1  PHQ - 2 Score 6 1  Altered sleeping 3   Tired, decreased energy 3   Change in appetite 0   Feeling bad or failure about yourself  0   Trouble concentrating 0   Moving slowly or fidgety/restless 0   Suicidal thoughts 0   PHQ-9 Score 12      Review of Systems:   Pertinent items are noted in HPI Denies fever/chills, dizziness, headaches, visual disturbances, fatigue, shortness of breath, chest pain, abdominal pain, vomiting, no problems with bowel movements or urination. Pertinent History Reviewed:  Reviewed past medical,surgical, social, obstetrical and family history.  Reviewed problem list, medications and allergies. Physical Assessment:   Vitals:   09/02/22 1127  BP: 102/77  Pulse: (!) 118  Weight: 244 lb 9.6 oz (110.9 kg)  Height: 5\' 5"  (1.651 m)  Body mass index is 40.7 kg/m.       Physical Examination:   General appearance: alert, well appearing, and in no distress  Psych: mood appropriate, normal affect  Skin: warm & dry   Cardiovascular: normal heart rate noted  Respiratory: normal respiratory effort, no  distress  Pelvic: VULVA: normal appearing vulva with no masses, tenderness or lesions, VAGINA: normal appearing vagina with normal color and discharge, no lesions CERVIX: normal appearing cervix without discharge or lesions- no visible bleeding noted UTERUS: uterus is normal size, shape, consistency and nontender  Extremities: no edema   Chaperone:  Dr. Leonides Sake     Assessment & Plan:  1) Vaginal brown discharge -no evidence of postmenopausal bleeding noted -plan to continue on current dose of progesterone. Discussed with patient concern for bleeding due to blood thinner; however, we will monitor at this time -prior EMB/polypectomy benign (08/2021) []  should brown discharge and/or bleeding return plan to increase progesterone -may also consider repeat imaging to re-evaluate endometrial lining   Return for Oct 2024 annual.   Janyth Pupa, DO Attending [place], [place] for [place], [place]

## 2022-09-09 ENCOUNTER — Other Ambulatory Visit: Payer: Self-pay | Admitting: Surgery

## 2022-09-09 DIAGNOSIS — Z952 Presence of prosthetic heart valve: Secondary | ICD-10-CM

## 2022-09-10 ENCOUNTER — Ambulatory Visit
Admission: RE | Admit: 2022-09-10 | Discharge: 2022-09-10 | Disposition: A | Discharge status: Home / Self Care | Payer: Medicare HMO | Source: Ambulatory Visit | Attending: Surgery | Admitting: Surgery

## 2022-09-10 ENCOUNTER — Encounter: Payer: Self-pay | Admitting: Surgery

## 2022-09-10 ENCOUNTER — Ambulatory Visit (INDEPENDENT_AMBULATORY_CARE_PROVIDER_SITE_OTHER): Payer: Self-pay | Admitting: Surgery

## 2022-09-10 VITALS — BP 111/82 | HR 67 | Resp 20 | Ht 65.0 in | Wt 243.0 lb

## 2022-09-10 DIAGNOSIS — Z952 Presence of prosthetic heart valve: Secondary | ICD-10-CM

## 2022-09-10 DIAGNOSIS — R079 Chest pain, unspecified: Secondary | ICD-10-CM | POA: Diagnosis not present

## 2022-09-10 DIAGNOSIS — I350 Nonrheumatic aortic (valve) stenosis: Secondary | ICD-10-CM

## 2022-09-10 NOTE — Progress Notes (Signed)
HPI: Patient returns for routine postoperative follow-up having undergone aortic valve replacement using a 23 mm INSPIRIS pericardial valve on 08/11/2022. The patient's early postoperative recovery while in the hospital was notable for development of postoperative atrial fibrillation converted with amiodarone.  She was discharged home on 200 mg twice daily and then after 7 days decreased to 200 mg daily. Since hospital discharge the patient was seen by cardiology on 09/01/2022 and reported some tachycardia.  Electrocardiogram showed atrial flutter with a ventricular rate of 125 bpm.  Her amiodarone was increased to 200 mg twice daily and metoprolol was increased to 37.5 mg twice daily.  She was started on Eliquis 5 mg twice daily.  She said that she still has noticed some tachycardia.  She still feels well overall without chest pain or shortness of breath.  He has had no dizziness.  Current Outpatient Medications  Medication Sig Dispense Refill   ACCU-CHEK AVIVA PLUS test strip      allopurinol (ZYLOPRIM) 300 MG tablet Take 300 mg by mouth daily.     ALPRAZolam (XANAX) 0.5 MG tablet TAKE ONE TABLET BY MOUTH EVERY 8 HOURS AS NEEDED 60 tablet 0   amiodarone (PACERONE) 200 MG tablet Take 200 mg by mouth 2 (two) times daily.     apixaban (ELIQUIS) 5 MG TABS tablet Take 1 tablet (5 mg total) by mouth 2 (two) times daily. 60 tablet 6   atorvastatin (LIPITOR) 80 MG tablet Take 80 mg by mouth daily.     Blood Glucose Monitoring Suppl (ACCU-CHEK AVIVA PLUS) w/Device KIT See admin instructions.     DULoxetine (CYMBALTA) 60 MG capsule Take 60 mg by mouth daily as needed (anxiety).     furosemide (LASIX) 40 MG tablet Take 1 tablet (40 mg total) by mouth daily. X 7 days, then you may resume 20 mg daily prn edema 7 tablet 0   gabapentin (NEURONTIN) 300 MG capsule Take 300-600 mg by mouth See admin instructions. Take 300 mg in the morning, 300 mg in the afternoon, and 600 mg at bedtime     metFORMIN (GLUCOPHAGE)  1000 MG tablet Take 1,000 mg by mouth 2 (two) times daily with a meal.     metoprolol tartrate (LOPRESSOR) 25 MG tablet Take 1.5 tablets (37.5 mg total) by mouth 2 (two) times daily. 180 tablet 3   norethindrone (MICRONOR) 0.35 MG tablet Take 1 tablet by mouth daily.     omeprazole (PRILOSEC) 40 MG capsule Take 40 mg by mouth daily.     Polyethyl Glycol-Propyl Glycol (SYSTANE OP) Place 1 drop into both eyes daily.     TRESIBA FLEXTOUCH 100 UNIT/ML SOPN FlexTouch Pen Inject 25 Units into the skin daily.     TRULICITY 4.5 0000000 SOPN Inject 3 mg into the skin every Sunday.     aspirin EC 325 MG tablet Take 1 tablet (325 mg total) by mouth daily. (Patient not taking: Reported on 09/10/2022)     oxyCODONE (OXY IR/ROXICODONE) 5 MG immediate release tablet Take 1 tablet (5 mg total) by mouth every 4 (four) hours as needed for severe pain. (Patient not taking: Reported on 09/10/2022) 30 tablet 0   progesterone (PROMETRIUM) 100 MG capsule Take 1 capsule (100 mg total) by mouth daily. 90 capsule 4   No current facility-administered medications for this visit.    Physical Exam: BP 111/82   Pulse 67   Resp 20   Ht 5\' 5"  (1.651 m)   Wt 243 lb (110.2 kg)   LMP  10/07/2012   SpO2 96% Comment: RA  BMI 40.44 kg/m  Cardiac exam shows a regularly irregular rate and rhythm.  There is no murmur. Lungs are clear. The chest incision is healing well and the sternum is stable. There is no peripheral edema.  Diagnostic Tests:  Narrative & Impression  CLINICAL DATA:  Mild chest pain with elevated heart rate. Aortic valve regurg   EXAM: CHEST - 2 VIEW   COMPARISON:  X-ray 08/13/2022 and older   FINDINGS: Sternal wires. Normal cardiopericardial silhouette. No consolidation, pneumothorax or effusion. No edema. Prosthetic aortic valve. Degenerative changes of the spine on lateral view.   IMPRESSION: Postop chest with prosthetic aortic valve. No acute cardiopulmonary disease     Electronically  Signed   By: Jill Side M.D.   On: 09/10/2022 13:16    Impression:  Overall I think she is doing well 1 month following her surgery.  She has had atrial fibrillation/atrial flutter postoperatively and if she does not convert on her own will undergo DCCV.  She has a follow-up appointment with cardiology on 09/22/2022 and will have a postoperative echo at that time.  I told her that she could return to driving and she said that she drove to her appointment today.  I asked her not to lift anything heavier than 10 pounds for 3 months postoperatively.  Plan:  She will continue to follow-up with cardiology and will return to see me if she has any problems with her incisions.   Gaye Pollack, MD [facility] [phone]

## 2022-09-22 ENCOUNTER — Ambulatory Visit: Payer: Medicare HMO | Admitting: Nurse Practitioner

## 2022-09-22 ENCOUNTER — Encounter: Payer: Self-pay | Admitting: Nurse Practitioner

## 2022-09-22 ENCOUNTER — Ambulatory Visit (HOSPITAL_COMMUNITY): Payer: Medicare HMO | Attending: Cardiology

## 2022-09-22 VITALS — BP 114/72 | HR 126 | Ht 65.0 in | Wt 242.6 lb

## 2022-09-22 DIAGNOSIS — E785 Hyperlipidemia, unspecified: Secondary | ICD-10-CM | POA: Diagnosis not present

## 2022-09-22 DIAGNOSIS — E118 Type 2 diabetes mellitus with unspecified complications: Secondary | ICD-10-CM

## 2022-09-22 DIAGNOSIS — Z952 Presence of prosthetic heart valve: Secondary | ICD-10-CM

## 2022-09-22 DIAGNOSIS — I4819 Other persistent atrial fibrillation: Secondary | ICD-10-CM | POA: Insufficient documentation

## 2022-09-22 DIAGNOSIS — I4892 Unspecified atrial flutter: Secondary | ICD-10-CM

## 2022-09-22 DIAGNOSIS — I350 Nonrheumatic aortic (valve) stenosis: Secondary | ICD-10-CM

## 2022-09-22 DIAGNOSIS — I10 Essential (primary) hypertension: Secondary | ICD-10-CM

## 2022-09-22 LAB — ECHOCARDIOGRAM COMPLETE
AR max vel: 1.7 cm2
AV Area VTI: 1.77 cm2
AV Area mean vel: 1.76 cm2
AV Mean grad: 8.5 mmHg
AV Peak grad: 16.3 mmHg
Ao pk vel: 2.02 m/s
Area-P 1/2: 3.79 cm2
S' Lateral: 2.8 cm

## 2022-09-22 NOTE — H&P (View-Only) (Signed)
Office Visit    Patient Name: Nancy Oliver Date of Encounter: 09/22/2022  Primary Care Provider:  Milus Height, PA Primary Cardiologist:  Rollene Rotunda, MD  Chief Complaint    62 year old female with a history of bicuspid aortic valve, severe aortic stenosis s/p AVR in 08/2022, postop atrial fibrillation, palpitations, hypertension, hyperlipidemia, and type 2 diabetes who presents for follow-up related to AS s/p AVR.   Past Medical History    Past Medical History:  Diagnosis Date   Anxiety    Arthritis    Depression    Diabetes mellitus without complication    GERD (gastroesophageal reflux disease)    Heart murmur    Hyperlipidemia    Hypertension    Migraines    Neuropathy    Obesity    Right sided sciatica    Past Surgical History:  Procedure Laterality Date   AORTIC VALVE REPLACEMENT N/A 08/11/2022   Procedure: AORTIC VALVE REPLACEMENT (AVR) USING INSPIRIS SIZE ;  Surgeon: Alleen Borne, MD;  Location: [facility];  Service: Open Heart Surgery;  Laterality: N/A;   BREAST SURGERY Left 04/2014   biopsy    CESAREAN SECTION     HYSTEROSCOPY WITH D & C N/A 08/20/2021   Procedure: DILATATION AND CURETTAGE /HYSTEROSCOPY;  Surgeon: Myna Hidalgo, DO;  Location: [facility] ORS;  Service: Gynecology;  Laterality: N/A;   KNEE SURGERY     LEFT HEART CATH AND CORONARY ANGIOGRAPHY N/A 07/09/2022   Procedure: LEFT HEART CATH AND CORONARY ANGIOGRAPHY;  Surgeon: Orbie Pyo, MD;  Location: [facility] INVASIVE CV LAB;  Service: Cardiovascular;  Laterality: N/A;   NASAL SEPTOPLASTY W/ TURBINOPLASTY     POLYPECTOMY N/A 08/20/2021   Procedure: POLYPECTOMY(WITH MYOSURE DEVICE);  Surgeon: Myna Hidalgo, DO;  Location: [facility]S;  Service: Gynecology;  Laterality: N/A;   RIGHT HEART CATH N/A 07/09/2022   Procedure: RIGHT HEART CATH;  Surgeon: Orbie Pyo, MD;  [facility] IN[facility] CV LAB;  Service: Cardiovascular;  Laterality: N/A;   TEE WITHOUT CARDIOVERSION N/A 08/11/2022   Procedure:  TRANSESOPHAGEAL ECHOCARDIOGRAM;  Surgeon: Alleen Borne, MD;  Location: [facility] OR;  Service: Open Heart Surgery;  Laterality: N/A;    Allergies  No Known Allergies   Labs/Other Studies Reviewed    The following studies were reviewed today: Echo 04/2022: IMPRESSIONS     1. Bicuspid aortic valve with severe AS (mean gradient 38 mmHg; peak  velocity of 4.1 m/s); LVOT gradient of 3.2 m/s with valsalva.   2. Left ventricular ejection fraction, by estimation, is 60 to 65%. The  left ventricle has normal function. The left ventricle has no regional  wall motion abnormalities. There is mild left ventricular hypertrophy.  Left ventricular diastolic parameters  are consistent with Grade I diastolic dysfunction (impaired relaxation).  The average left ventricular global longitudinal strain is -20.4 %. The  global longitudinal strain is normal.   3. Right ventricular systolic function is normal. The right ventricular  size is normal.   4. The mitral valve is normal in structure. No evidence of mitral valve  regurgitation. No evidence of mitral stenosis. Moderate mitral annular  calcification.   5. The aortic valve is bicuspid. Aortic valve regurgitation is trivial.  Severe aortic valve stenosis.   6. The inferior vena cava is normal in size with greater than 50%  respiratory variability, suggesting right atrial pressure of 3 mmHg.   Comparison(s): 10/08/21 EF 60-65%. Moderate-severe AS mean PG,  peak PG.    R/LHC 06/2022:  1.  Normal left dominant circulation. 2.  Fick cardiac output of 7.2 L/min, Fick cardiac index of 3.3 L/min/m with mean RA pressure of 5 mmHg, RV pressure of 30/1 with an RV end-diastolic pressure of 8 mmHg, mean wedge pressure of 5 mmHg, and mean PA pressure of 16 mmHg.   Recommendation: Cardiothoracic surgical evaluation.      Recent Labs: 08/07/2022: ALT 16 08/12/2022: Magnesium 2.1 08/15/2022: BUN 9; Creatinine, Ser 0.79; Hemoglobin 9.7; Platelets 208;  Potassium 4.0; Sodium 139  Recent Lipid Panel    Component Value Date/Time   CHOL 189 06/17/2013 0946   TRIG 82 06/17/2013 0946   HDL 56 06/17/2013 0946   CHOLHDL 3.4 06/17/2013 0946   VLDL 16 06/17/2013 0946   LDLCALC 117 (H) 06/17/2013 0946    History of Present Illness    62 year old female with the above past medical history including bicuspid aortic valve, severe aortic stenosis s/p AVR in 08/2022, postop atrial fibrillation, palpitations, hypertension, hyperlipidemia, and type 2 diabetes.   Echocardiogram in 2020 showed EF 60 to 65%, mild aortic stenosis, tricuspid valve with mild calcification.  ETT in 08/2020 was normal.  Most recent echocardiogram in 11/23 revealed bicuspid aortic valve with severe AS (mean gradient 38 mmHg), EF 60 to 65%, normal LV function, no RWMA, mild LVH, G1 DD, normal RV function. R/LHC in anticipation of surgery in 06/2022 showed no evidence of CAD.  She was referred to CT surgery and underwent elective aortic valve replacement with 23 mm Inspiris valve on 08/11/2022.  Postop course was complicated by atrial fibrillation.  She was started on amiodarone and converted to NSR.  Anticoagulation was deferred.  She was last seen in the office on 09/01/2022 and was noted to be in atrial flutter, HR 125 bpm.  She did note increased fatigue.  She was started on Eliquis, amiodarone was increased to 200 mg twice daily.  Metoprolol was increased to 37.5 mg twice daily.  She presents today for follow-up.  Since her last visit he has been stable overall from a cardiac standpoint. She does note ongoing fatigue, increased bilateral lower extremity edema. She remains in atrial flutter, 126 bpm.  She denies chest pain, palpitations, dyspnea, PND, orthopnea, weight gain.    Home Medications    Current Outpatient Medications  Medication Sig Dispense Refill   ACCU-CHEK AVIVA PLUS test strip      allopurinol (ZYLOPRIM) 300 MG tablet Take 300 mg by mouth daily.     ALPRAZolam (XANAX)  0.5 MG tablet TAKE ONE TABLET BY MOUTH EVERY 8 HOURS AS NEEDED 60 tablet 0   amiodarone (PACERONE) 200 MG tablet Take 200 mg by mouth 2 (two) times daily.     apixaban (ELIQUIS) 5 MG TABS tablet Take 1 tablet (5 mg total) by mouth 2 (two) times daily. 60 tablet 6   aspirin EC 325 MG tablet Take 1 tablet (325 mg total) by mouth daily.     atorvastatin (LIPITOR) 80 MG tablet Take 80 mg by mouth daily.     Blood Glucose Monitoring Suppl (ACCU-CHEK AVIVA PLUS) w/Device KIT See admin instructions.     DULoxetine (CYMBALTA) 60 MG capsule Take 60 mg by mouth daily as needed (anxiety).     furosemide (LASIX) 40 MG tablet Take 1 tablet (40 mg total) by mouth daily. X 7 days, then you may resume 20 mg daily prn edema 7 tablet 0   gabapentin (NEURONTIN) 300 MG capsule Take 300-600 mg by mouth See admin instructions. Take 300 mg  in the morning, 300 mg in the afternoon, and 600 mg at bedtime     metFORMIN (GLUCOPHAGE) 1000 MG tablet Take 1,000 mg by mouth 2 (two) times daily with a meal.     metoprolol tartrate (LOPRESSOR) 25 MG tablet Take 1.5 tablets (37.5 mg total) by mouth 2 (two) times daily. 180 tablet 3   norethindrone (MICRONOR) 0.35 MG tablet Take 1 tablet by mouth daily.     omeprazole (PRILOSEC) 40 MG capsule Take 40 mg by mouth daily.     oxyCODONE (OXY IR/ROXICODONE) 5 MG immediate release tablet Take 1 tablet (5 mg total) by mouth every 4 (four) hours as needed for severe pain. 30 tablet 0   Polyethyl Glycol-Propyl Glycol (SYSTANE OP) Place 1 drop into both eyes daily.     progesterone (PROMETRIUM) 100 MG capsule Take 1 capsule (100 mg total) by mouth daily. 90 capsule 4   TRESIBA FLEXTOUCH 100 UNIT/ML SOPN FlexTouch Pen Inject 25 Units into the skin daily.     TRULICITY 4.5 MG/0.5ML SOPN Inject 3 mg into the skin every Sunday.     No current facility-administered medications for this visit.     Review of Systems   She denies chest pain, palpitations, dyspnea, pnd, orthopnea, n, v,  dizziness, syncope, weight gain, or early satiety. All other systems reviewed and are otherwise negative except as noted above.   Physical Exam    VS:  BP 114/72 (BP Location: Right Arm, Patient Position: Sitting, Cuff Size: Large)   Pulse (!) 126   Ht 5\' 5"  (1.651 m)   Wt 242 lb 9.6 oz (110 kg)   LMP 10/07/2012   SpO2 96%   BMI 40.37 kg/m   GEN: Well nourished, well developed, in no acute distress. HEENT: normal. Neck: Supple, no JVD, carotid bruits, or masses. Cardiac: IRIR, no murmurs, rubs, or gallops. No clubbing, cyanosis, nonpitting bilateral lower extremity edema.  Radials/DP/PT 2+ and equal bilaterally.  Respiratory:  Respirations regular and unlabored, clear to auscultation bilaterally. GI: Soft, nontender, nondistended, BS + x 4. MS: no deformity or atrophy. Skin: warm and dry, no rash. Neuro:  Strength and sensation are intact. Psych: Normal affect.  Accessory Clinical Findings    ECG personally reviewed by me today -atrial flutter, 126 bpm- no acute changes.   Lab Results  Component Value Date   WBC 12.8 (H) 08/15/2022   HGB 9.7 (L) 08/15/2022   HCT 28.9 (L) 08/15/2022   MCV 92.0 08/15/2022   PLT 208 08/15/2022   Lab Results  Component Value Date   CREATININE 0.79 08/15/2022   BUN 9 08/15/2022   NA 139 08/15/2022   K 4.0 08/15/2022   CL 102 08/15/2022   CO2 27 08/15/2022   Lab Results  Component Value Date   ALT 16 08/07/2022   AST 20 08/07/2022   ALKPHOS 94 08/07/2022   BILITOT 0.5 08/07/2022   Lab Results  Component Value Date   CHOL 189 06/17/2013   HDL 56 06/17/2013   LDLCALC 117 (H) 06/17/2013   TRIG 82 06/17/2013   CHOLHDL 3.4 06/17/2013    Lab Results  Component Value Date   HGBA1C 6.4 (H) 08/07/2022    Assessment & Plan    1. Aortic stenosis: Severe, s/p AVR in on 08/11/2022. Euvolemic and well compensated on exam. Postop echo was completed today, results pending.    2. Persistent atrial fibrillation/atrial flutter: Originally  occurred in the postop setting.  She remains in atrial flutter, 126 bpm.  She  has been adherent to her Eliquis (CHA2DS2VASc = 3).  She notes generalized fatigue, increased lower extremity edema, she denies palpitations, dizziness, chest pain, dyspnea.  Through shared decision making, will pursue DCCV.  Will update CBC, BMET. Will increase Lasix to 40 mg daily.  Will repeat CMET in 1 to 2 weeks.  Continue amiodarone, metoprolol, Eliquis. DCCV scheduled for 10/01/2022 at 8:30 a.m. with Dr. Duke Salvia.   Shared Decision Making/Informed Consent The risks (stroke, cardiac arrhythmias rarely resulting in the need for a temporary or permanent pacemaker, skin irritation or burns and complications associated with conscious sedation including aspiration, arrhythmia, respiratory failure and death), benefits (restoration of normal sinus rhythm) and alternatives of a direct current cardioversion were explained in detail to Ms. Oldaker and she agrees to proceed.   3. Hypertension: BP well controlled. Continue current antihypertensive regimen.    4. Hyperlipidemia: LDL was 122 in 04/2022.  Will repeat fasting lipid panel, CMET in 1 to 2 weeks.  Continue Lipitor.   5. Type 2 diabetes: A1c was 6.4 in 07/2022. Monitored and managed per PCP.    6. Disposition: Follow-up 3 weeks post DCCV.       Joylene Grapes, NP 09/22/2022, 5:21 PM

## 2022-09-22 NOTE — Patient Instructions (Addendum)
Medication Instructions:  Lasix 40 mg daily   *If you need a refill on your cardiac medications before your next appointment, please call your pharmacy*   Lab Work: Your physician recommends that you return for lab work 1 week before procedure. Fasting Lipid panel, CBC & CMET    Testing/Procedures: Your physician has requested that you have a Cardioversion. During a TEE, sound waves are used to create images of your heart. It provides your doctor with information about the size and shape of your heart and how well your heart's chambers and valves are working. In this test, a transducer is attached to the end of a flexible tube that is guided down you throat and into your esophagus (the tube leading from your mouth to your stomach) to get a more detailed image of your heart. Once the TEE has determined that a blood clot is not present, the cardioversion begins. Electrical Cardioversion uses a jolt of electricity to your heart either through paddles or wired patches attached to your chest. This is a controlled, usually prescheduled, procedure. This procedure is done at the hospital and you are not awake during the procedure. You usually go home the day of the procedure. Please see the instruction sheet given to you today for more information.    Follow-Up: At [facility], you and your health needs are our priority.  As part of our continuing mission to provide you with exceptional heart care, we have created designated Provider Care Teams.  These Care Teams include your primary Cardiologist (physician) and Advanced Practice Providers (APPs -  Physician Assistants and Nurse Practitioners) who all work together to provide you with the care you need, when you need it.  We recommend signing up for the patient portal called "MyChart".  Sign up information is provided on this After Visit Summary.  MyChart is used to connect with patients for Virtual Visits (Telemedicine).  Patients are able to  view lab/test results, encounter notes, upcoming appointments, etc.  Non-urgent messages can be sent to your provider as well.   To learn more about what you can do with MyChart, go to [email or web address].    Your next appointment:   4 week(s)  Provider:   Bernadene Person, NP        Other Instructions     Dear Nancy Oliver  You are scheduled for a Cardioversion on [facility], April 24 with Dr. Duke Salvia.  Please arrive at the [facility] (Main Entrance A) at [facility]: [address] [place], [place] [address] at 7:30 AM.   DIET:  Nothing to eat or drink after midnight except a sip of water with medications (see medication instructions below)  MEDICATION INSTRUCTIONS: !!IF ANY NEW MEDICATIONS ARE STARTED AFTER TODAY, PLEASE NOTIFY YOUR PROVIDER AS SOON AS POSSIBLE!!  FYI: Medications such as Semaglutide (Ozempic, [place]), Tirzepatide (Mounjaro, Zepbound), Dulaglutide (Trulicity), etc ("GLP1 agonists") must be held around the time of a procedure. Talk to your provider if you take one of these.  HOLD: Dulaglutide (Trulicity) for 1 week prior to procedure.  Continue taking your anticoagulant (blood thinner): Apixaban (Eliquis).  You will need to continue this after your procedure until you are told by your provider that it is safe to stop.    LABS:   Come to the lab at [facility] at [address] between the hours of 8:00 am and 4:30 pm. You do NOT have to be fasting.  FYI:  For your safety, and to  allow [place] to monitor your vital signs accurately during the surgery/procedure we request: If you have artificial nails, gel coating, SNS etc, please have those removed prior to your surgery/procedure. Not having the nail coverings /polish removed may result in cancellation or delay of your surgery/procedure.  You must have a responsible person to drive you home and stay in the waiting area during your procedure. Failure to do so could result in  cancellation.  Bring your insurance cards.  *Special Note: Every effort is made to have your procedure done on time. Occasionally there are emergencies that occur at the hospital that may cause delays. Please be patient if a delay does occur.

## 2022-09-22 NOTE — Progress Notes (Signed)
 Office Visit    Patient Name: Nancy Oliver Date of Encounter: 09/22/2022  Primary Care Provider:  Redmon, Noelle, PA Primary Cardiologist:  James Hochrein, MD  Chief Complaint    61-year-old female with a history of bicuspid aortic valve, severe aortic stenosis s/p AVR in 08/2022, postop atrial fibrillation, palpitations, hypertension, hyperlipidemia, and type 2 diabetes who presents for follow-up related to AS s/p AVR.   Past Medical History    Past Medical History:  Diagnosis Date   Anxiety    Arthritis    Depression    Diabetes mellitus without complication    GERD (gastroesophageal reflux disease)    Heart murmur    Hyperlipidemia    Hypertension    Migraines    Neuropathy    Obesity    Right sided sciatica    Past Surgical History:  Procedure Laterality Date   AORTIC VALVE REPLACEMENT N/A 08/11/2022   Procedure: AORTIC VALVE REPLACEMENT (AVR) USING INSPIRIS SIZE 23MM;  Surgeon: Bartle, Bryan K, MD;  Location: [facility];  Service: Open Heart Surgery;  Laterality: N/A;   BREAST SURGERY Left 04/2014   biopsy    CESAREAN SECTION     HYSTEROSCOPY WITH D & C N/A 08/20/2021   Procedure: DILATATION AND CURETTAGE /HYSTEROSCOPY;  Surgeon: Ozan, Jennifer, DO;  Location: [facility];  Service: Gynecology;  Laterality: N/A;   KNEE SURGERY     LEFT HEART CATH AND CORONARY ANGIOGRAPHY N/A 07/09/2022   Procedure: LEFT HEART CATH AND CORONARY ANGIOGRAPHY;  Surgeon: Thukkani, Arun K, MD;  Location: [facility];  Service: Cardiovascular;  Laterality: N/A;   NASAL SEPTOPLASTY W/ TURBINOPLASTY     POLYPECTOMY N/A 08/20/2021   Procedure: POLYPECTOMY(WITH MYOSURE DEVICE);  Surgeon: Ozan, Jennifer, DO;  Location: [facility];  Service: Gynecology;  Laterality: N/A;   RIGHT HEART CATH N/A 07/09/2022   Procedure: RIGHT HEART CATH;  Surgeon: Thukkani, Arun K, MD;  Location: [facility];  Service: Cardiovascular;  Laterality: N/A;   TEE WITHOUT CARDIOVERSION N/A 08/11/2022   Procedure:  TRANSESOPHAGEAL ECHOCARDIOGRAM;  Surgeon: Bartle, Bryan K, MD;  Location: [facility];  Service: Open Heart Surgery;  Laterality: N/A;    Allergies  No Known Allergies   Labs/Other Studies Reviewed    The following studies were reviewed today: Echo 04/2022: IMPRESSIONS     1. Bicuspid aortic valve with severe AS (mean gradient 38 mmHg; peak  velocity of 4.1 m/s); LVOT gradient of 3.2 m/s with valsalva.   2. Left ventricular ejection fraction, by estimation, is 60 to 65%. The  left ventricle has normal function. The left ventricle has no regional  wall motion abnormalities. There is mild left ventricular hypertrophy.  Left ventricular diastolic parameters  are consistent with Grade I diastolic dysfunction (impaired relaxation).  The average left ventricular global longitudinal strain is -20.4 %. The  global longitudinal strain is normal.   3. Right ventricular systolic function is normal. The right ventricular  size is normal.   4. The mitral valve is normal in structure. No evidence of mitral valve  regurgitation. No evidence of mitral stenosis. Moderate mitral annular  calcification.   5. The aortic valve is bicuspid. Aortic valve regurgitation is trivial.  Severe aortic valve stenosis.   6. The inferior vena cava is normal in size with greater than 50%  respiratory variability, suggesting right atrial pressure of 3 mmHg.   Comparison(s): 10/08/21 EF 60-65%. Moderate-severe AS 34mmHg mean PG, 47mmHg  peak PG.    R/LHC 06/2022:   1.  Normal left dominant circulation. 2.  Fick cardiac output of 7.2 L/min, Fick cardiac index of 3.3 L/min/m with mean RA pressure of 5 mmHg, RV pressure of 30/1 with an RV end-diastolic pressure of 8 mmHg, mean wedge pressure of 5 mmHg, and mean PA pressure of 16 mmHg.   Recommendation: Cardiothoracic surgical evaluation.      Recent Labs: 08/07/2022: ALT 16 08/12/2022: Magnesium 2.1 08/15/2022: BUN 9; Creatinine, Ser 0.79; Hemoglobin 9.7; Platelets 208;  Potassium 4.0; Sodium 139  Recent Lipid Panel    Component Value Date/Time   CHOL 189 06/17/2013 0946   TRIG 82 06/17/2013 0946   HDL 56 06/17/2013 0946   CHOLHDL 3.4 06/17/2013 0946   VLDL 16 06/17/2013 0946   LDLCALC 117 (H) 06/17/2013 0946    History of Present Illness    61-year-old female with the above past medical history including bicuspid aortic valve, severe aortic stenosis s/p AVR in 08/2022, postop atrial fibrillation, palpitations, hypertension, hyperlipidemia, and type 2 diabetes.   Echocardiogram in 2020 showed EF 60 to 65%, mild aortic stenosis, tricuspid valve with mild calcification.  ETT in 08/2020 was normal.  Most recent echocardiogram in 11/23 revealed bicuspid aortic valve with severe AS (mean gradient 38 mmHg), EF 60 to 65%, normal LV function, no RWMA, mild LVH, G1 DD, normal RV function. R/LHC in anticipation of surgery in 06/2022 showed no evidence of CAD.  She was referred to CT surgery and underwent elective aortic valve replacement with 23 mm Inspiris valve on 08/11/2022.  Postop course was complicated by atrial fibrillation.  She was started on amiodarone and converted to NSR.  Anticoagulation was deferred.  She was last seen in the office on 09/01/2022 and was noted to be in atrial flutter, HR 125 bpm.  She did note increased fatigue.  She was started on Eliquis, amiodarone was increased to 200 mg twice daily.  Metoprolol was increased to 37.5 mg twice daily.  She presents today for follow-up.  Since her last visit he has been stable overall from a cardiac standpoint. She does note ongoing fatigue, increased bilateral lower extremity edema. She remains in atrial flutter, 126 bpm.  She denies chest pain, palpitations, dyspnea, PND, orthopnea, weight gain.    Home Medications    Current Outpatient Medications  Medication Sig Dispense Refill   ACCU-CHEK AVIVA PLUS test strip      allopurinol (ZYLOPRIM) 300 MG tablet Take 300 mg by mouth daily.     ALPRAZolam (XANAX)  0.5 MG tablet TAKE ONE TABLET BY MOUTH EVERY 8 HOURS AS NEEDED 60 tablet 0   amiodarone (PACERONE) 200 MG tablet Take 200 mg by mouth 2 (two) times daily.     apixaban (ELIQUIS) 5 MG TABS tablet Take 1 tablet (5 mg total) by mouth 2 (two) times daily. 60 tablet 6   aspirin EC 325 MG tablet Take 1 tablet (325 mg total) by mouth daily.     atorvastatin (LIPITOR) 80 MG tablet Take 80 mg by mouth daily.     Blood Glucose Monitoring Suppl (ACCU-CHEK AVIVA PLUS) w/Device KIT See admin instructions.     DULoxetine (CYMBALTA) 60 MG capsule Take 60 mg by mouth daily as needed (anxiety).     furosemide (LASIX) 40 MG tablet Take 1 tablet (40 mg total) by mouth daily. X 7 days, then you may resume 20 mg daily prn edema 7 tablet 0   gabapentin (NEURONTIN) 300 MG capsule Take 300-600 mg by mouth See admin instructions. Take 300 mg   in the morning, 300 mg in the afternoon, and 600 mg at bedtime     metFORMIN (GLUCOPHAGE) 1000 MG tablet Take 1,000 mg by mouth 2 (two) times daily with a meal.     metoprolol tartrate (LOPRESSOR) 25 MG tablet Take 1.5 tablets (37.5 mg total) by mouth 2 (two) times daily. 180 tablet 3   norethindrone (MICRONOR) 0.35 MG tablet Take 1 tablet by mouth daily.     omeprazole (PRILOSEC) 40 MG capsule Take 40 mg by mouth daily.     oxyCODONE (OXY IR/ROXICODONE) 5 MG immediate release tablet Take 1 tablet (5 mg total) by mouth every 4 (four) hours as needed for severe pain. 30 tablet 0   Polyethyl Glycol-Propyl Glycol (SYSTANE OP) Place 1 drop into both eyes daily.     progesterone (PROMETRIUM) 100 MG capsule Take 1 capsule (100 mg total) by mouth daily. 90 capsule 4   TRESIBA FLEXTOUCH 100 UNIT/ML SOPN FlexTouch Pen Inject 25 Units into the skin daily.     TRULICITY 4.5 MG/0.5ML SOPN Inject 3 mg into the skin every Sunday.     No current facility-administered medications for this visit.     Review of Systems   She denies chest pain, palpitations, dyspnea, pnd, orthopnea, n, v,  dizziness, syncope, weight gain, or early satiety. All other systems reviewed and are otherwise negative except as noted above.   Physical Exam    VS:  BP 114/72 (BP Location: Right Arm, Patient Position: Sitting, Cuff Size: Large)   Pulse (!) 126   Ht 5' 5" (1.651 m)   Wt 242 lb 9.6 oz (110 kg)   LMP 10/07/2012   SpO2 96%   BMI 40.37 kg/m   GEN: Well nourished, well developed, in no acute distress. HEENT: normal. Neck: Supple, no JVD, carotid bruits, or masses. Cardiac: IRIR, no murmurs, rubs, or gallops. No clubbing, cyanosis, nonpitting bilateral lower extremity edema.  Radials/DP/PT 2+ and equal bilaterally.  Respiratory:  Respirations regular and unlabored, clear to auscultation bilaterally. GI: Soft, nontender, nondistended, BS + x 4. MS: no deformity or atrophy. Skin: warm and dry, no rash. Neuro:  Strength and sensation are intact. Psych: Normal affect.  Accessory Clinical Findings    ECG personally reviewed by me today -atrial flutter, 126 bpm- no acute changes.   Lab Results  Component Value Date   WBC 12.8 (H) 08/15/2022   HGB 9.7 (L) 08/15/2022   HCT 28.9 (L) 08/15/2022   MCV 92.0 08/15/2022   PLT 208 08/15/2022   Lab Results  Component Value Date   CREATININE 0.79 08/15/2022   BUN 9 08/15/2022   NA 139 08/15/2022   K 4.0 08/15/2022   CL 102 08/15/2022   CO2 27 08/15/2022   Lab Results  Component Value Date   ALT 16 08/07/2022   AST 20 08/07/2022   ALKPHOS 94 08/07/2022   BILITOT 0.5 08/07/2022   Lab Results  Component Value Date   CHOL 189 06/17/2013   HDL 56 06/17/2013   LDLCALC 117 (H) 06/17/2013   TRIG 82 06/17/2013   CHOLHDL 3.4 06/17/2013    Lab Results  Component Value Date   HGBA1C 6.4 (H) 08/07/2022    Assessment & Plan    1. Aortic stenosis: Severe, s/p AVR in on 08/11/2022. Euvolemic and well compensated on exam. Postop echo was completed today, results pending.    2. Persistent atrial fibrillation/atrial flutter: Originally  occurred in the postop setting.  She remains in atrial flutter, 126 bpm.  She   has been adherent to her Eliquis (CHA2DS2VASc = 3).  She notes generalized fatigue, increased lower extremity edema, she denies palpitations, dizziness, chest pain, dyspnea.  Through shared decision making, will pursue DCCV.  Will update CBC, BMET. Will increase Lasix to 40 mg daily.  Will repeat CMET in 1 to 2 weeks.  Continue amiodarone, metoprolol, Eliquis. DCCV scheduled for 10/01/2022 at 8:30 a.m. with Dr. Ruthton.   Shared Decision Making/Informed Consent The risks (stroke, cardiac arrhythmias rarely resulting in the need for a temporary or permanent pacemaker, skin irritation or burns and complications associated with conscious sedation including aspiration, arrhythmia, respiratory failure and death), benefits (restoration of normal sinus rhythm) and alternatives of a direct current cardioversion were explained in detail to Ms. Takacs and she agrees to proceed.   3. Hypertension: BP well controlled. Continue current antihypertensive regimen.    4. Hyperlipidemia: LDL was 122 in 04/2022.  Will repeat fasting lipid panel, CMET in 1 to 2 weeks.  Continue Lipitor.   5. Type 2 diabetes: A1c was 6.4 in 07/2022. Monitored and managed per PCP.    6. Disposition: Follow-up 3 weeks post DCCV.       Keniyah Gelinas C Benelli Winther, NP 09/22/2022, 5:21 PM   

## 2022-09-23 ENCOUNTER — Telehealth: Payer: Self-pay | Admitting: Cardiology

## 2022-09-23 DIAGNOSIS — I4819 Other persistent atrial fibrillation: Secondary | ICD-10-CM

## 2022-09-23 NOTE — Telephone Encounter (Signed)
Patient states there is a medication that starts with an "A," but she does not remember the name of it--she states she is at [facility] right now and does not have access to the bottle. She states insurance will not cover it and she is completely out of medication. She is requesting a call back to discuss.

## 2022-09-23 NOTE — Telephone Encounter (Signed)
Spoke with patient and her phone kept going in and out. She stated she will be home at in 15 minutes I informed her I will call her back by [phone].   Spoke with patient and she states she is completely out of amidarone  tablets because on 3/25 you increases her dose to  BID. She states her visit with you yesterday she is to continue on  BID. She went to refill her medication but insurance will not cover until 4/23. Can we send in new Rx for  BID so she will not run out being that her dose increased.

## 2022-09-23 NOTE — Telephone Encounter (Signed)
*  STAT* If patient is at the pharmacy, call can be transferred to refill team.   1. Which medications need to be refilled? (please list name of each medication and dose if known) amiodarone (PACERONE) 200 MG tablet   2. Which pharmacy/location (including street and city if local pharmacy) is medication to be sent to?  [facility] [address], [place] - [address]    3. Do they need a 30 day or 90 day supply? 90

## 2022-09-24 ENCOUNTER — Other Ambulatory Visit: Payer: Self-pay

## 2022-09-24 DIAGNOSIS — I4819 Other persistent atrial fibrillation: Secondary | ICD-10-CM

## 2022-09-24 DIAGNOSIS — E785 Hyperlipidemia, unspecified: Secondary | ICD-10-CM

## 2022-09-24 MED ORDER — AMIODARONE HCL 200 MG PO TABS
200.0000 mg | ORAL_TABLET | Freq: Two times a day (BID) | ORAL | 11 refills | Status: DC
Start: 2022-09-24 — End: 2023-02-18

## 2022-09-24 NOTE — Addendum Note (Signed)
Addended by: Serita Sheller R on: 09/24/2022 01:13 PM   Modules accepted: Orders

## 2022-09-24 NOTE — Telephone Encounter (Signed)
Patient aware new Rx sent to pharmacy

## 2022-09-24 NOTE — Telephone Encounter (Signed)
Medication has been refilled by somebody else.

## 2022-09-24 NOTE — Addendum Note (Signed)
Addended by: Bernita Buffy on: 09/24/2022 12:22 PM   Modules accepted: Orders

## 2022-09-25 LAB — CBC
Hematocrit: 43.5 % (ref 34.0–46.6)
Hemoglobin: 13.9 g/dL (ref 11.1–15.9)
MCH: 29.6 pg (ref 26.6–33.0)
MCHC: 32 g/dL (ref 31.5–35.7)
MCV: 93 fL (ref 79–97)
Platelets: 252 10*3/uL (ref 150–450)
RBC: 4.69 x10E6/uL (ref 3.77–5.28)
RDW: 13 % (ref 11.7–15.4)
WBC: 9.7 10*3/uL (ref 3.4–10.8)

## 2022-09-25 LAB — COMPREHENSIVE METABOLIC PANEL
ALT: 9 IU/L (ref 0–32)
AST: 12 IU/L (ref 0–40)
Albumin/Globulin Ratio: 1.6 (ref 1.2–2.2)
Albumin: 4.2 g/dL (ref 3.8–4.9)
Alkaline Phosphatase: 121 IU/L (ref 44–121)
BUN/Creatinine Ratio: 19 (ref 12–28)
BUN: 17 mg/dL (ref 8–27)
Bilirubin Total: 0.7 mg/dL (ref 0.0–1.2)
CO2: 25 mmol/L (ref 20–29)
Calcium: 9.6 mg/dL (ref 8.7–10.3)
Chloride: 102 mmol/L (ref 96–106)
Creatinine, Ser: 0.9 mg/dL (ref 0.57–1.00)
Globulin, Total: 2.7 g/dL (ref 1.5–4.5)
Glucose: 115 mg/dL — ABNORMAL HIGH (ref 70–99)
Potassium: 4.9 mmol/L (ref 3.5–5.2)
Sodium: 141 mmol/L (ref 134–144)
Total Protein: 6.9 g/dL (ref 6.0–8.5)
eGFR: 73 mL/min/{1.73_m2} (ref >59)

## 2022-09-25 LAB — LIPID PANEL
Chol/HDL Ratio: 2.6 ratio (ref 0.0–4.4)
Cholesterol, Total: 169 mg/dL (ref 100–199)
HDL: 64 mg/dL (ref >39)
LDL Chol Calc (NIH): 89 mg/dL (ref 0–99)
Triglycerides: 84 mg/dL (ref 0–149)
VLDL Cholesterol Cal: 16 mg/dL (ref 5–40)

## 2022-09-29 ENCOUNTER — Encounter: Payer: Self-pay | Admitting: *Deleted

## 2022-09-30 ENCOUNTER — Telehealth: Payer: Self-pay

## 2022-09-30 NOTE — Pre-Procedure Instructions (Signed)
Left voicemail in regards to cardioversion tomorrow - arrival time, NPO status, ride home, and anticoagulant

## 2022-09-30 NOTE — Telephone Encounter (Signed)
Spoke with pt. Pt was notified of lab results. Pt will continue her current medication and f/u as planned.  

## 2022-09-30 NOTE — Anesthesia Preprocedure Evaluation (Signed)
Anesthesia Evaluation  Patient identified by MRN, date of birth, ID band Patient awake    Reviewed: Allergy & Precautions, NPO status , Patient's Chart, lab work & pertinent test results, reviewed documented beta blocker date and time   History of Anesthesia Complications Negative for: history of anesthetic complications  Airway Mallampati: II  TM Distance: >3 FB Neck ROM: Full    Dental  (+) Dental Advisory Given   Pulmonary neg pulmonary ROS   breath sounds clear to auscultation       Cardiovascular hypertension, Pt. on medications and Pt. on home beta blockers (-) angina + dysrhythmias Atrial Fibrillation + Valvular Problems/Murmurs (s/p AVR)  Rhythm:Irregular Rate:Tachycardia  09/22/2022 ECHO: EF 60-65%, normal LVF, normal RVF, L pleural effusion, s/p bioprosthetic AVR functioning normally   Neuro/Psych  Headaches  Anxiety Depression       GI/Hepatic Neg liver ROS,GERD  Medicated and Controlled,,  Endo/Other  diabetes (glu 143), Oral Hypoglycemic Agents  Trulicity  Renal/GU negative Renal ROS     Musculoskeletal   Abdominal  (+) + obese  Peds  Hematology eliquis   Anesthesia Other Findings   Reproductive/Obstetrics                              Anesthesia Physical Anesthesia Plan  ASA: 3  Anesthesia Plan: General   Post-op Pain Management: Minimal or no pain anticipated   Induction: Intravenous  PONV Risk Score and Plan: 3 and Treatment may vary due to age or medical condition  Airway Management Planned: Natural Airway and Mask  Additional Equipment: None  Intra-op Plan:   Post-operative Plan:   Informed Consent: I have reviewed the patients History and Physical, chart, labs and discussed the procedure including the risks, benefits and alternatives for the proposed anesthesia with the patient or authorized representative who has indicated his/her understanding and acceptance.      Dental advisory given  Plan Discussed with: CRNA and Surgeon  Anesthesia Plan Comments:         Anesthesia Quick Evaluation

## 2022-10-01 ENCOUNTER — Encounter (HOSPITAL_COMMUNITY): Payer: Self-pay | Admitting: Cardiovascular Disease

## 2022-10-01 ENCOUNTER — Ambulatory Visit (HOSPITAL_COMMUNITY): Payer: Medicare HMO | Admitting: Anesthesiology

## 2022-10-01 ENCOUNTER — Ambulatory Visit (HOSPITAL_BASED_OUTPATIENT_CLINIC_OR_DEPARTMENT_OTHER): Payer: Medicare HMO | Admitting: Anesthesiology

## 2022-10-01 ENCOUNTER — Ambulatory Visit (HOSPITAL_COMMUNITY)
Admission: RE | Admit: 2022-10-01 | Discharge: 2022-10-01 | Disposition: A | Discharge status: Home / Self Care | Payer: Medicare HMO | Attending: Cardiovascular Disease | Admitting: Cardiovascular Disease

## 2022-10-01 ENCOUNTER — Encounter (HOSPITAL_COMMUNITY)
Admission: RE | Disposition: A | Discharge status: Home / Self Care | Payer: Self-pay | Source: Home / Self Care | Attending: Cardiovascular Disease

## 2022-10-01 ENCOUNTER — Other Ambulatory Visit: Payer: Self-pay

## 2022-10-01 DIAGNOSIS — I4819 Other persistent atrial fibrillation: Secondary | ICD-10-CM | POA: Insufficient documentation

## 2022-10-01 DIAGNOSIS — Z7901 Long term (current) use of anticoagulants: Secondary | ICD-10-CM | POA: Insufficient documentation

## 2022-10-01 DIAGNOSIS — Z952 Presence of prosthetic heart valve: Secondary | ICD-10-CM | POA: Diagnosis not present

## 2022-10-01 DIAGNOSIS — Z7984 Long term (current) use of oral hypoglycemic drugs: Secondary | ICD-10-CM | POA: Insufficient documentation

## 2022-10-01 DIAGNOSIS — Z79899 Other long term (current) drug therapy: Secondary | ICD-10-CM | POA: Diagnosis not present

## 2022-10-01 DIAGNOSIS — E119 Type 2 diabetes mellitus without complications: Secondary | ICD-10-CM | POA: Insufficient documentation

## 2022-10-01 DIAGNOSIS — I4892 Unspecified atrial flutter: Secondary | ICD-10-CM

## 2022-10-01 DIAGNOSIS — I10 Essential (primary) hypertension: Secondary | ICD-10-CM

## 2022-10-01 DIAGNOSIS — I350 Nonrheumatic aortic (valve) stenosis: Secondary | ICD-10-CM | POA: Diagnosis not present

## 2022-10-01 DIAGNOSIS — E785 Hyperlipidemia, unspecified: Secondary | ICD-10-CM | POA: Insufficient documentation

## 2022-10-01 HISTORY — PX: CARDIOVERSION: SHX1299

## 2022-10-01 LAB — GLUCOSE, CAPILLARY: Glucose-Capillary: 143 mg/dL — ABNORMAL HIGH (ref 70–99)

## 2022-10-01 SURGERY — CARDIOVERSION
Anesthesia: General

## 2022-10-01 MED ORDER — SODIUM CHLORIDE 0.9 % IV SOLN: INTRAVENOUS | Status: DC

## 2022-10-01 MED ORDER — LIDOCAINE 2% (20 MG/ML) 5 ML SYRINGE
INTRAMUSCULAR | Status: DC | PRN
  Administered 2022-10-01: 40 mg via INTRAVENOUS

## 2022-10-01 MED ORDER — PROPOFOL 10 MG/ML IV BOLUS
INTRAVENOUS | Status: DC | PRN
  Administered 2022-10-01: 80 mg via INTRAVENOUS

## 2022-10-01 SURGICAL SUPPLY — 1 items: ELECT DEFIB PAD ADLT CADENCE (PAD) ×2 IMPLANT

## 2022-10-01 NOTE — Transfer of Care (Signed)
Immediate Anesthesia Transfer of Care Note  Patient: Nancy Oliver  Procedure(s) Performed: CARDIOVERSION  Patient Location: PACU and Cath Lab  Anesthesia Type:General  Level of Consciousness: awake, oriented, and patient cooperative  Airway & Oxygen Therapy: Patient Spontanous Breathing  Post-op Assessment: Report given to RN and Post -op Vital signs reviewed and stable  Post vital signs: Reviewed and stable  Last Vitals:  Vitals Value Taken Time  BP 114/79 10/01/22 0839  Temp    Pulse 112 10/01/22 0841  Resp 20 10/01/22 0841  SpO2 100 % 10/01/22 0841    Last Pain:  Vitals:   10/01/22 0739  TempSrc: Temporal  PainSc: 0-No pain         Complications: No notable events documented.

## 2022-10-01 NOTE — CV Procedure (Signed)
Electrical Cardioversion Procedure Note ADJOA ALTHOUSE [id number] 04/13/1961  Procedure: Electrical Cardioversion Indications:  Atrial Flutter  Procedure Details Consent: Risks of procedure as well as the alternatives and risks of each were explained to the (patient/caregiver).  Consent for procedure obtained. Time Out: Verified patient identification, verified procedure, site/side was marked, verified correct patient position, special equipment/implants available, medications/allergies/relevent history reviewed, required imaging and test results available.  Performed  Patient placed on cardiac monitor, pulse oximetry, supplemental oxygen as necessary.  Sedation given:  propofol Pacer pads placed anterior and posterior chest.  Cardioverted 1 time(s).  Cardioverted at 150J.  Evaluation Findings: Post procedure EKG shows: NSR Complications: None Patient did tolerate procedure well.   Chilton Si, MD 10/01/2022, 8:40 AM

## 2022-10-01 NOTE — Interval H&P Note (Signed)
History and Physical Interval Note:  10/01/2022 8:28 AM  Nancy Oliver  has presented today for surgery, with the diagnosis of AFIB AND AFLUTTER.  The various methods of treatment have been discussed with the patient and family. After consideration of risks, benefits and other options for treatment, the patient has consented to  Procedure(s): CARDIOVERSION (N/A) as a surgical intervention.  The patient's history has been reviewed, patient examined, no change in status, stable for surgery.  I have reviewed the patient's chart and labs.  Questions were answered to the patient's satisfaction.     Chilton Si, MD

## 2022-10-01 NOTE — Discharge Instructions (Signed)

## 2022-10-01 NOTE — Anesthesia Postprocedure Evaluation (Signed)
Anesthesia Post Note  Patient: Nancy Oliver  Procedure(s) Performed: CARDIOVERSION     Patient location during evaluation: Cath Lab Anesthesia Type: General Level of consciousness: awake and alert, patient cooperative and oriented Pain management: pain level controlled Vital Signs Assessment: post-procedure vital signs reviewed and stable Respiratory status: spontaneous breathing, nonlabored ventilation and respiratory function stable Cardiovascular status: blood pressure returned to baseline and stable Postop Assessment: no apparent nausea or vomiting Anesthetic complications: no   No notable events documented.  Last Vitals:  Vitals:   10/01/22 0841 10/01/22 0843  BP:  123/72  Pulse: (!) 112 61  Resp: 20 19  Temp:  36.7 C  SpO2: 100% 97%    Last Pain:  Vitals:   10/01/22 0843  TempSrc: Temporal  PainSc: 0-No pain                 Jahnyla Parrillo,E. Chanc Kervin

## 2022-10-14 ENCOUNTER — Other Ambulatory Visit: Payer: Self-pay | Admitting: Physician Assistant

## 2022-10-15 ENCOUNTER — Other Ambulatory Visit (HOSPITAL_COMMUNITY): Payer: Self-pay | Admitting: Surgery

## 2022-10-17 ENCOUNTER — Encounter (HOSPITAL_COMMUNITY): Payer: Medicare HMO | Attending: Cardiology

## 2022-10-30 ENCOUNTER — Telehealth: Payer: Self-pay

## 2022-10-30 ENCOUNTER — Other Ambulatory Visit (HOSPITAL_COMMUNITY): Payer: Self-pay

## 2022-10-30 NOTE — Telephone Encounter (Signed)
Pharmacy Patient Advocate Encounter   Received notification from [facility] that prior authorization for [facility] is needed.    PA submitted on 10/30/22 Key [id number] Status is pending  Haze Rushing, CPhT Pharmacy Patient Advocate Specialist Direct Number: [phone] Fax: [phone]

## 2022-10-30 NOTE — Telephone Encounter (Signed)
Pharmacy Patient Advocate Encounter  Prior Authorization for [facility] has been approved.    Effective dates: 10/30/22 through 06/09/23  Haze Rushing, CPhT Pharmacy Patient Advocate Specialist Direct Number: [phone] Fax: [phone]

## 2022-11-04 ENCOUNTER — Encounter: Payer: Self-pay | Admitting: Nurse Practitioner

## 2022-11-04 ENCOUNTER — Ambulatory Visit: Payer: Medicare HMO | Attending: Nurse Practitioner | Admitting: Nurse Practitioner

## 2022-11-04 VITALS — BP 124/74 | HR 62 | Ht 65.0 in | Wt 251.2 lb

## 2022-11-04 DIAGNOSIS — Z952 Presence of prosthetic heart valve: Secondary | ICD-10-CM | POA: Diagnosis not present

## 2022-11-04 DIAGNOSIS — I350 Nonrheumatic aortic (valve) stenosis: Secondary | ICD-10-CM | POA: Diagnosis not present

## 2022-11-04 DIAGNOSIS — I4892 Unspecified atrial flutter: Secondary | ICD-10-CM | POA: Diagnosis not present

## 2022-11-04 DIAGNOSIS — Z7984 Long term (current) use of oral hypoglycemic drugs: Secondary | ICD-10-CM

## 2022-11-04 DIAGNOSIS — I10 Essential (primary) hypertension: Secondary | ICD-10-CM

## 2022-11-04 DIAGNOSIS — E785 Hyperlipidemia, unspecified: Secondary | ICD-10-CM | POA: Diagnosis not present

## 2022-11-04 DIAGNOSIS — R519 Headache, unspecified: Secondary | ICD-10-CM | POA: Diagnosis not present

## 2022-11-04 DIAGNOSIS — R0683 Snoring: Secondary | ICD-10-CM

## 2022-11-04 DIAGNOSIS — E118 Type 2 diabetes mellitus with unspecified complications: Secondary | ICD-10-CM

## 2022-11-04 DIAGNOSIS — I4819 Other persistent atrial fibrillation: Secondary | ICD-10-CM | POA: Diagnosis not present

## 2022-11-04 NOTE — Progress Notes (Signed)
Office Visit    Patient Name: Nancy Oliver Date of Encounter: 11/04/2022  Primary Care Provider:  Milus Height, PA Primary Cardiologist:  Rollene Rotunda, MD  Chief Complaint    62 year old female with a history of bicuspid aortic valve, severe aortic stenosis s/p AVR in 08/2022, postop atrial fibrillation, palpitations, hypertension, hyperlipidemia, and type 2 diabetes who presents for follow-up related to atrial fibrillation and aortic stenosis.   Past Medical History    Past Medical History:  Diagnosis Date   Anxiety    Arthritis    Depression    Diabetes mellitus without complication (HCC)    GERD (gastroesophageal reflux disease)    Heart murmur    Hyperlipidemia    Hypertension    Migraines    Neuropathy    Obesity    Right sided sciatica    Past Surgical History:  Procedure Laterality Date   AORTIC VALVE REPLACEMENT N/A 08/11/2022   Procedure: AORTIC VALVE REPLACEMENT (AVR) USING INSPIRIS SIZE ;  Surgeon: Alleen Borne, MD;  Location: [facility] OR;  Service: Open Heart Surgery;  Laterality: N/A;   BREAST SURGERY Left 04/2014   biopsy    CARDIOVERSION N/A 10/01/2022   Procedure: CARDIOVERSION;  Surgeon: Chilton Si, MD;  Location: [facility] INVASIVE CV LAB;  Service: Cardiovascular;  Laterality: N/A;   CESAREAN SECTION     HYSTEROSCOPY WITH D & C N/A 08/20/2021   Procedure: DILATATION AND CURETTAGE /HYSTEROSCOPY;  Surgeon: Myna Hidalgo, DO;  Location: [facility] ORS;  Service: Gynecology;  Laterality: N/A;   KNEE SURGERY     LEFT HEART CATH AND CORONARY ANGIOGRAPHY N/A 07/09/2022   Procedure: LEFT HEART CATH AND CORONARY ANGIOGRAPHY;  Surgeon: Orbie Pyo, MD;  Location: [facility] INVASIVE CV LAB;  Service: Cardiovascular;  Laterality: N/A;   NASAL SEPTOPLASTY W/ TURBINOPLASTY     POLYPECTOMY N/A 08/20/2021   Procedure: POLYPECTOMY(WITH MYOSURE DEVICE);  Surgeon: Myna Hidalgo, DO;  Location: [facility] ORS;  Service: Gynecology;  Laterality: N/A;   RIGHT HEART CATH N/A 07/09/2022    Procedure: RIGHT HEART CATH;  Surgeon: Orbie Pyo, MD;  Location: [facility] INVASIVE CV LAB;  Service: Cardiovascular;  Laterality: N/A;   TEE WITHOUT CARDIOVERSION N/A 08/11/2022   Procedure: TRANSESOPHAGEAL ECHOCARDIOGRAM;  Surgeon: Alleen Borne, MD;  Location: [facility] OR;  Service: Open Heart Surgery;  Laterality: N/A;    Allergies  No Known Allergies   Labs/Other Studies Reviewed    The following studies were reviewed today:  Cardiac Studies & Procedures   CARDIAC CATHETERIZATION  CARDIAC CATHETERIZATION 07/09/2022  Narrative 1.  Normal left dominant circulation. 2.  Fick cardiac output of 7.2 L/min, Fick cardiac index of 3.3 L/min/m with mean RA pressure of 5 mmHg, RV pressure of 30/1 with an RV end-diastolic pressure of 8 mmHg, mean wedge pressure of 5 mmHg, and mean PA pressure of 16 mmHg.  Recommendation: Cardiothoracic surgical evaluation.  Findings Coronary Findings Diagnostic  Dominance: Left  No diagnostic findings have been documented. Intervention  No interventions have been documented.   STRESS TESTS  EXERCISE TOLERANCE TEST (ETT) 09/06/2020  Narrative  Exercise treadmill: CLinically and electrically negative for ischemia  Hypertensive response  Very poor exercise tolerance   ECHOCARDIOGRAM  ECHOCARDIOGRAM COMPLETE 09/22/2022  Narrative ECHOCARDIOGRAM REPORT    Patient Name:   Nancy Oliver Date of Exam: 09/22/2022 Medical Rec #:  [id number]       Height:       65.0 in Accession #:    [id number]  Weight:       243.0 lb Date of Birth:  [birth year]       BSA:          2.149 m Patient Age:    61 years        BP:           117/83 mmHg Patient Gender: F               HR:           119 bpm. Exam Location:  [address]  Procedure: 2D Echo, Cardiac Doppler and Color Doppler  Indications:    Z95.2 S/p AVR  History:        Patient has prior history of Echocardiogram examinations, most recent 04/29/2022. S/p AVR (23mm Inspiris pericardial  valve 08/11/22), Arrythmias:Atrial Fibrillation; Risk Factors:Hypertension, Diabetes, Dyslipidemia and Obesity. Aortic Valve: 23 mm bioprosthetic valve is present in the aortic position.  Sonographer:    Samule Ohm RDCS Referring Phys: [address]   Sonographer Comments: Global longitudinal strain was attempted. IMPRESSIONS   1. Left ventricular ejection fraction, by estimation, is 60 to 65%. The left ventricle has normal function. The left ventricle has no regional wall motion abnormalities. Left ventricular diastolic parameters are indeterminate. 2. Right ventricular systolic function is normal. The right ventricular size is normal. 3. Left atrial size was mildly dilated. 4. Left lateral pleural effusion. 5. The mitral valve is normal in structure. No evidence of mitral valve regurgitation. No evidence of mitral stenosis. 6. The aortic valve has been repaired/replaced. Aortic valve regurgitation is not visualized. No aortic stenosis is present. There is a 23 mm bioprosthetic valve present in the aortic position. Echo findings are consistent with normal structure and function of the aortic valve prosthesis. Aortic valve area, by VTI measures 1.77 cm. Aortic valve mean gradient measures 8.5 mmHg. Aortic valve Vmax measures 2.02 m/s. 7. The inferior vena cava is normal in size with greater than 50% respiratory variability, suggesting right atrial pressure of 3 mmHg.  FINDINGS Left Ventricle: Left ventricular ejection fraction, by estimation, is 60 to 65%. The left ventricle has normal function. The left ventricle has no regional wall motion abnormalities. The left ventricular internal cavity size was normal in size. There is no left ventricular hypertrophy. Left ventricular diastolic parameters are indeterminate.  Right Ventricle: The right ventricular size is normal. No increase in right ventricular wall thickness. Right ventricular systolic function is normal.  Left Atrium:  Left atrial size was mildly dilated.  Right Atrium: Right atrial size was normal in size.  Pericardium: Left lateral pleural effusion. There is no evidence of pericardial effusion.  Mitral Valve: The mitral valve is normal in structure. No evidence of mitral valve regurgitation. No evidence of mitral valve stenosis.  Tricuspid Valve: The tricuspid valve is normal in structure. Tricuspid valve regurgitation is mild . No evidence of tricuspid stenosis.  Aortic Valve: The aortic valve has been repaired/replaced. Aortic valve regurgitation is not visualized. No aortic stenosis is present. Aortic valve mean gradient measures 8.5 mmHg. Aortic valve peak gradient measures 16.3 mmHg. Aortic valve area, by VTI measures 1.77 cm. There is a 23 mm bioprosthetic valve present in the aortic position. Echo findings are consistent with normal structure and function of the aortic valve prosthesis.  Pulmonic Valve: The pulmonic valve was normal in structure. Pulmonic valve regurgitation is not visualized. No evidence of pulmonic stenosis.  Aorta: The aortic root is normal in size and structure.  Venous: The inferior vena  cava is normal in size with greater than 50% respiratory variability, suggesting right atrial pressure of 3 mmHg.  IAS/Shunts: No atrial level shunt detected by color flow Doppler.  Additional Comments: There is pleural effusion in the left lateral region.   LEFT VENTRICLE PLAX 2D LVIDd:         3.90 cm   Diastology LVIDs:         2.80 cm   LV e' medial:    8.49 cm/s LV PW:         1.10 cm   LV E/e' medial:  16.4 LV IVS:        1.00 cm   LV e' lateral:   11.00 cm/s LVOT diam:     2.20 cm   LV E/e' lateral: 12.6 LV SV:         61 LV SV Index:   28 LVOT Area:     3.80 cm   RIGHT VENTRICLE            IVC RV S prime:     8.59 cm/s  IVC diam: 1.70 cm TAPSE (M-mode): 1.1 cm RVSP:           23.8 mmHg  LEFT ATRIUM             Index        RIGHT ATRIUM           Index LA diam:         4.50 cm 2.09 cm/m   RA Pressure: 3.00 mmHg LA Vol (A2C):   74.4 ml 34.62 ml/m  RA Area:     18.60 cm LA Vol (A4C):   72.0 ml 33.51 ml/m  RA Volume:   54.40 ml  25.32 ml/m LA Biplane Vol: 75.1 ml 34.95 ml/m AORTIC VALVE AV Area (Vmax):    1.70 cm AV Area (Vmean):   1.76 cm AV Area (VTI):     1.77 cm AV Vmax:           202.00 cm/s AV Vmean:          137.000 cm/s AV VTI:            0.344 m AV Peak Grad:      16.3 mmHg AV Mean Grad:      8.5 mmHg LVOT Vmax:         90.20 cm/s LVOT Vmean:        63.400 cm/s LVOT VTI:          0.160 m LVOT/AV VTI ratio: 0.47  AORTA Ao Root diam: 3.10 cm Ao Asc diam:  3.60 cm  MITRAL VALVE                TRICUSPID VALVE MV Area (PHT): 3.79 cm     TR Peak grad:   20.8 mmHg MV Decel Time: 200 msec     TR Vmax:        228.00 cm/s MV E velocity: 139.00 cm/s  Estimated RAP:  3.00 mmHg RVSP:           23.8 mmHg  SHUNTS Systemic VTI:  0.16 m Systemic Diam: 2.20 cm  Donato Schultz MD Electronically signed by Donato Schultz MD Signature Date/Time: 09/22/2022/12:18:57 PM    Final   TEE  ECHO INTRAOPERATIVE TEE 08/11/2022  Narrative *INTRAOPERATIVE TRANSESOPHAGEAL REPORT *    Patient Name:   Nancy Oliver Date of Exam: 08/11/2022 Medical Rec #:  [id number]       Height:  65.0 in Accession #:    [id number]      Weight:       240.0 lb Date of Birth:  1961/04/21       BSA:          2.14 m Patient Age:    61 years        BP:           113/63 mmHg Patient Gender: F               HR:           81 bpm. Exam Location:  Anesthesiology  Transesophogeal exam was perform intraoperatively during surgical procedure. Patient was closely monitored under general anesthesia during the entirety of examination.  Indications:     Q23.1 Bicuspid aortic valve, Aortic Stenosis i35.0 Sonographer:     Irving Burton Senior RDCS Performing Phys: [id number] Alleen Borne Diagnosing Phys: Eilene Ghazi MD  Complications: No known complications during this  procedure. POST-OP IMPRESSIONS _ Left Ventricle: The left ventricle is unchanged from pre-bypass. _ Right Ventricle: The right ventricle appears unchanged from pre-bypass. _ Aorta: The aorta appears unchanged from pre-bypass. _ Left Atrium: The left atrium appears unchanged from pre-bypass. _ Left Atrial Appendage: The left atrial appendage appears unchanged from pre-bypass. _ Aortic Valve: A bioprosthetic valve was placed, leaflets are freely mobile Manufactured by; inspiris Size; 23mm. _ Mitral Valve: The mitral valve appears unchanged from pre-bypass. _ Tricuspid Valve: The tricuspid valve appears unchanged from pre-bypass. _ Pulmonic Valve: The pulmonic valve appears unchanged from pre-bypass. _ Interatrial Septum: The interatrial septum appears unchanged from pre-bypass. _ Interventricular Septum: The interventricular septum appears unchanged from pre-bypass. _ Pericardium: The pericardium appears unchanged from pre-bypass.  PRE-OP FINDINGS Left Ventricle: The left ventricle has normal systolic function, with an ejection fraction of 60-65%. The cavity size was normal. There is no increase in left ventricular wall thickness. There is no left ventricular hypertrophy.   Right Ventricle: The right ventricle has normal systolic function. The cavity was normal. There is no increase in right ventricular wall thickness.  Left Atrium: Left atrial size was normal in size. No left atrial/left atrial appendage thrombus was detected. The left atrial appendage is well visualized and there is no evidence of thrombus present.  Right Atrium: Right atrial size was normal in size.  Interatrial Septum: No atrial level shunt detected by color flow Doppler. Agitated saline contrast was given intravenously to evaluate for intracardiac shunting. There is no evidence of a patent foramen ovale.  Pericardium: Trivial pericardial effusion is present.  Mitral Valve: The mitral valve is normal in structure.  Mitral valve regurgitation is not visualized by color flow Doppler. There is No evidence of mitral stenosis.  Tricuspid Valve: The tricuspid valve was normal in structure. Tricuspid valve regurgitation is trivial by color flow Doppler. No evidence of tricuspid stenosis is present.  Aortic Valve: The aortic valve is bicuspid Aortic valve regurgitation is mild by color flow Doppler. There is severe stenosis of the aortic valve. There is severe calcifcation present on the aortic valve non-coronary cusp with severely decreased mobility. Right and Left coronary cusps are fused, with mildly decreased mobility.  Pulmonic Valve: The pulmonic valve was normal in structure, with normal. Pulmonic valve regurgitation is not visualized by color flow Doppler.   Aorta: The aortic root and ascending aorta are normal in size and structure.  Shunts: There is no evidence of an atrial septal defect.  +-------------+-----------++ AORTIC VALVE             +-------------+-----------++  AV Vmax:     340.00 cm/s +-------------+-----------++ AV VTI:      0.820 m     +-------------+-----------++ AV Peak Grad:46.2 mmHg   +-------------+-----------++ AV Mean Grad:30.0 mmHg   +-------------+-----------++   Eilene Ghazi MD Electronically signed by Eilene Ghazi MD Signature Date/Time: 08/11/2022/1:58:03 PM    Final   MONITORS  LONG TERM MONITOR (3-14 DAYS) 04/27/2022  Narrative Normal sinus rhythm Rare supraventricular tachycardia with the longest run being 4 beats No sustained arrhythmias          Recent Labs: 08/12/2022: Magnesium 2.1 09/24/2022: ALT 9; BUN 17; Creatinine, Ser 0.90; Hemoglobin 13.9; Platelets 252; Potassium 4.9; Sodium 141  Recent Lipid Panel    Component Value Date/Time   CHOL 169 09/24/2022 1233   TRIG 84 09/24/2022 1233   HDL 64 09/24/2022 1233   CHOLHDL 2.6 09/24/2022 1233   CHOLHDL 3.4 06/17/2013 0946   VLDL 16 06/17/2013 0946   LDLCALC 89 09/24/2022 1233     History of Present Illness    62 year old female with the above past medical history including bicuspid aortic valve, severe aortic stenosis s/p AVR in 08/2022, postop atrial fibrillation, palpitations, hypertension, hyperlipidemia, and type 2 diabetes.   Echocardiogram in 2020 showed EF 60 to 65%, mild aortic stenosis, tricuspid valve with mild calcification.  ETT in 08/2020 was normal.  Most recent echocardiogram in 11/23 revealed bicuspid aortic valve with severe AS (mean gradient 38 mmHg), EF 60 to 65%, normal LV function, no RWMA, mild LVH, G1 DD, normal RV function. R/LHC in anticipation of surgery in 06/2022 showed no evidence of CAD.  She was referred to CT surgery and underwent elective aortic valve replacement with 23 mm Inspiris valve on 08/11/2022.  Postop course was complicated by atrial fibrillation. She was started on amiodarone and converted to NSR.  Anticoagulation was deferred. At her follow-up visit in 08/2022 she was noted to be in atrial flutter, HR 125 bpm.  She did note increased fatigue.  She was started on Eliquis, amiodarone was increased to 200 mg twice daily.  Metoprolol was increased to 37.5 mg twice daily.  She was last seen in the office on 09/22/2022 was stable overall from a cardiac standpoint though she did remain in atrial flutter with elevated heart rate.  She did note ongoing fatigue, increased bilateral lower extremity edema.  She underwent DCCV on 10/01/2022 with restoration of sinus rhythm.   She presents today for follow-up accompanied by her son. Since her last visit she has been stable from a cardiac standpoint. She denies any palpitations or awareness of any breakthrough atrial fibrillation.  She denies chest pain, dyspnea, edema, PND, orthopnea, weight gain. She has noted some morning headaches, daytime fatigue. She reports a history of snoring-she notes she has never had a sleep study. Overall, she reports feeling well.  Home Medications    Current Outpatient  Medications  Medication Sig Dispense Refill   ACCU-CHEK AVIVA PLUS test strip      Accu-Chek Softclix Lancets lancets      allopurinol (ZYLOPRIM) 300 MG tablet Take 300 mg by mouth daily as needed (gout flare).     ALPRAZolam (XANAX) 0.5 MG tablet TAKE ONE TABLET BY MOUTH EVERY 8 HOURS AS NEEDED 60 tablet 0   amiodarone (PACERONE) 200 MG tablet Take 1 tablet (200 mg total) by mouth 2 (two) times daily. (Patient taking differently: Take 200 mg by mouth daily.) 60 tablet 11   apixaban (ELIQUIS) 5 MG TABS tablet Take 1 tablet (5  mg total) by mouth 2 (two) times daily. 60 tablet 6   atorvastatin (LIPITOR) 80 MG tablet Take 80 mg by mouth daily.     BD PEN NEEDLE NANO 2ND GEN 32G X 4 MM MISC USE 1 PEN NEEDLE THREE TIMES DAILY     Blood Glucose Monitoring Suppl (ACCU-CHEK AVIVA PLUS) w/Device KIT See admin instructions.     Dulaglutide (TRULICITY) 3 MG/0.5ML SOPN Inject 3 mg into the skin every Sunday.     DULoxetine (CYMBALTA) 60 MG capsule Take 60 mg by mouth daily as needed (anxiety).     furosemide (LASIX) 40 MG tablet Take 1 tablet (40 mg total) by mouth daily. X 7 days, then you may resume 20 mg daily prn edema (Patient taking differently: Take 40 mg by mouth daily.) 7 tablet 0   metFORMIN (GLUCOPHAGE) 1000 MG tablet Take 1,000 mg by mouth 2 (two) times daily with a meal.     metoprolol tartrate (LOPRESSOR) 25 MG tablet Take 1.5 tablets (37.5 mg total) by mouth 2 (two) times daily. 180 tablet 3   norethindrone (MICRONOR) 0.35 MG tablet Take 1 tablet by mouth daily.     omeprazole (PRILOSEC) 40 MG capsule Take 40 mg by mouth daily as needed (acid reflux).     Polyethyl Glycol-Propyl Glycol (SYSTANE OP) Place 1 drop into both eyes daily.     TRESIBA FLEXTOUCH 100 UNIT/ML SOPN FlexTouch Pen Inject 25 Units into the skin daily.     aspirin EC 325 MG tablet Take 1 tablet (325 mg total) by mouth daily. (Patient not taking: Reported on 11/04/2022)     oxyCODONE (OXY IR/ROXICODONE) 5 MG immediate release  tablet Take 1 tablet (5 mg total) by mouth every 4 (four) hours as needed for severe pain. (Patient not taking: Reported on 11/04/2022) 30 tablet 0   progesterone (PROMETRIUM) 100 MG capsule Take 1 capsule (100 mg total) by mouth daily. 90 capsule 4   No current facility-administered medications for this visit.     Review of Systems    She denies chest pain, palpitations, dyspnea, pnd, orthopnea, n, v, dizziness, syncope, edema, weight gain, or early satiety. All other systems reviewed and are otherwise negative except as noted above.   Physical Exam    VS:  BP 124/74 (BP Location: Left Arm, Patient Position: Sitting, Cuff Size: Large)   Pulse 62   Ht 5\' 5"  (1.651 m)   Wt 251 lb 3.2 oz (113.9 kg)   LMP 10/07/2012   SpO2 96%   BMI 41.80 kg/m  GEN: Well nourished, well developed, in no acute distress. HEENT: normal. Neck: Supple, no JVD, carotid bruits, or masses. Cardiac: RRR, no murmurs, rubs, or gallops. No clubbing, cyanosis, edema.  Radials/DP/PT 2+ and equal bilaterally.  Respiratory:  Respirations regular and unlabored, clear to auscultation bilaterally. GI: Soft, nontender, nondistended, BS + x 4. MS: no deformity or atrophy. Skin: warm and dry, no rash. Neuro:  Strength and sensation are intact. Psych: Normal affect.  Accessory Clinical Findings    ECG personally reviewed by me today - NSR, 62 bpm - no acute changes.   Lab Results  Component Value Date   WBC 9.7 09/24/2022   HGB 13.9 09/24/2022   HCT 43.5 09/24/2022   MCV 93 09/24/2022   PLT 252 09/24/2022   Lab Results  Component Value Date   CREATININE 0.90 09/24/2022   BUN 17 09/24/2022   NA 141 09/24/2022   K 4.9 09/24/2022   CL 102 09/24/2022  CO2 25 09/24/2022   Lab Results  Component Value Date   ALT 9 09/24/2022   AST 12 09/24/2022   ALKPHOS 121 09/24/2022   BILITOT 0.7 09/24/2022   Lab Results  Component Value Date   CHOL 169 09/24/2022   HDL 64 09/24/2022   LDLCALC 89 09/24/2022   TRIG  84 09/24/2022   CHOLHDL 2.6 09/24/2022    Lab Results  Component Value Date   HGBA1C 6.4 (H) 08/07/2022    Assessment & Plan      1. Aortic stenosis: Severe, s/p AVR in on 08/11/2022.  Postop echocardiogram showed EF 60 to 65%, normal LV function, no RWMA, indeterminate diastolic parameters, normal RV systolic function, stable prosthetic aortic valve, mean gradient 8.5 mmHg.  Euvolemic and well compensated on exam.  Continue SBE prophylaxis.  2. Persistent atrial fibrillation/atrial flutter: Originally occurred in the postop setting. S/p DCCV.  Maintaining sinus rhythm. Will decrease amiodarone to 200 mg daily.  Discussed ongoing monitoring with Kardia mobile device. Continue metoprolol, Eliquis.  3. Hypertension: BP well controlled. Continue current antihypertensive regimen.    4. Hyperlipidemia: LDL was 89 in 09/2022.  Continue Lipitor.   5. Type 2 diabetes: A1c was 6.3 in 08/2022. Monitored and managed per PCP.   6.  History of snoring/daytime fatigue/morning headaches: Epworth scale of 9, STOP-BANG score of 6.  Will proceed with sleep study to rule out OSA.     7. Disposition: Follow-up in 3 to 4 months.        Joylene Grapes, NP 11/04/2022, 5:19 PM

## 2022-11-04 NOTE — Patient Instructions (Signed)
Medication Instructions:  Decrease Amiodarone 200 mg daily.  *If you need a refill on your cardiac medications before your next appointment, please call your pharmacy*   Lab Work: NONE ordered at this time of appointment   If you have labs (blood work) drawn today and your tests are completely normal, you will receive your results only by: MyChart Message (if you have MyChart) OR A paper copy in the mail If you have any lab test that is abnormal or we need to change your treatment, we will call you to review the results.   Testing/Procedures: Your physician has recommended that you have a sleep study. This test records several body functions during sleep, including: brain activity, eye movement, oxygen and carbon dioxide blood levels, heart rate and rhythm, breathing rate and rhythm, the flow of air through your mouth and nose, snoring, body muscle movements, and chest and belly movement.    Follow-Up: At [facility], you and your health needs are our priority.  As part of our continuing mission to provide you with exceptional heart care, we have created designated Provider Care Teams.  These Care Teams include your primary Cardiologist (physician) and Advanced Practice Providers (APPs -  Physician Assistants and Nurse Practitioners) who all work together to provide you with the care you need, when you need it.  We recommend signing up for the patient portal called "MyChart".  Sign up information is provided on this After Visit Summary.  MyChart is used to connect with patients for Virtual Visits (Telemedicine).  Patients are able to view lab/test results, encounter notes, upcoming appointments, etc.  Non-urgent messages can be sent to your provider as well.   To learn more about what you can do with MyChart, go to [email or web address].    Your next appointment:   3-4 month(s)  Provider:   Rollene Rotunda, MD  or Bernadene Person, NP        Other Instructions Discussed  KardiaMobile device with pt.

## 2022-11-05 ENCOUNTER — Other Ambulatory Visit: Payer: Self-pay | Admitting: Physician Assistant

## 2022-11-05 DIAGNOSIS — Z1231 Encounter for screening mammogram for malignant neoplasm of breast: Secondary | ICD-10-CM

## 2022-11-12 DIAGNOSIS — M1711 Unilateral primary osteoarthritis, right knee: Secondary | ICD-10-CM | POA: Diagnosis not present

## 2022-11-19 DIAGNOSIS — E1169 Type 2 diabetes mellitus with other specified complication: Secondary | ICD-10-CM | POA: Diagnosis not present

## 2022-11-19 DIAGNOSIS — M109 Gout, unspecified: Secondary | ICD-10-CM | POA: Diagnosis not present

## 2022-11-19 DIAGNOSIS — Z6841 Body Mass Index (BMI) 40.0 and over, adult: Secondary | ICD-10-CM | POA: Diagnosis not present

## 2022-11-19 DIAGNOSIS — I10 Essential (primary) hypertension: Secondary | ICD-10-CM | POA: Diagnosis not present

## 2022-11-19 DIAGNOSIS — E1140 Type 2 diabetes mellitus with diabetic neuropathy, unspecified: Secondary | ICD-10-CM | POA: Diagnosis not present

## 2022-11-19 DIAGNOSIS — E7800 Pure hypercholesterolemia, unspecified: Secondary | ICD-10-CM | POA: Diagnosis not present

## 2022-11-19 DIAGNOSIS — I4892 Unspecified atrial flutter: Secondary | ICD-10-CM | POA: Diagnosis not present

## 2022-11-19 DIAGNOSIS — D6869 Other thrombophilia: Secondary | ICD-10-CM | POA: Diagnosis not present

## 2022-11-19 DIAGNOSIS — I4819 Other persistent atrial fibrillation: Secondary | ICD-10-CM | POA: Diagnosis not present

## 2022-11-20 DIAGNOSIS — Z0100 Encounter for examination of eyes and vision without abnormal findings: Secondary | ICD-10-CM | POA: Diagnosis not present

## 2022-11-20 DIAGNOSIS — H524 Presbyopia: Secondary | ICD-10-CM | POA: Diagnosis not present

## 2022-11-20 DIAGNOSIS — H40053 Ocular hypertension, bilateral: Secondary | ICD-10-CM | POA: Diagnosis not present

## 2022-11-26 ENCOUNTER — Ambulatory Visit: Payer: Medicare HMO

## 2022-11-26 ENCOUNTER — Telehealth: Payer: Self-pay | Admitting: Cardiology

## 2022-11-26 NOTE — Telephone Encounter (Signed)
Spoke to the patient, she will like to know if the health insurance company has approved the sleep study. Advised pt the office will contact her once we receive a response from her health insurance company. Will forward to piror/auth for advise.

## 2022-11-26 NOTE — Telephone Encounter (Signed)
Patient would like to know if her sleep study has been authorized by insurance. Please advise.

## 2022-12-02 DIAGNOSIS — Z6841 Body Mass Index (BMI) 40.0 and over, adult: Secondary | ICD-10-CM | POA: Diagnosis not present

## 2022-12-02 DIAGNOSIS — D485 Neoplasm of uncertain behavior of skin: Secondary | ICD-10-CM | POA: Diagnosis not present

## 2022-12-03 ENCOUNTER — Ambulatory Visit
Admission: RE | Admit: 2022-12-03 | Discharge: 2022-12-03 | Disposition: A | Discharge status: Home / Self Care | Payer: Medicare HMO | Source: Ambulatory Visit | Attending: Physician Assistant | Admitting: Physician Assistant

## 2022-12-03 DIAGNOSIS — Z1231 Encounter for screening mammogram for malignant neoplasm of breast: Secondary | ICD-10-CM

## 2022-12-04 DIAGNOSIS — D485 Neoplasm of uncertain behavior of skin: Secondary | ICD-10-CM | POA: Diagnosis not present

## 2022-12-04 DIAGNOSIS — L821 Other seborrheic keratosis: Secondary | ICD-10-CM | POA: Diagnosis not present

## 2022-12-08 NOTE — Telephone Encounter (Signed)
**Note De-Identified Nancy Oliver Obfuscation** Per Nancy Oliver at [facility], a PA is not required for a Nancy Oliver. Reference#: [id number]  I have made the pt aware of this Nancy Oliver telephone call and I provided her with the password for her Nancy Oliver of "[id number]". The pt verbalized understanding and is aware to call us if she has any questions or concerns. She did thank me for my call.

## 2023-02-10 ENCOUNTER — Telehealth: Payer: Self-pay | Admitting: Cardiology

## 2023-02-10 NOTE — Telephone Encounter (Signed)
Patient states she is in the doughnut hole and her Eliquis went from $45 to $168. She is on a fixed income and states that will be hard to afford. She will like help with patient assistance and is requesting samples.

## 2023-02-10 NOTE — Telephone Encounter (Signed)
Pt c/o medication issue:  1. Name of Medication: apixaban (ELIQUIS) 5 MG TABS tablet   2. How are you currently taking this medication (dosage and times per day)? As prescribed - Has not taken for a week.   3. Are you having a reaction (difficulty breathing--STAT)?   4. What is your medication issue? Patient states that she has been without this medication for a week. She states she is currently in the "donut hole" and would like to get further information on any assistance for this medication or samples. Please advise.

## 2023-02-10 NOTE — Telephone Encounter (Signed)
Spoke with patient. She will pick up samples tomorrow.

## 2023-02-10 NOTE — Telephone Encounter (Signed)
Ok to provide 1-2 weeks of samples while pt is filling out patient assistance. Pt assistance form found here: [email or web address]

## 2023-02-15 NOTE — Progress Notes (Signed)
  Cardiology Office Note:   Date:  02/15/2023  ID:  Nancy Oliver, DOB Oct 03, 1960, MRN [id number] PCP: Milus Height, PA  [facility] Providers Cardiologist:  Rollene Rotunda, MD {  History of Present Illness:   Nancy Oliver is a 62 y.o. female  who presents for follow up of murmur.  She also was found apparently to have some ectopy though I do not have an EKG.  She had an echo in March with an EF of 60 - 65% with some mild aortic stenosis with a tricuspid valve but with some calcification.    ***   ***  At the last visit she was having dyspnea and I ordered a Lexiscan Myoview. However, this was cancelled.  She was anxious about doing that.  She still getting short of breath.  She walks down to her dogs who are about 50 yards away.  Its only very slight incline.  She will make it sound that she knows is short of breath.  She is not really describing chest pressure, neck or arm discomfort.  She is not having any new palpitations, presyncope or syncope.  She does not have PND or orthopnea.    ROS: ***  Studies Reviewed:    EKG:       ***  Risk Assessment/Calculations:   {Does this patient have ATRIAL FIBRILLATION?[phone]} No BP recorded.  {Refresh Note OR Click here to enter BP  :1}***   STOP-Bang Score:  6  { Consider Dx Sleep Disordered Breathing or Sleep Apnea  ICD G47.33          :1}     Physical Exam:   VS:  LMP 10/07/2012    Wt Readings from Last 3 Encounters:  11/04/22 251 lb 3.2 oz (113.9 kg)  10/01/22 240 lb (108.9 kg)  09/22/22 242 lb 9.6 oz (110 kg)     GEN: Well nourished, well developed in no acute distress NECK: No JVD; No carotid bruits CARDIAC: ***RRR, no murmurs, rubs, gallops RESPIRATORY:  Clear to auscultation without rales, wheezing or rhonchi  ABDOMEN: Soft, non-tender, non-distended EXTREMITIES:  No edema; No deformity   ASSESSMENT AND PLAN:   SAVR:    She had stable aortic valve replacement.  *** S:  This was mild on echo in 2020.  I  will follow this clinically.  No echo is indicated at this point.   DYSPNEA:  ***  This likely is multifactorial and we talked about it again.  Given her risk factors and this she needs stress testing but she does not want a Lexiscan Myoview and I think is very reasonable to order a POET (Plain Old Exercise Treadmill)   HTN:   Blood pressure is ***  at target.  No change in therapy.    DYSLIPIDEMIA:   Her LDL previously was *** 81 but I do not have recent labs and she is going to have this done by her primary provider.    DM:    The last A1c was ***  hat I can see was 7.4.  She understands the goal of therapy and she will continue to follow with Redmon, Noelle, PA      {Are you ordering a CV Procedure (e.g. stress test, cath, DCCV, TEE, etc)?   Press F2        :[id number]}  Follow up ***  Signed, Rollene Rotunda, MD

## 2023-02-18 ENCOUNTER — Ambulatory Visit: Payer: Medicare HMO | Admitting: Cardiology

## 2023-02-18 ENCOUNTER — Encounter: Payer: Self-pay | Admitting: Cardiology

## 2023-02-18 VITALS — BP 130/82 | HR 68 | Ht 65.0 in | Wt 268.0 lb

## 2023-02-18 DIAGNOSIS — Z952 Presence of prosthetic heart valve: Secondary | ICD-10-CM

## 2023-02-18 DIAGNOSIS — R0602 Shortness of breath: Secondary | ICD-10-CM | POA: Diagnosis not present

## 2023-02-18 DIAGNOSIS — E118 Type 2 diabetes mellitus with unspecified complications: Secondary | ICD-10-CM | POA: Diagnosis not present

## 2023-02-18 DIAGNOSIS — E785 Hyperlipidemia, unspecified: Secondary | ICD-10-CM | POA: Diagnosis not present

## 2023-02-18 DIAGNOSIS — Z7984 Long term (current) use of oral hypoglycemic drugs: Secondary | ICD-10-CM

## 2023-02-18 NOTE — Patient Instructions (Signed)
Medication Instructions:  Please discontinue your Amiodarone. Continue all other medications as listed.  *If you need a refill on your cardiac medications before your next appointment, please call your pharmacy*  Follow-Up: At [facility], you and your health needs are our priority.  As part of our continuing mission to provide you with exceptional heart care, we have created designated Provider Care Teams.  These Care Teams include your primary Cardiologist (physician) and Advanced Practice Providers (APPs -  Physician Assistants and Nurse Practitioners) who all work together to provide you with the care you need, when you need it.  We recommend signing up for the patient portal called "MyChart".  Sign up information is provided on this After Visit Summary.  MyChart is used to connect with patients for Virtual Visits (Telemedicine).  Patients are able to view lab/test results, encounter notes, upcoming appointments, etc.  Non-urgent messages can be sent to your provider as well.   To learn more about what you can do with MyChart, go to [email or web address].    Your next appointment:   6 month(s)  Provider:   Rollene Rotunda, MD

## 2023-02-19 ENCOUNTER — Other Ambulatory Visit: Payer: Self-pay | Admitting: Obstetrics & Gynecology

## 2023-02-19 DIAGNOSIS — N950 Postmenopausal bleeding: Secondary | ICD-10-CM

## 2023-02-19 MED ORDER — NORETHINDRONE 0.35 MG PO TABS
1.0000 | ORAL_TABLET | Freq: Every day | ORAL | 4 refills | Status: DC
Start: 2023-02-19 — End: 2023-09-17

## 2023-02-19 NOTE — Progress Notes (Signed)
Rx for progestin sent in  Myna Hidalgo, DO Attending Obstetrician & Gynecologist, [facility] for [facility], [facility]

## 2023-05-02 ENCOUNTER — Other Ambulatory Visit: Payer: Self-pay | Admitting: Nurse Practitioner

## 2023-05-29 DIAGNOSIS — D6869 Other thrombophilia: Secondary | ICD-10-CM | POA: Diagnosis not present

## 2023-05-29 DIAGNOSIS — Z0000 Encounter for general adult medical examination without abnormal findings: Secondary | ICD-10-CM | POA: Diagnosis not present

## 2023-05-29 DIAGNOSIS — I4892 Unspecified atrial flutter: Secondary | ICD-10-CM | POA: Diagnosis not present

## 2023-05-29 DIAGNOSIS — I10 Essential (primary) hypertension: Secondary | ICD-10-CM | POA: Diagnosis not present

## 2023-05-29 DIAGNOSIS — F339 Major depressive disorder, recurrent, unspecified: Secondary | ICD-10-CM | POA: Diagnosis not present

## 2023-05-29 DIAGNOSIS — E1169 Type 2 diabetes mellitus with other specified complication: Secondary | ICD-10-CM | POA: Diagnosis not present

## 2023-05-29 DIAGNOSIS — M109 Gout, unspecified: Secondary | ICD-10-CM | POA: Diagnosis not present

## 2023-05-29 DIAGNOSIS — E1140 Type 2 diabetes mellitus with diabetic neuropathy, unspecified: Secondary | ICD-10-CM | POA: Diagnosis not present

## 2023-05-29 DIAGNOSIS — E7800 Pure hypercholesterolemia, unspecified: Secondary | ICD-10-CM | POA: Diagnosis not present

## 2023-06-07 ENCOUNTER — Other Ambulatory Visit: Payer: Self-pay | Admitting: Nurse Practitioner

## 2023-06-08 NOTE — Telephone Encounter (Signed)
Prescription refill request for Eliquis received. Indication:afib Last office visit:5/24 Scr:0.90  4/24 Age: 62 Weight:121.6  kg  Prescription refilled

## 2023-07-24 ENCOUNTER — Telehealth: Payer: Self-pay

## 2023-07-24 ENCOUNTER — Other Ambulatory Visit (HOSPITAL_COMMUNITY): Payer: Self-pay

## 2023-07-24 NOTE — Telephone Encounter (Signed)
Pharmacy Patient Advocate Encounter   Received notification from CoverMyMeds that prior authorization for ELIQUIS is required/requested.   Insurance verification completed.   The patient is insured through [employer] .   Per test claim: PA required; PA submitted to above mentioned insurance via CoverMyMeds Key/confirmation #/EOC [id number] Status is pending

## 2023-07-24 NOTE — Telephone Encounter (Signed)
Pharmacy Patient Advocate Encounter  Received notification from [facility] that Prior Authorization for Nancy Oliver has been APPROVED from 06/10/23 to 06/08/24. Ran test claim, Copay is $297 (DEDUCTIBLE). This test claim was processed through [facility]- copay amounts may vary at other pharmacies due to pharmacy/plan contracts, or as the patient moves through the different stages of their insurance plan.

## 2023-08-16 DIAGNOSIS — I4819 Other persistent atrial fibrillation: Secondary | ICD-10-CM | POA: Insufficient documentation

## 2023-08-16 NOTE — Progress Notes (Unsigned)
  Cardiology Office Note:   Date:  08/16/2023  ID:  [name], DOB Jun 15, 1960, MRN [id number] PCP: Milus Height, PA  [facility] Providers Cardiologist:  Rollene Rotunda, MD {  History of Present Illness:   Nancy Oliver is a 63 y.o. female who presents for follow up of aortic stenosis.  She underwent elective aortic valve replacement with a 23 mm Inspira valve on August 11, 2022.  She did have postoperative atrial fibrillation.  She was treated with Eliquis and amiodarone.  She actually had flutter in her follow-up.  She had eventually cardioversion.   At the last visit I stopped her amiodarone.  ***   ***   She presents for follow-up.      She has actually done quite well.  She says she is breathing better.  She is doing some walking daily.  She is resting better.  The patient denies any new symptoms such as chest discomfort, neck or arm discomfort. There has been no new shortness of breath, PND or orthopnea. There have been no reported palpitations, presyncope or syncope.     ROS: ***  Studies Reviewed:    EKG:       ***  Risk Assessment/Calculations:    CHA2DS2-VASc Score = 3   This indicates a 3.2% annual risk of stroke. The patient's score is based upon:  No BP recorded.  {Refresh Note OR Click here to enter BP  :1}***   STOP-Bang Score:  6  { Consider Dx Sleep Disordered Breathing or Sleep Apnea  ICD G47.33          :1}     Physical Exam:   VS:  LMP 10/07/2012    Wt Readings from Last 3 Encounters:  02/18/23 268 lb (121.6 kg)  11/04/22 251 lb 3.2 oz (113.9 kg)  10/01/22 240 lb (108.9 kg)     GEN: Well nourished, well developed in no acute distress NECK: No JVD; No carotid bruits CARDIAC: ***RR, *** murmurs, rubs, gallops RESPIRATORY:  Clear to auscultation without rales, wheezing or rhonchi  ABDOMEN: Soft, non-tender, non-distended EXTREMITIES:  No edema; No deformity   ASSESSMENT AND PLAN:   SAVR:    She had stable aortic valve replacement in  Apirl 2024.  ***  .  She has done well with this.  She had an echocardiogram in April that demonstrated stable valve replacement.  She understands endocarditis prophylaxis.  No change in therapy.    HTN:   Blood pressure is *** at target.  No change in therapy.   DYSLIPIDEMIA:   Her LDL was *** previously was 89 which I think is reasonable as she has known coronary disease.    DM:    The last A1c was ***  was down to 6.6 from 7.4.  No change in therapy   ATRIAL FIB:  ***  I am going to continue her Eliquis.  However, going to discontinue the amiodarone.  If she has no recurrent fibrillation at the next visit I might consider getting rid of the DOAC.   MORBID OBESITY: ***  Able to recall 3 pounds of weight loss per month and we discussed reduce carbohydrates.  She will continue with her physical activity.   SNORING: She has a home sleep study waiting to be used and we will follow-up the results     Follow up ***  Signed, Rollene Rotunda, MD

## 2023-08-19 ENCOUNTER — Encounter: Payer: Self-pay | Admitting: Cardiology

## 2023-08-19 ENCOUNTER — Ambulatory Visit (INDEPENDENT_AMBULATORY_CARE_PROVIDER_SITE_OTHER): Payer: Medicare HMO | Admitting: Cardiology

## 2023-08-19 VITALS — BP 122/90 | HR 76 | Ht 66.0 in | Wt 278.0 lb

## 2023-08-19 DIAGNOSIS — Z952 Presence of prosthetic heart valve: Secondary | ICD-10-CM

## 2023-08-19 DIAGNOSIS — I10 Essential (primary) hypertension: Secondary | ICD-10-CM

## 2023-08-19 DIAGNOSIS — I4819 Other persistent atrial fibrillation: Secondary | ICD-10-CM | POA: Diagnosis not present

## 2023-08-19 DIAGNOSIS — E118 Type 2 diabetes mellitus with unspecified complications: Secondary | ICD-10-CM | POA: Diagnosis not present

## 2023-08-19 DIAGNOSIS — E785 Hyperlipidemia, unspecified: Secondary | ICD-10-CM | POA: Diagnosis not present

## 2023-08-19 MED ORDER — DILTIAZEM HCL ER COATED BEADS 120 MG PO CP24: 120.0000 mg | ORAL_CAPSULE | Freq: Every day | ORAL | 3 refills | Status: AC

## 2023-08-19 NOTE — Patient Instructions (Signed)
 Medication Instructions:  Please discontinue your Eliquis and Metoprolol. Start Cardizem 120 mg once a day. Continue all other medications as listed.  *If you need a refill on your cardiac medications before your next appointment, please call your pharmacy*  Follow-Up: At [facility], you and your health needs are our priority.  As part of our continuing mission to provide you with exceptional heart care, we have created designated Provider Care Teams.  These Care Teams include your primary Cardiologist (physician) and Advanced Practice Providers (APPs -  Physician Assistants and Nurse Practitioners) who all work together to provide you with the care you need, when you need it.  We recommend signing up for the patient portal called "MyChart".  Sign up information is provided on this After Visit Summary.  MyChart is used to connect with patients for Virtual Visits (Telemedicine).  Patients are able to view lab/test results, encounter notes, upcoming appointments, etc.  Non-urgent messages can be sent to your provider as well.   To learn more about what you can do with MyChart, go to [email or web address].    Your next appointment:   1 year(s)  Provider:   Rollene Rotunda, MD

## 2023-09-17 ENCOUNTER — Ambulatory Visit: Admitting: Obstetrics & Gynecology

## 2023-09-17 ENCOUNTER — Encounter: Payer: Self-pay | Admitting: Obstetrics & Gynecology

## 2023-09-17 VITALS — BP 139/81 | HR 89 | Ht 65.0 in | Wt 285.0 lb

## 2023-09-17 DIAGNOSIS — Z01419 Encounter for gynecological examination (general) (routine) without abnormal findings: Secondary | ICD-10-CM | POA: Diagnosis not present

## 2023-09-17 DIAGNOSIS — R9389 Abnormal findings on diagnostic imaging of other specified body structures: Secondary | ICD-10-CM

## 2023-09-17 DIAGNOSIS — Z952 Presence of prosthetic heart valve: Secondary | ICD-10-CM | POA: Diagnosis not present

## 2023-09-17 DIAGNOSIS — N950 Postmenopausal bleeding: Secondary | ICD-10-CM

## 2023-09-17 MED ORDER — NORETHINDRONE 0.35 MG PO TABS
1.0000 | ORAL_TABLET | Freq: Every day | ORAL | 4 refills | Status: AC
Start: 2023-09-17 — End: 2023-12-16

## 2023-09-17 NOTE — Progress Notes (Signed)
 WELL-WOMAN EXAMINATION Patient name: Nancy Oliver MRN [id number]  Date of birth: Oct 19, 1960 Chief Complaint:   Gynecologic Exam  History of Present Illness:   Nancy Oliver is a 63 y.o. [id number] PM female being seen today for a routine well-woman exam.   In review, pt previously noted PMB and thickened endometrium.  EMB was benign.  Pt has since been on progesterone and reports no vaginal bleeding.  Denies vaginal discharge, itching or irritation.  Denies pelvic or abdominal pain.  No acute complaints.  Patient's last menstrual period was 10/07/2012.  The current method of family planning is  postmenopausal .    Last pap 06/2021.  Last mammogram: 12/2022.     09/17/2023   10:16 AM 06/19/2021   11:19 AM 09/13/2015    7:17 PM  Depression screen PHQ 2/9  Decreased Interest 0 3 0  Down, Depressed, Hopeless 0 3 1  PHQ - 2 Score 0 6 1  Altered sleeping 3 3   Tired, decreased energy 3 3   Change in appetite 0 0   Feeling bad or failure about yourself  0 0   Trouble concentrating 0 0   Moving slowly or fidgety/restless 0 0   Suicidal thoughts 0 0   PHQ-9 Score 6 12       Review of Systems:   Pertinent items are noted in HPI Denies any headaches, blurred vision, fatigue, shortness of breath, chest pain, abdominal pain, bowel movements, urination, or intercourse unless otherwise stated above.  Pertinent History Reviewed:  Reviewed past medical,surgical, social and family history.  Reviewed problem list, medications and allergies. Physical Assessment:   Vitals:   09/17/23 1010 09/17/23 1036  BP: (!) 145/83 139/81  Pulse: 89   Weight: 285 lb (129.3 kg)   Height: 5\' 5"  (1.651 m)   Body mass index is 47.43 kg/m.        Physical Examination:   General appearance - well appearing, and in no distress  Mental status - alert, oriented to person, place, and time  Psych:  She has a normal mood and affect  Skin - warm and dry, normal color, no suspicious lesions noted  Chest -  effort normal, all lung fields clear to auscultation bilaterally  Heart - normal rate and regular rhythm  Neck:  midline trachea, no thyromegaly or nodules  Breasts - breasts appear normal, no suspicious masses, no skin or nipple changes or  axillary nodes  Abdomen - obese, soft, nontender, nondistended, no masses or organomegaly  Pelvic - VULVA: normal appearing vulva with no masses, tenderness or lesions  VAGINA: normal appearing vagina with normal color and white discharge noted, no lesions  CERVIX: normal appearing cervix without discharge or lesions, no CMT  UTERUS: uterus is felt to be normal size, shape, consistency and nontender   ADNEXA: No adnexal masses or tenderness noted. Bimanual exam limited due to body habitus  Extremities:  1+ edema, no calf tenderness bilaterally  Chaperone: Faith Rogue     Assessment & Plan:  1) Well-Woman Exam -pap up to date, reviewed screening guidelines -mammogram up to date  2) PMB/Thickened endometrium -doing well with progesterone, plan to continue []  refill progesterone  No orders of the defined types were placed in this encounter.   Meds:  Meds ordered this encounter  Medications   norethindrone (MICRONOR) 0.35 MG tablet    Sig: Take 1 tablet (0.35 mg total) by mouth daily.    Dispense:  90 tablet    Refill:  4    Follow-up: Return in about 1 year (around 09/16/2024) for Annual.   Myna Hidalgo, DO Attending Obstetrician & Gynecologist, [facility] for [facility], [facility]

## 2023-11-26 DIAGNOSIS — H43391 Other vitreous opacities, right eye: Secondary | ICD-10-CM | POA: Diagnosis not present

## 2023-11-26 DIAGNOSIS — H524 Presbyopia: Secondary | ICD-10-CM | POA: Diagnosis not present

## 2023-12-02 DIAGNOSIS — I4819 Other persistent atrial fibrillation: Secondary | ICD-10-CM | POA: Diagnosis not present

## 2023-12-02 DIAGNOSIS — K219 Gastro-esophageal reflux disease without esophagitis: Secondary | ICD-10-CM | POA: Diagnosis not present

## 2023-12-02 DIAGNOSIS — F339 Major depressive disorder, recurrent, unspecified: Secondary | ICD-10-CM | POA: Diagnosis not present

## 2023-12-02 DIAGNOSIS — I10 Essential (primary) hypertension: Secondary | ICD-10-CM | POA: Diagnosis not present

## 2023-12-02 DIAGNOSIS — G4730 Sleep apnea, unspecified: Secondary | ICD-10-CM | POA: Diagnosis not present

## 2023-12-02 DIAGNOSIS — E1169 Type 2 diabetes mellitus with other specified complication: Secondary | ICD-10-CM | POA: Diagnosis not present

## 2023-12-02 DIAGNOSIS — E7800 Pure hypercholesterolemia, unspecified: Secondary | ICD-10-CM | POA: Diagnosis not present

## 2023-12-02 DIAGNOSIS — G629 Polyneuropathy, unspecified: Secondary | ICD-10-CM | POA: Diagnosis not present

## 2023-12-07 ENCOUNTER — Encounter: Payer: Self-pay | Admitting: Adult Health

## 2023-12-07 ENCOUNTER — Ambulatory Visit: Admitting: Adult Health

## 2023-12-07 VITALS — BP 140/80 | HR 58 | Ht 65.0 in | Wt 284.0 lb

## 2023-12-07 DIAGNOSIS — R3989 Other symptoms and signs involving the genitourinary system: Secondary | ICD-10-CM

## 2023-12-07 DIAGNOSIS — R350 Frequency of micturition: Secondary | ICD-10-CM

## 2023-12-07 DIAGNOSIS — B9689 Other specified bacterial agents as the cause of diseases classified elsewhere: Secondary | ICD-10-CM

## 2023-12-07 DIAGNOSIS — R109 Unspecified abdominal pain: Secondary | ICD-10-CM

## 2023-12-07 DIAGNOSIS — R8290 Unspecified abnormal findings in urine: Secondary | ICD-10-CM

## 2023-12-07 DIAGNOSIS — N898 Other specified noninflammatory disorders of vagina: Secondary | ICD-10-CM | POA: Insufficient documentation

## 2023-12-07 DIAGNOSIS — N760 Acute vaginitis: Secondary | ICD-10-CM

## 2023-12-07 LAB — POCT URINALYSIS DIPSTICK
Blood, UA: NEGATIVE
Glucose, UA: NEGATIVE
Ketones, UA: NEGATIVE
Nitrite, UA: POSITIVE
Protein, UA: NEGATIVE

## 2023-12-07 LAB — POCT WET PREP (WET MOUNT)
Clue Cells Wet Prep Whiff POC: NEGATIVE
WBC, Wet Prep HPF POC: POSITIVE

## 2023-12-07 MED ORDER — NITROFURANTOIN MONOHYD MACRO 100 MG PO CAPS: 100.0000 mg | ORAL_CAPSULE | Freq: Two times a day (BID) | ORAL | 0 refills | Status: AC

## 2023-12-07 MED ORDER — METRONIDAZOLE 500 MG PO TABS: 500.0000 mg | ORAL_TABLET | Freq: Two times a day (BID) | ORAL | 0 refills | Status: AC

## 2023-12-07 NOTE — Progress Notes (Signed)
 Subjective:     Patient ID: Nancy Oliver, female   DOB: October 04, 1960, 63 y.o.   MRN: [id number]  HPI Nancy Oliver is a 63 year old white female, marred, PM in complaining of strong odor in urine with white flakes floating in it, cramping, and vaginal discharge.      Component Value Date/Time   DIAGPAP  06/19/2021 1117    - Negative for intraepithelial lesion or malignancy (NILM)   HPVHIGH Negative 06/19/2021 1117   ADEQPAP  06/19/2021 1117    Satisfactory for evaluation; transformation zone component ABSENT.    PCP is N Redmon PA.  Review of Systems  +strong odor in urine with white flakes floating in it,  Urinary frequency +cramping, + vaginal discharge.    Denies any vaginal bleeding,itching or burning Not sexually active Reviewed past medical,surgical, social and family history. Reviewed medications and allergies.  Objective:   Physical Exam BP (!) 140/80 (BP Location: Left Arm, Patient Position: Sitting, Cuff Size: Large)   Pulse (!) 58   Ht 5' 5 (1.651 m)   Wt 284 lb (128.8 kg)   LMP 10/07/2012   BMI 47.26 kg/m  urine dipstick trace leuks, +nitrates Skin warm and dry.Pelvic: external genitalia is normal in appearance no lesions, vagina: white discharge without odor,urethra has no lesions or masses noted, cervix:smooth, uterus: normal size, shape and contour, non tender, no masses felt, adnexa: no masses or tenderness noted. Bladder is non tender and no masses felt. Wet prep: + for clue cells and +WBCs. Fall risk is low  Upstream - 12/07/23 1409       Pregnancy Intention Screening   Does the patient want to become pregnant in the next year? N/A    Does the patient's partner want to become pregnant in the next year? N/A    Would the patient like to discuss contraceptive options today? N/A      Contraception Wrap Up   Current Method Abstinence   PM   End Method Abstinence   PM   Contraception Counseling Provided No           Examination chaperoned by Clarita Salt LPN  and Vernell Ruddle CNM student     Assessment:     1. Abnormal urine odor (Primary) UA S&S sent Rx sent for Macrobid  had + nitrates  - POCT Urinalysis Dipstick - Urine Culture - Urinalysis, Routine w reflex microscopic  2. Urinary frequency UA C&S sent  - POCT Urinalysis Dipstick - Urine Culture - Urinalysis, Routine w reflex microscopic  3. Vaginal discharge +creamy discharge,no odor  - POCT Wet Prep [facility])  4. BV (bacterial vaginosis) Rx flagyl, no alcohol while taking  - POCT Wet Prep [facility])  5. Suspected UTI Will rx macrobid  and push fluids Meds ordered this encounter  Medications   metroNIDAZOLE (FLAGYL) 500 MG tablet    Sig: Take 1 tablet (500 mg total) by mouth 2 (two) times daily.    Dispense:  14 tablet    Refill:  0    Supervising Provider:   JAYNE MINDER H [[id number]]   nitrofurantoin , macrocrystal-monohydrate, (MACROBID ) 100 MG capsule    Sig: Take 1 capsule (100 mg total) by mouth 2 (two) times daily.    Dispense:  14 capsule    Refill:  0    Supervising Provider:   JAYNE, LUTHER H [[id number]]     6. Abdominal cramps +cramping at times      Plan:     Follow up prn

## 2023-12-08 ENCOUNTER — Ambulatory Visit: Payer: Self-pay | Admitting: Adult Health

## 2023-12-08 LAB — URINALYSIS, ROUTINE W REFLEX MICROSCOPIC
Bilirubin, UA: NEGATIVE
Glucose, UA: NEGATIVE
Ketones, UA: NEGATIVE
Nitrite, UA: POSITIVE — AB
Protein,UA: NEGATIVE
RBC, UA: NEGATIVE
Specific Gravity, UA: 1.017 (ref 1.005–1.030)
Urobilinogen, Ur: 0.2 mg/dL (ref 0.2–1.0)
pH, UA: 6 (ref 5.0–7.5)

## 2023-12-08 LAB — MICROSCOPIC EXAMINATION
Casts: NONE SEEN /LPF
Epithelial Cells (non renal): >10 /HPF — AB (ref 0–10)
RBC, Urine: NONE SEEN /HPF (ref 0–2)

## 2023-12-10 LAB — URINE CULTURE

## 2023-12-31 DIAGNOSIS — H04123 Dry eye syndrome of bilateral lacrimal glands: Secondary | ICD-10-CM | POA: Diagnosis not present

## 2023-12-31 DIAGNOSIS — H02421 Myogenic ptosis of right eyelid: Secondary | ICD-10-CM | POA: Diagnosis not present

## 2023-12-31 DIAGNOSIS — H02423 Myogenic ptosis of bilateral eyelids: Secondary | ICD-10-CM | POA: Diagnosis not present

## 2023-12-31 DIAGNOSIS — H02422 Myogenic ptosis of left eyelid: Secondary | ICD-10-CM | POA: Diagnosis not present

## 2023-12-31 DIAGNOSIS — H57813 Brow ptosis, bilateral: Secondary | ICD-10-CM | POA: Diagnosis not present

## 2023-12-31 DIAGNOSIS — H0279 Other degenerative disorders of eyelid and periocular area: Secondary | ICD-10-CM | POA: Diagnosis not present

## 2023-12-31 DIAGNOSIS — H2513 Age-related nuclear cataract, bilateral: Secondary | ICD-10-CM | POA: Diagnosis not present

## 2023-12-31 DIAGNOSIS — H53483 Generalized contraction of visual field, bilateral: Secondary | ICD-10-CM | POA: Diagnosis not present

## 2024-01-18 ENCOUNTER — Other Ambulatory Visit: Payer: Self-pay | Admitting: Physician Assistant

## 2024-01-18 DIAGNOSIS — Z1231 Encounter for screening mammogram for malignant neoplasm of breast: Secondary | ICD-10-CM

## 2024-01-22 ENCOUNTER — Ambulatory Visit
Admission: RE | Admit: 2024-01-22 | Discharge: 2024-01-22 | Disposition: A | Discharge status: Home / Self Care | Source: Ambulatory Visit | Attending: Physician Assistant | Admitting: Physician Assistant

## 2024-01-22 DIAGNOSIS — Z1231 Encounter for screening mammogram for malignant neoplasm of breast: Secondary | ICD-10-CM | POA: Diagnosis not present

## 2024-02-09 DIAGNOSIS — H16291 Other keratoconjunctivitis, right eye: Secondary | ICD-10-CM | POA: Diagnosis not present

## 2024-02-11 DIAGNOSIS — H16201 Unspecified keratoconjunctivitis, right eye: Secondary | ICD-10-CM | POA: Diagnosis not present

## 2024-02-18 DIAGNOSIS — H16201 Unspecified keratoconjunctivitis, right eye: Secondary | ICD-10-CM | POA: Diagnosis not present

## 2024-03-02 IMAGING — US US BREAST*R* LIMITED INC AXILLA
1 series · 5 of 5 positions shown · non-contrast
Comparison: Previous exam(s).

CLINICAL DATA: Recall from screening to evaluate a right breast
mass.

EXAM:
ULTRASOUND OF THE RIGHT BREAST

[Series 1: us breast*right* limited inc axilla · 0.05mm/px · 5 of 5 slices shown]
[im 1/5]
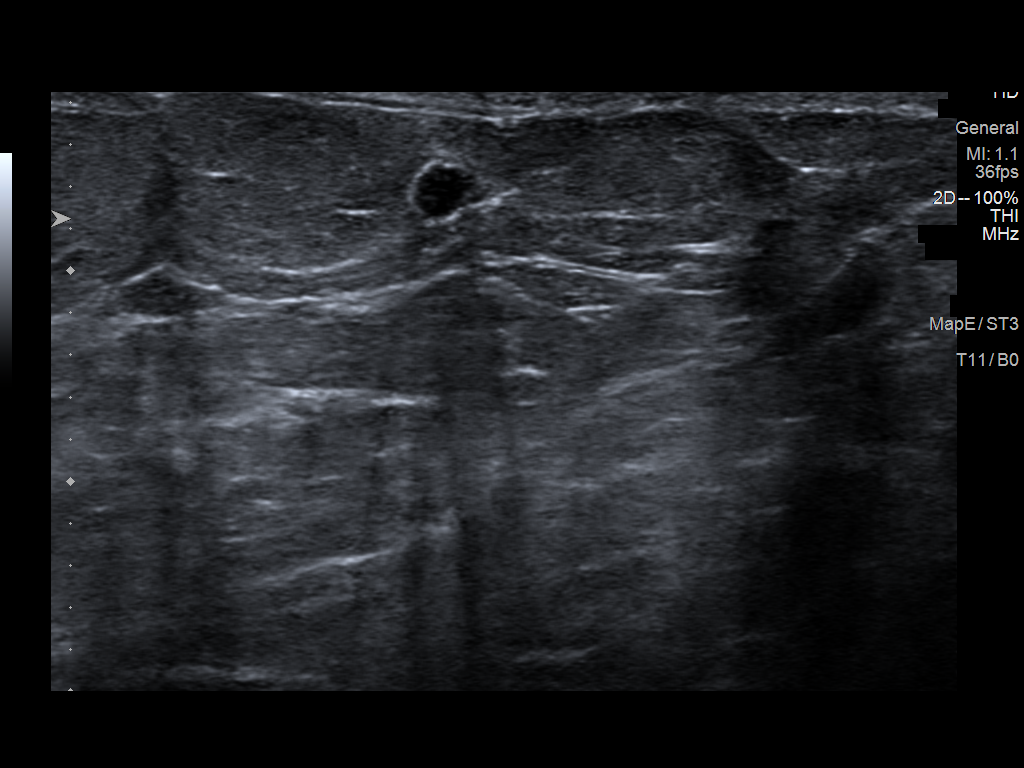
[im 2/5]
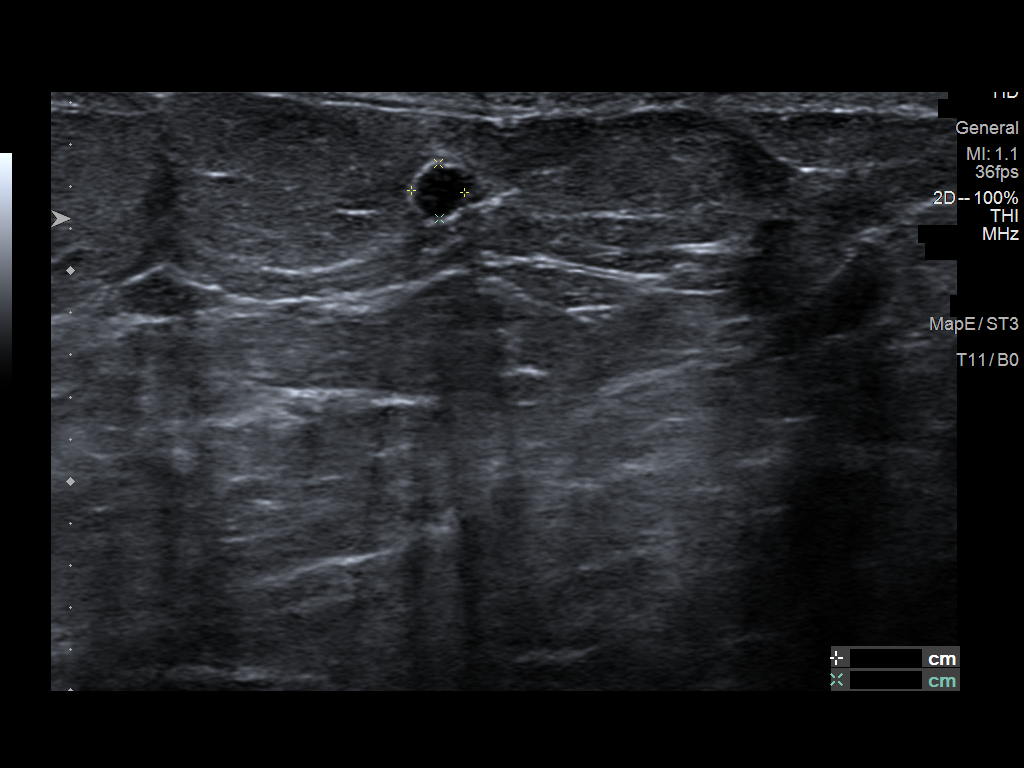
[im 3/5]
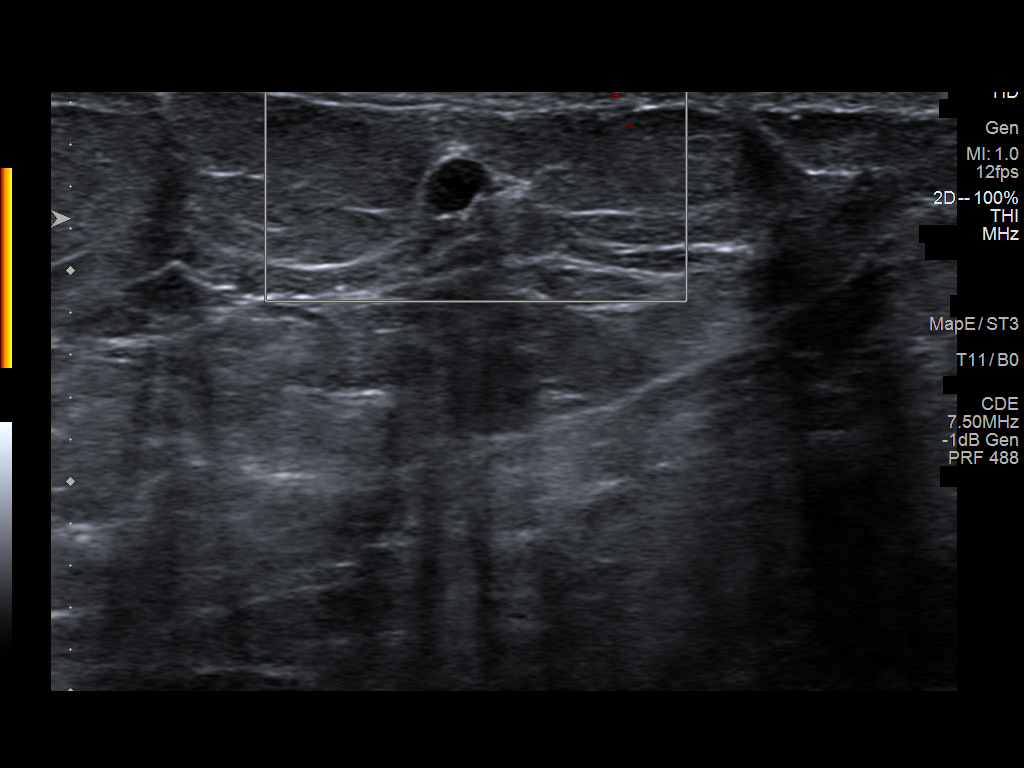
[im 4/5]
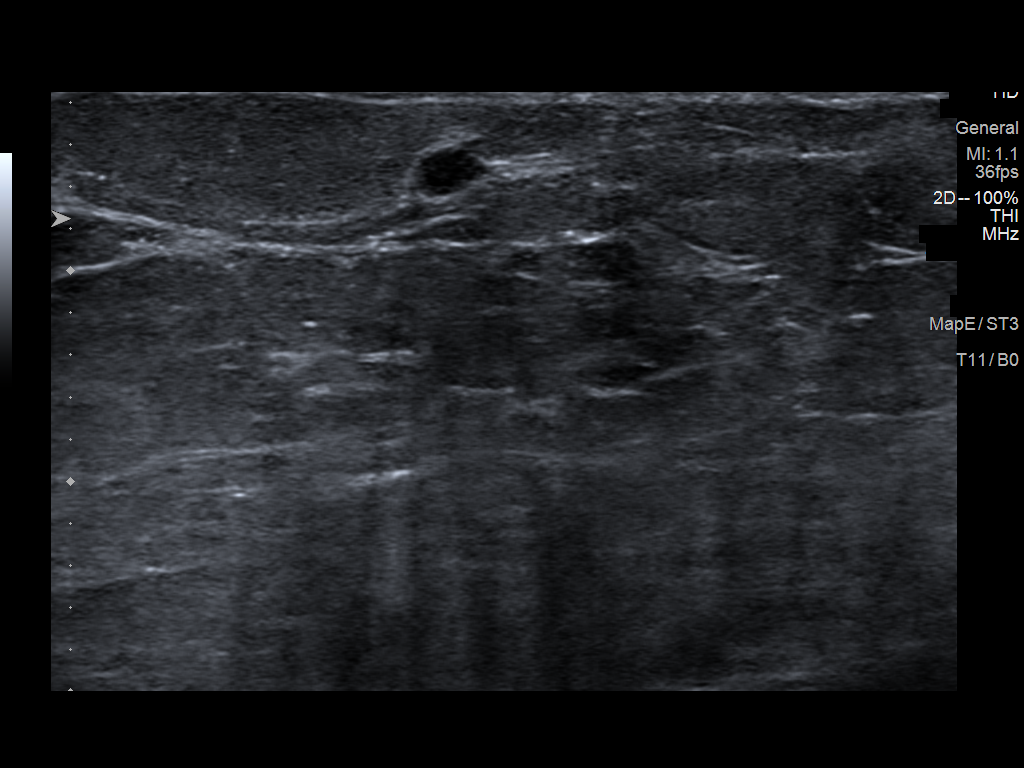
[im 5/5]
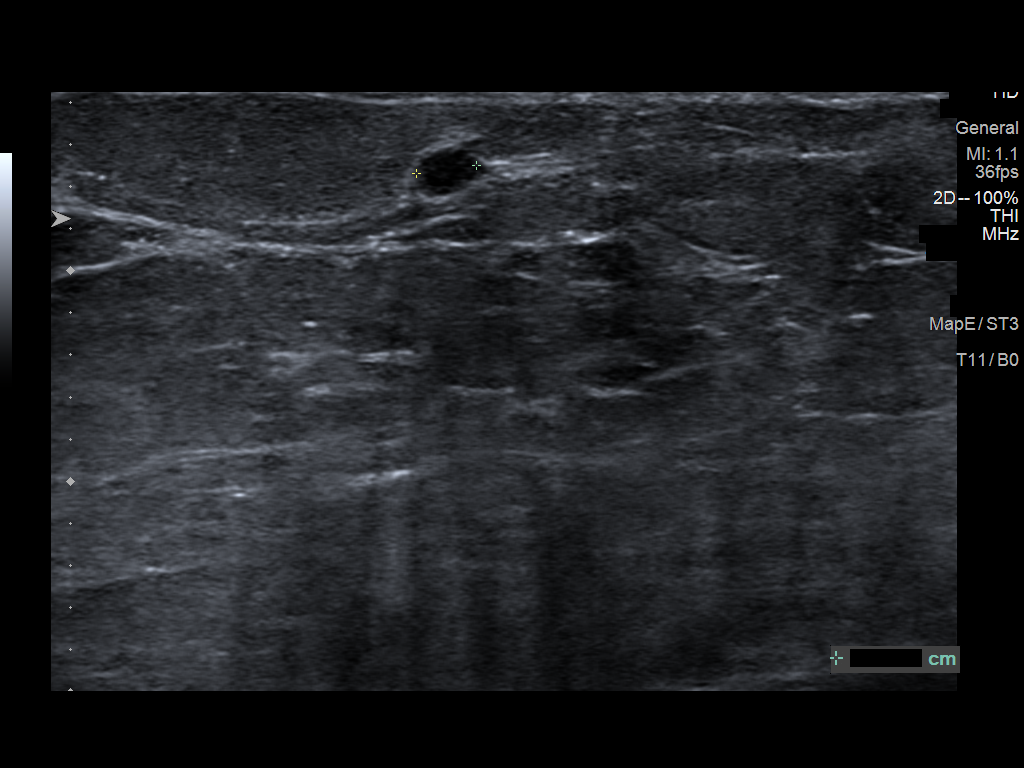

[5 of 5 positions shown; findings below may reference images not displayed]

FINDINGS: Targeted ultrasound is performed, showing a round circumscribed
superficial hypoechoic mass at the 11 o'clock position of the right
breast 1 cm from the nipple measuring 3 x 3 x 3 mm. This corresponds
to the mammographic finding and likely represents a benign process
such as a complicated cyst or focus of apocrine metaplasia.
IMPRESSION: Probable benign 3 mm mass over the 11 o'clock position of the right
breast.

RECOMMENDATION:
Recommend six-month follow-up diagnostic right breast ultrasound to
document stability.

I have discussed the findings and recommendations with the patient.
If applicable, a reminder letter will be sent to the patient
regarding the next appointment.

BI-RADS CATEGORY  3: Probably benign.

## 2024-03-25 DIAGNOSIS — E1169 Type 2 diabetes mellitus with other specified complication: Secondary | ICD-10-CM | POA: Diagnosis not present

## 2024-04-20 DIAGNOSIS — J40 Bronchitis, not specified as acute or chronic: Secondary | ICD-10-CM | POA: Diagnosis not present

## 2024-04-20 DIAGNOSIS — R079 Chest pain, unspecified: Secondary | ICD-10-CM | POA: Diagnosis not present

## 2024-04-20 DIAGNOSIS — Z20822 Contact with and (suspected) exposure to covid-19: Secondary | ICD-10-CM | POA: Diagnosis not present

## 2024-04-27 ENCOUNTER — Telehealth: Payer: Self-pay | Admitting: Cardiology

## 2024-04-27 NOTE — Telephone Encounter (Signed)
 Pt c/o of Chest Pain: STAT if active (IN THIS MOMENT) CP, including tightness, pressure, jaw pain, shoulder/upper arm/back pain, SOB, nausea, and vomiting.  1. Are you having CP right now (tightness, pressure, or discomfort)? No  2. Are you experiencing any other symptoms (ex. SOB, nausea, vomiting, sweating)? Sweating, nausea, SOB  3. How long have you been experiencing CP? A few weeks  4. Is your CP continuous or coming and going? Coming and going   5. Have you taken Nitroglycerin ? No  6. If CP returns before callback, please consider calling 911. ?

## 2024-04-27 NOTE — Telephone Encounter (Signed)
 I spoke with patient.  She reports being diagnosed with bronchitis last week.  Today she is short of breath, sweating, having chest pain which she describes as an ache, fluttering in her chest and feels lightheaded.  I advised patient to call 911

## 2024-06-12 ENCOUNTER — Other Ambulatory Visit: Payer: Self-pay

## 2024-06-12 ENCOUNTER — Emergency Department (HOSPITAL_COMMUNITY)
Admission: EM | Admit: 2024-06-12 | Discharge: 2024-06-12 | Disposition: A | Discharge status: Home / Self Care | Attending: Emergency Medicine | Admitting: Emergency Medicine

## 2024-06-12 ENCOUNTER — Emergency Department (HOSPITAL_COMMUNITY)

## 2024-06-12 ENCOUNTER — Encounter (HOSPITAL_COMMUNITY): Payer: Self-pay | Admitting: *Deleted

## 2024-06-12 DIAGNOSIS — W19XXXA Unspecified fall, initial encounter: Secondary | ICD-10-CM

## 2024-06-12 DIAGNOSIS — W010XXA Fall on same level from slipping, tripping and stumbling without subsequent striking against object, initial encounter: Secondary | ICD-10-CM | POA: Insufficient documentation

## 2024-06-12 DIAGNOSIS — I10 Essential (primary) hypertension: Secondary | ICD-10-CM | POA: Diagnosis not present

## 2024-06-12 DIAGNOSIS — Z7982 Long term (current) use of aspirin: Secondary | ICD-10-CM | POA: Insufficient documentation

## 2024-06-12 DIAGNOSIS — S0181XA Laceration without foreign body of other part of head, initial encounter: Secondary | ICD-10-CM | POA: Diagnosis not present

## 2024-06-12 DIAGNOSIS — S0990XA Unspecified injury of head, initial encounter: Secondary | ICD-10-CM | POA: Diagnosis present

## 2024-06-12 DIAGNOSIS — E119 Type 2 diabetes mellitus without complications: Secondary | ICD-10-CM | POA: Diagnosis not present

## 2024-06-12 DIAGNOSIS — Z7984 Long term (current) use of oral hypoglycemic drugs: Secondary | ICD-10-CM | POA: Insufficient documentation

## 2024-06-12 DIAGNOSIS — Z79899 Other long term (current) drug therapy: Secondary | ICD-10-CM | POA: Diagnosis not present

## 2024-06-12 MED ORDER — LIDOCAINE HCL (PF) 1 % IJ SOLN
5.0000 mL | Freq: Once | INTRAMUSCULAR | Status: DC
  Filled 2024-06-12: qty 5

## 2024-06-12 NOTE — Discharge Instructions (Signed)
 Keep stitches clean and dry.  May take Tylenol  or ibuprofen  every 6 hours as needed for headaches.  Apply ice packs to the forehead to help with swelling.  Please follow-up with primary care doctor in 1 week for recheck and to have stitches removed.  Return to ED if any symptoms worsen including new falls, severe headache, uncontrolled nausea/vomiting, signs of infection from the wound.

## 2024-06-12 NOTE — ED Provider Notes (Signed)
 " [facility] Provider Note   CSN: [id number] Arrival date & time: 06/12/24  1736     Patient presents with: Nancy Oliver is a 64 y.o. female.  Patient is a 64 year old female with a history of hypertension, dyslipidemia, type 2 diabetes, and A-fib who presents to the ED for a fall that occurred just prior to arrival.  Patient notes she tripped on her porch and fell forward landing on her face.  States she was not dizzy before she fell.  Denies loss of consciousness or any nausea/vomiting.  Notes her head is hurting and she is having some tingling in the hands.  Denies dizziness, syncope, chest pain, shortness of breath.  No other complaints.    Fall Associated symptoms include headaches. Pertinent negatives include no chest pain and no shortness of breath.       Prior to Admission medications  Medication Sig Start Date End Date Taking? Authorizing Provider  ACCU-CHEK AVIVA PLUS test strip  04/13/20   [provider]  Accu-Chek Softclix Lancets lancets  10/14/22   [provider]  allopurinol  (ZYLOPRIM ) 300 MG tablet Take 300 mg by mouth daily as needed (gout flare). 08/20/18   [provider]  ALPRAZolam  (XANAX ) 0.5 MG tablet TAKE ONE TABLET BY MOUTH EVERY 8 HOURS AS NEEDED 06/17/13   Duanne Butler DASEN, MD  aspirin  325 MG tablet Take 325 mg by mouth daily.    [provider]  BD PEN NEEDLE NANO 2ND GEN 32G X 4 MM MISC USE 1 PEN NEEDLE THREE TIMES DAILY 10/14/22   [provider]  Blood Glucose Monitoring Suppl (ACCU-CHEK AVIVA PLUS) w/Device KIT See admin instructions. 11/25/16   [provider]  diltiazem  (CARDIZEM  CD) 120 MG 24 hr capsule Take 1 capsule (120 mg total) by mouth daily. 08/19/23   Lavona Agent, MD  Dulaglutide (TRULICITY) 3 MG/0.5ML SOPN Inject 3 mg into the skin every Sunday.    [provider]  DULoxetine  (CYMBALTA ) 60 MG capsule Take 60 mg by mouth daily as  needed (anxiety). 07/31/20   [provider]  furosemide  (LASIX ) 40 MG tablet Take 1 tablet (40 mg total) by mouth daily. X 7 days, then you may resume 20 mg daily prn edema 08/17/22   Barrett, Erin R, PA-C  gabapentin  (NEURONTIN ) 300 MG capsule 1 capsule in the am and noon, then 2 capsules at bedtime Orally for 100 days 05/29/23   [provider]  metFORMIN  (GLUCOPHAGE ) 1000 MG tablet Take 1,000 mg by mouth 2 (two) times daily with a meal. 08/16/18   [provider]  metoprolol  tartrate (LOPRESSOR ) 25 MG tablet  08/17/22   [provider]  metroNIDAZOLE  (FLAGYL ) 500 MG tablet Take 1 tablet (500 mg total) by mouth 2 (two) times daily. 12/07/23   Signa Delon LABOR, NP  nitrofurantoin , macrocrystal-monohydrate, (MACROBID ) 100 MG capsule Take 1 capsule (100 mg total) by mouth 2 (two) times daily. 12/07/23   Signa Delon LABOR, NP  norethindrone  (MICRONOR ) 0.35 MG tablet Take 1 tablet (0.35 mg total) by mouth daily. 09/17/23 12/16/23  Ozan, Jennifer, DO  omeprazole (PRILOSEC) 40 MG capsule Take 40 mg by mouth daily as needed (acid reflux).    [provider]  Polyethyl Glycol-Propyl Glycol (SYSTANE OP) Place 1 drop into both eyes daily.    [provider]  rosuvastatin (CRESTOR) 40 MG tablet Take 40 mg by mouth daily.    [provider]  [facility]  FLEXTOUCH 100 UNIT/ML SOPN FlexTouch Pen Inject 25 Units into the skin daily. 08/02/18   [provider]  metoprolol  tartrate (LOPRESSOR ) 25 MG tablet Take 1 tablet (25 mg total) by mouth 2 (two) times daily. Patient taking differently: Take 37.5 mg by mouth 2 (two) times daily. Take 1.5 Tablet (37.5 MG) by mouth two times daily. 08/17/22   Barrett, Rocky SAUNDERS, PA-C    Allergies: Patient has no known allergies.    Review of Systems  Constitutional:  Negative for fever.  Respiratory:  Negative for shortness of breath.   Cardiovascular:  Negative for chest pain.  Neurological:  Positive for headaches.  Negative for dizziness and syncope.  All other systems reviewed and are negative.   Updated Vital Signs BP 136/77   Pulse 76   Temp 98.5 F (36.9 C) (Oral)   Resp 17   Ht 5' 5 (1.651 m)   Wt 122.9 kg   LMP 10/07/2012   SpO2 94%   BMI 45.10 kg/m   Physical Exam Constitutional:      Appearance: Normal appearance.  HENT:     Head: Normocephalic.     Comments: 4 cm vertical laceration present on central forehead.  No active bleeding or drainage.    Mouth/Throat:     Mouth: Mucous membranes are moist.     Pharynx: Oropharynx is clear.  Eyes:     Extraocular Movements: Extraocular movements intact.     Pupils: Pupils are equal, round, and reactive to light.  Cardiovascular:     Rate and Rhythm: Normal rate.     Pulses: Normal pulses.     Comments: Radial pulses 2+ bilaterally Pulmonary:     Effort: Pulmonary effort is normal.  Musculoskeletal:        General: Normal range of motion.     Cervical back: Normal range of motion and neck supple. No tenderness.     Comments: Equal handgrip  Skin:    General: Skin is warm and dry.  Neurological:     Mental Status: She is alert and oriented to person, place, and time.     Cranial Nerves: No cranial nerve deficit.  Psychiatric:        Mood and Affect: Mood normal.        Behavior: Behavior normal.     (all labs ordered are listed, but only abnormal results are displayed) Labs Reviewed - No data to display  EKG: None  Radiology: CT Cervical Spine Wo Contrast Result Date: 06/12/2024 EXAM: CT CERVICAL SPINE WITHOUT CONTRAST 06/12/2024 06:46:56 PM TECHNIQUE: CT of the cervical spine was performed without the administration of intravenous contrast. Multiplanar reformatted images are provided for review. Automated exposure control, iterative reconstruction, and/or weight based adjustment of the mA/kV was utilized to reduce the radiation dose to as low as reasonably achievable. COMPARISON: None available. CLINICAL HISTORY: Neck  trauma, midline tenderness (Age 77-65y). Tripped and fell. Hit head on concrete. Frontal scalp laceration. FINDINGS: BONES AND ALIGNMENT: No acute fracture or traumatic malalignment. . . DEGENERATIVE CHANGES: Uncovertebral spurring contributes to moderate left foraminal narrowing at C4-C5. Uncovertebral spurring contributes to bilateral foraminal narrowing at C5-C6. Uncovertebral spurring contributes to bilateral foraminal narrowing at C6-C7, right greater than left. SOFT TISSUES: No prevertebral soft tissue swelling. IMPRESSION: 1. No acute fracture or traumatic malalignment. 2. . Degenerative changes of the cervical spine as described . Electronically signed by: Lonni Necessary MD 06/12/2024 07:09 PM EST RP Workstation: [id number]   CT Head Wo Contrast Result Date: 06/12/2024 EXAM:  CT HEAD WITHOUT CONTRAST 06/12/2024 06:46:56 PM TECHNIQUE: CT of the head was performed without the administration of intravenous contrast. Automated exposure control, iterative reconstruction, and/or weight based adjustment of the mA/kV was utilized to reduce the radiation dose to as low as reasonably achievable. COMPARISON: None available. CLINICAL HISTORY: Head trauma, moderate-severe. Tripped and fell. Hit head on cement porch. FINDINGS: BRAIN AND VENTRICLES: No acute hemorrhage. No evidence of acute infarct. No hydrocephalus. No extra-axial collection. No mass effect or midline shift. ORBITS: No acute abnormality. SINUSES: No acute abnormality. SOFT TISSUES AND SKULL: Frontal scalp soft tissue swelling with laceration and hematoma. No underlying fracture or radiopaque foreign body is present. IMPRESSION: 1. Frontal scalp soft tissue swelling with laceration and hematoma without underlying fracture or radiopaque foreign body. 2. No acute intracranial abnormality. Electronically signed by: Lonni Necessary MD 06/12/2024 07:04 PM EST RP Workstation: [id number]     .Laceration Repair  Date/Time: 06/12/2024 11:19  PM  Performed by: Neysa Thersia RAMAN, PA-C Authorized by: Neysa Thersia RAMAN, PA-C   Consent:    Consent obtained:  Verbal   Consent given by:  Patient   Risks discussed:  Infection Universal protocol:    Patient identity confirmed:  Verbally with patient Anesthesia:    Anesthesia method:  Local infiltration   Local anesthetic:  Lidocaine  1% w/o epi Laceration details:    Location:  Face   Face location:  Forehead   Length (cm):  4   Depth (mm):  1 Pre-procedure details:    Preparation:  Patient was prepped and draped in usual sterile fashion Exploration:    Hemostasis achieved with:  Direct pressure Treatment:    Area cleansed with:  Povidone-iodine   Amount of cleaning:  Standard   Irrigation solution:  Sterile saline   Irrigation volume:  20cc   Irrigation method:  Syringe Skin repair:    Repair method:  Sutures   Suture size:  4-0   Suture material:  Prolene   Suture technique:  Simple interrupted   Number of sutures:  6 Approximation:    Approximation:  Close Repair type:    Repair type:  Simple Post-procedure details:    Dressing:  Non-adherent dressing   Procedure completion:  Tolerated well, no immediate complications    Medications Ordered in the ED  lidocaine  (PF) (XYLOCAINE ) 1 % injection 5 mL (has no administration in time range)                                   Medical Decision Making Patient is a 64 year old female who presents to the ED for evaluation of a head injury after a fall that occurred just prior to arrival.  Please see detailed HPI above.  On exam patient is alert and in no acute distress.  Physical exam as noted above.  No acute neurologic deficits noted.  She is not on blood thinners.  She does have a laceration to the forehead with surrounding hematoma.  CT of the head and neck reviewed and negative for acute process.  There are some degenerative changes of the cervical spine that could be causing some tingling in the hands but she is  otherwise neurovascularly intact.  No concerns for further emergent workup at this time.  Wound was irrigated thoroughly and repaired with 6 sutures.  States tetanus was updated last week.  Otherwise stable for discharge home.  Wound care instructions provided.  Advised PCP follow-up in  1 week for recheck and to have sutures removed.  Return precautions provided.  Amount and/or Complexity of Data Reviewed Radiology: ordered.  Risk Prescription drug management.       Final diagnoses:  Fall, initial encounter  Laceration of forehead, initial encounter    ED Discharge Orders     None          Neysa Thersia GORMAN DEVONNA 06/12/24 [id number]    Francesca Elsie CROME, MD 06/12/24 [id number]  "

## 2024-06-12 NOTE — ED Triage Notes (Signed)
 Pt fell after tripping over a step and fell forward on cement porch.  Pt with lac to forehead, bleeding controlled. Denies LOC and is not on Eliquis  anymore but takes ASA 81 mg daily. Pt denies N/V or blurred vision. Right hand is tingling since fall.

## 2024-09-28 ENCOUNTER — Ambulatory Visit: Admitting: Cardiology
